# Patient Record
Sex: Male | Born: 1940
Health system: Southern US, Community
[De-identification: ages and names within clinical notes are randomized; demographics above are authoritative.]

## PROBLEM LIST (undated history)

## (undated) DIAGNOSIS — I509 Heart failure, unspecified: Secondary | ICD-10-CM

## (undated) DIAGNOSIS — N183 Chronic kidney disease, stage 3 unspecified: Secondary | ICD-10-CM

## (undated) DIAGNOSIS — Z9889 Other specified postprocedural states: Secondary | ICD-10-CM

## (undated) DIAGNOSIS — I5022 Chronic systolic (congestive) heart failure: Secondary | ICD-10-CM

## (undated) DIAGNOSIS — E039 Hypothyroidism, unspecified: Secondary | ICD-10-CM

## (undated) DIAGNOSIS — R188 Other ascites: Secondary | ICD-10-CM

## (undated) DIAGNOSIS — I2510 Atherosclerotic heart disease of native coronary artery without angina pectoris: Secondary | ICD-10-CM

## (undated) DIAGNOSIS — K429 Umbilical hernia without obstruction or gangrene: Secondary | ICD-10-CM

## (undated) DIAGNOSIS — J189 Pneumonia, unspecified organism: Secondary | ICD-10-CM

## (undated) DIAGNOSIS — I2699 Other pulmonary embolism without acute cor pulmonale: Secondary | ICD-10-CM

## (undated) DIAGNOSIS — J42 Unspecified chronic bronchitis: Secondary | ICD-10-CM

## (undated) DIAGNOSIS — I071 Rheumatic tricuspid insufficiency: Secondary | ICD-10-CM

## (undated) DIAGNOSIS — G14 Postpolio syndrome: Secondary | ICD-10-CM

## (undated) DIAGNOSIS — Z515 Encounter for palliative care: Secondary | ICD-10-CM

## (undated) DIAGNOSIS — E038 Other specified hypothyroidism: Secondary | ICD-10-CM

## (undated) DIAGNOSIS — R011 Cardiac murmur, unspecified: Secondary | ICD-10-CM

## (undated) DIAGNOSIS — E7800 Pure hypercholesterolemia, unspecified: Secondary | ICD-10-CM

## (undated) DIAGNOSIS — I4891 Unspecified atrial fibrillation: Secondary | ICD-10-CM

## (undated) DIAGNOSIS — E113599 Type 2 diabetes mellitus with proliferative diabetic retinopathy without macular edema, unspecified eye: Secondary | ICD-10-CM

## (undated) DIAGNOSIS — I10 Essential (primary) hypertension: Secondary | ICD-10-CM

## (undated) DIAGNOSIS — R7611 Nonspecific reaction to tuberculin skin test without active tuberculosis: Secondary | ICD-10-CM

## (undated) DIAGNOSIS — K7460 Unspecified cirrhosis of liver: Secondary | ICD-10-CM

## (undated) DIAGNOSIS — I340 Nonrheumatic mitral (valve) insufficiency: Secondary | ICD-10-CM

## (undated) DIAGNOSIS — E119 Type 2 diabetes mellitus without complications: Secondary | ICD-10-CM

## (undated) HISTORY — DX: Unspecified atrial fibrillation: I48.91

## (undated) HISTORY — DX: Unspecified cirrhosis of liver: K74.60

## (undated) HISTORY — DX: Postpolio syndrome: G14

## (undated) HISTORY — PX: TRANSTHORACIC ECHOCARDIOGRAM: SHX275

## (undated) HISTORY — PX: CORONARY ARTERY BYPASS GRAFT: SHX141

## (undated) HISTORY — PX: PARACENTESIS: SHX844

## (undated) HISTORY — PX: HYDROCELE EXCISION / REPAIR: SUR1145

## (undated) HISTORY — PX: CARDIAC CATHETERIZATION: SHX172

## (undated) HISTORY — DX: Chronic kidney disease, stage 3 (moderate): N18.3

## (undated) HISTORY — DX: Encounter for palliative care: Z51.5

## (undated) HISTORY — PX: RETINAL LASER PROCEDURE: SHX2339

## (undated) HISTORY — DX: Other specified postprocedural states: Z98.890

## (undated) HISTORY — DX: Other ascites: R18.8

## (undated) HISTORY — DX: Nonrheumatic mitral (valve) insufficiency: I34.0

## (undated) HISTORY — DX: Type 2 diabetes mellitus with proliferative diabetic retinopathy without macular edema, unspecified eye: E11.3599

## (undated) HISTORY — DX: Other specified hypothyroidism: E03.8

## (undated) HISTORY — DX: Chronic systolic (congestive) heart failure: I50.22

## (undated) HISTORY — PX: APPENDECTOMY: SHX54

## (undated) HISTORY — DX: Chronic kidney disease, stage 3 unspecified: N18.30

## (undated) HISTORY — DX: Rheumatic tricuspid insufficiency: I07.1

## (undated) HISTORY — PX: EXPLORATORY LAPAROTOMY: SUR591

## (undated) HISTORY — DX: Umbilical hernia without obstruction or gangrene: K42.9

## (undated) HISTORY — DX: Hypothyroidism, unspecified: E03.9

---
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
## 1982-10-21 HISTORY — PX: CERVICAL DISCECTOMY: SHX98

## 2003-02-18 ENCOUNTER — Encounter: Payer: Self-pay | Admitting: Emergency Medicine

## 2003-02-18 ENCOUNTER — Inpatient Hospital Stay (HOSPITAL_COMMUNITY): Admission: EM | Admit: 2003-02-18 | Discharge: 2003-02-22 | Payer: Self-pay | Admitting: Emergency Medicine

## 2003-02-20 ENCOUNTER — Encounter: Payer: Self-pay | Admitting: Cardiology

## 2003-10-22 HISTORY — PX: CATARACT EXTRACTION W/ INTRAOCULAR LENS  IMPLANT, BILATERAL: SHX1307

## 2004-03-09 ENCOUNTER — Encounter: Admission: RE | Admit: 2004-03-09 | Discharge: 2004-03-09 | Payer: Self-pay | Admitting: Cardiovascular Disease

## 2006-12-02 ENCOUNTER — Inpatient Hospital Stay (HOSPITAL_COMMUNITY): Admission: EM | Admit: 2006-12-02 | Discharge: 2006-12-05 | Payer: Self-pay | Admitting: Emergency Medicine

## 2007-06-05 ENCOUNTER — Encounter (HOSPITAL_COMMUNITY): Admission: RE | Admit: 2007-06-05 | Discharge: 2007-06-08 | Payer: Self-pay | Admitting: Cardiovascular Disease

## 2007-10-29 ENCOUNTER — Inpatient Hospital Stay (HOSPITAL_COMMUNITY): Admission: EM | Admit: 2007-10-29 | Discharge: 2007-10-31 | Payer: Self-pay | Admitting: Emergency Medicine

## 2010-03-01 ENCOUNTER — Encounter: Admission: RE | Admit: 2010-03-01 | Discharge: 2010-03-01 | Payer: Self-pay | Admitting: Cardiovascular Disease

## 2011-02-14 ENCOUNTER — Inpatient Hospital Stay (HOSPITAL_COMMUNITY)
Admission: AD | Admit: 2011-02-14 | Discharge: 2011-02-19 | DRG: 287 | Disposition: A | Discharge status: Home / Self Care | Payer: Medicare Other | Source: Ambulatory Visit | Attending: Cardiovascular Disease | Admitting: Cardiovascular Disease

## 2011-02-14 ENCOUNTER — Inpatient Hospital Stay (HOSPITAL_COMMUNITY): Payer: Medicare Other

## 2011-02-14 DIAGNOSIS — I428 Other cardiomyopathies: Secondary | ICD-10-CM | POA: Diagnosis present

## 2011-02-14 DIAGNOSIS — I509 Heart failure, unspecified: Secondary | ICD-10-CM | POA: Diagnosis present

## 2011-02-14 DIAGNOSIS — I2789 Other specified pulmonary heart diseases: Secondary | ICD-10-CM | POA: Diagnosis present

## 2011-02-14 DIAGNOSIS — I5023 Acute on chronic systolic (congestive) heart failure: Principal | ICD-10-CM | POA: Diagnosis present

## 2011-02-14 DIAGNOSIS — Z951 Presence of aortocoronary bypass graft: Secondary | ICD-10-CM

## 2011-02-14 DIAGNOSIS — E119 Type 2 diabetes mellitus without complications: Secondary | ICD-10-CM | POA: Diagnosis present

## 2011-02-14 DIAGNOSIS — I2510 Atherosclerotic heart disease of native coronary artery without angina pectoris: Secondary | ICD-10-CM | POA: Diagnosis present

## 2011-02-14 LAB — BRAIN NATRIURETIC PEPTIDE: Pro B Natriuretic peptide (BNP): 101 pg/mL — ABNORMAL HIGH (ref 0.0–100.0)

## 2011-02-14 LAB — DIFFERENTIAL
Basophils Relative: 1 % (ref 0–1)
Eosinophils Absolute: 0.2 10*3/uL (ref 0.0–0.7)
Eosinophils Relative: 4 % (ref 0–5)
Lymphs Abs: 0.8 10*3/uL (ref 0.7–4.0)
Monocytes Relative: 14 % — ABNORMAL HIGH (ref 3–12)
Neutrophils Relative %: 65 % (ref 43–77)

## 2011-02-14 LAB — COMPREHENSIVE METABOLIC PANEL
AST: 21 U/L (ref 0–37)
Albumin: 3.6 g/dL (ref 3.5–5.2)
BUN: 18 mg/dL (ref 6–23)
Calcium: 8.6 mg/dL (ref 8.4–10.5)
Chloride: 102 mEq/L (ref 96–112)
Creatinine, Ser: 1.2 mg/dL (ref 0.4–1.5)
Total Protein: 7.2 g/dL (ref 6.0–8.3)

## 2011-02-14 LAB — CARDIAC PANEL(CRET KIN+CKTOT+MB+TROPI)
CK, MB: 1.9 ng/mL (ref 0.3–4.0)
Total CK: 69 U/L (ref 7–232)

## 2011-02-14 LAB — CBC
MCH: 30.3 pg (ref 26.0–34.0)
MCV: 89.2 fL (ref 78.0–100.0)
Platelets: 115 10*3/uL — ABNORMAL LOW (ref 150–400)
RBC: 4.52 MIL/uL (ref 4.22–5.81)
RDW: 15.3 % (ref 11.5–15.5)

## 2011-02-14 LAB — MAGNESIUM: Magnesium: 2.1 mg/dL (ref 1.5–2.5)

## 2011-02-14 LAB — GLUCOSE, CAPILLARY: Glucose-Capillary: 141 mg/dL — ABNORMAL HIGH (ref 70–99)

## 2011-02-15 LAB — PROTIME-INR
INR: 1.14 (ref 0.00–1.49)
INR: 1.23 (ref 0.00–1.49)
Prothrombin Time: 14.8 seconds (ref 11.6–15.2)
Prothrombin Time: 15.7 seconds — ABNORMAL HIGH (ref 11.6–15.2)

## 2011-02-15 LAB — CBC
HCT: 38.2 % — ABNORMAL LOW (ref 39.0–52.0)
Hemoglobin: 13.2 g/dL (ref 13.0–17.0)
Hemoglobin: 13.3 g/dL (ref 13.0–17.0)
MCH: 29.8 pg (ref 26.0–34.0)
MCV: 87 fL (ref 78.0–100.0)
Platelets: 123 10*3/uL — ABNORMAL LOW (ref 150–400)
RBC: 4.43 MIL/uL (ref 4.22–5.81)
WBC: 5.3 10*3/uL (ref 4.0–10.5)
WBC: 6.6 10*3/uL (ref 4.0–10.5)

## 2011-02-15 LAB — CARDIAC PANEL(CRET KIN+CKTOT+MB+TROPI): Total CK: 51 U/L (ref 7–232)

## 2011-02-15 LAB — GLUCOSE, CAPILLARY
Glucose-Capillary: 119 mg/dL — ABNORMAL HIGH (ref 70–99)
Glucose-Capillary: 113 mg/dL — ABNORMAL HIGH (ref 70–99)
Glucose-Capillary: 98 mg/dL (ref 70–99)

## 2011-02-15 LAB — BASIC METABOLIC PANEL
BUN: 16 mg/dL (ref 6–23)
CO2: 24 mEq/L (ref 19–32)
Calcium: 8.4 mg/dL (ref 8.4–10.5)
Creatinine, Ser: 1.12 mg/dL (ref 0.4–1.5)
Creatinine, Ser: 1.07 mg/dL (ref 0.4–1.5)
GFR calc non Af Amer: >60 mL/min (ref >60)
GFR calc non Af Amer: >60 mL/min (ref >60)
Sodium: 132 mEq/L — ABNORMAL LOW (ref 135–145)

## 2011-02-15 LAB — POCT I-STAT 3, ART BLOOD GAS (G3+)
O2 Saturation: 98 %
pO2, Arterial: 99 mmHg (ref 80.0–100.0)

## 2011-02-15 LAB — POCT I-STAT 3, VENOUS BLOOD GAS (G3P V)
Acid-base deficit: 4 mmol/L — ABNORMAL HIGH (ref 0.0–2.0)
pCO2, Ven: 39.3 mmHg — ABNORMAL LOW (ref 45.0–50.0)
pO2, Ven: 32 mmHg (ref 30.0–45.0)

## 2011-02-16 LAB — GLUCOSE, CAPILLARY
Glucose-Capillary: 172 mg/dL — ABNORMAL HIGH (ref 70–99)
Glucose-Capillary: 199 mg/dL — ABNORMAL HIGH (ref 70–99)
Glucose-Capillary: 205 mg/dL — ABNORMAL HIGH (ref 70–99)

## 2011-02-16 LAB — CBC
Hemoglobin: 14.3 g/dL (ref 13.0–17.0)
MCHC: 34.5 g/dL (ref 30.0–36.0)
Platelets: 125 10*3/uL — ABNORMAL LOW (ref 150–400)
RDW: 15.1 % (ref 11.5–15.5)

## 2011-02-16 LAB — MAGNESIUM: Magnesium: 2.2 mg/dL (ref 1.5–2.5)

## 2011-02-16 LAB — BASIC METABOLIC PANEL
BUN: 20 mg/dL (ref 6–23)
Calcium: 8.7 mg/dL (ref 8.4–10.5)
Creatinine, Ser: 1.49 mg/dL (ref 0.4–1.5)
GFR calc non Af Amer: 47 mL/min — ABNORMAL LOW (ref >60)
Potassium: 4.8 mEq/L (ref 3.5–5.1)

## 2011-02-17 LAB — GLUCOSE, CAPILLARY: Glucose-Capillary: 166 mg/dL — ABNORMAL HIGH (ref 70–99)

## 2011-02-17 LAB — BASIC METABOLIC PANEL
BUN: 25 mg/dL — ABNORMAL HIGH (ref 6–23)
CO2: 26 mEq/L (ref 19–32)
Chloride: 101 mEq/L (ref 96–112)
Creatinine, Ser: 1.22 mg/dL (ref 0.4–1.5)
GFR calc Af Amer: >60 mL/min (ref >60)
Glucose, Bld: 158 mg/dL — ABNORMAL HIGH (ref 70–99)

## 2011-02-17 LAB — CBC
HCT: 40 % (ref 39.0–52.0)
Hemoglobin: 13.6 g/dL (ref 13.0–17.0)
MCH: 29.8 pg (ref 26.0–34.0)
MCHC: 34 g/dL (ref 30.0–36.0)
Platelets: 119 10*3/uL — ABNORMAL LOW (ref 150–400)
RBC: 4.57 MIL/uL (ref 4.22–5.81)
RDW: 15 % (ref 11.5–15.5)
WBC: 4.5 10*3/uL (ref 4.0–10.5)

## 2011-02-18 LAB — BASIC METABOLIC PANEL
Calcium: 8.5 mg/dL (ref 8.4–10.5)
Chloride: 97 mEq/L (ref 96–112)
Creatinine, Ser: 1.1 mg/dL (ref 0.4–1.5)
GFR calc Af Amer: >60 mL/min (ref >60)

## 2011-02-18 LAB — GLUCOSE, CAPILLARY
Glucose-Capillary: 141 mg/dL — ABNORMAL HIGH (ref 70–99)
Glucose-Capillary: 132 mg/dL — ABNORMAL HIGH (ref 70–99)
Glucose-Capillary: 146 mg/dL — ABNORMAL HIGH (ref 70–99)

## 2011-02-18 LAB — CBC
MCH: 29.6 pg (ref 26.0–34.0)
Platelets: 130 10*3/uL — ABNORMAL LOW (ref 150–400)
RBC: 4.25 MIL/uL (ref 4.22–5.81)

## 2011-02-19 LAB — BASIC METABOLIC PANEL
CO2: 25 mEq/L (ref 19–32)
Calcium: 8.7 mg/dL (ref 8.4–10.5)
GFR calc Af Amer: >60 mL/min (ref >60)
GFR calc non Af Amer: >60 mL/min (ref >60)
Potassium: 4.3 mEq/L (ref 3.5–5.1)
Sodium: 132 mEq/L — ABNORMAL LOW (ref 135–145)

## 2011-02-19 LAB — GLUCOSE, CAPILLARY: Glucose-Capillary: 127 mg/dL — ABNORMAL HIGH (ref 70–99)

## 2011-02-19 LAB — HEMOGLOBIN A1C: Mean Plasma Glucose: 197 mg/dL — ABNORMAL HIGH (ref <117)

## 2011-02-19 LAB — CBC
Hemoglobin: 13.3 g/dL (ref 13.0–17.0)
RBC: 4.43 MIL/uL (ref 4.22–5.81)

## 2011-02-20 NOTE — Discharge Summary (Signed)
NAMEMATHIAS, [name]             ACCOUNT NO.:  [id number]  MEDICAL RECORD NO.:  [id number]           PATIENT TYPE:  I  LOCATION:  4735                         FACILITY:  [facility]  PHYSICIAN:  [name], M.D.  DATE OF BIRTH:  08/17/1941  DATE OF ADMISSION:  02/14/2011 DATE OF DISCHARGE:  02/19/2011                              DISCHARGE SUMMARY   FINAL DIAGNOSES: 1. Acute on chronic biventricular systolic heart failure. 2. Dilated cardiomyopathy 3. Diabetes mellitus type 2. 4. Coronary artery disease. 5. Status post coronary artery bypass graft surgery.  DISCHARGE MEDICATIONS: 1. Carvedilol 3.125 mg 1 daily. 2. Lisinopril 2.5 mg 1 daily. 3. Metformin 5 mg 1/2 tablet twice daily. 4. Nystatin 100,000 units/grams powder apply topically as needed. 5. Glipizide XL 5 mg 1 daily. 6. Lasix 40 mg 1 twice daily. 7. KCl 10 mEq 1 daily.  DISCHARGE DIET:  Low-sodium heart-healthy diet and carbohydrate-modified diet, medium calorie.  DISCHARGE ACTIVITY:  The patient is to increase activity as tolerated.  CONDITION ON DISCHARGE:  Improved.  FOLLOWUP:  By Dr. Orpah Cobb in 2 weeks.  WOUND CARE INSTRUCTIONS:  The patient to notify right groin pain swelling or discharge.  HISTORY:  This is a 70 year old white male who presented with a 1-week history of gradual swelling of his lower extremity with shortness of breath.  The patient has a known history of dilated cardiomyopathy and had abnormal stress test along with the risk factors of diabetes and hypertension.  PHYSICAL EXAMINATION:  GENERAL:  The patient is averagely built, well- nourished white male in no acute distress. VITAL SIGNS:  Pulse 87, respirations 20, blood pressure 118/67, oxygen saturation 97% on room air, and temperature 97.5 degrees Fahrenheit. HEENT:  The patient is normocephalic, atraumatic with brown eyes. Sinuses nontender.  Wears reading glasses.  Wears upper and lower dentures. NECK:  Full JVD.  Mild  right-sided bruit.  Full range of motion of the neck LUNGS:  Clear bilaterally with basilar crackles posteriorly. HEART:  Normal S1-S2 with grade 3/6 systolic murmur. ABDOMEN:  Soft, nontender, left-sided hydrocele. EXTREMITIES:  No edema, cyanosis, or clubbing. CNS:  Cranial nerves grossly intact.  Bilateral equal grips, the patient is right-handed. SKIN:  Warm and dry.  LABORATORY DATA:  Normal hemoglobin/hematocrit, WBC count, and platelet count was slightly low at 115,000.  Sodium low at 132 and potassium 3.6. BUN and creatinine normal.  Sugar elevated at 138.  Magnesium normal.  B- natriuretic peptide borderline at 101.  CK-MB and troponin I negative x3.    EKG:  Sinus rhythm with possible anteroseptal wall MI and inferolateral  ischemia.    Cardiac catheterization showed severe three-vessel coronary artery disease,  severe LV systolic dysfunction, and occluded preradial bypass graft to  diagonal artery, patent free RIMA to intermediate artery, and native LIMA  to left anterior descending coronary artery, and elevated right-sided heart  pressures and also left-sided diastolic pressure.  HOSPITAL COURSE:  The patient was admitted to the Telemetry Unit. Clinically, he had congestive heart failure.  His cardiac enzymes were negative for myocardial infarction.  He underwent right and left heart catheterization that showed severe LV  systolic dysfunction along with moderate pulmonary hypertension and three-vessel coronary artery disease with a patent LIMA to LAD and free RIMA to intermediate branch.  The patient was given option to have his native three-vessel disease corrected by putting stent in the left main coronary artery and obtuse marginal branch artery and then have the LV function rechecked and consider ICD placement.  The patient declined both the procedures.  He wants to try alternative medicine for 1 month and he will have a followup by me in 2 weeks and in 1 month.   His carvedilol dose was decreased by 50% to accommodate occasional brady arrhythmias.  He was on a very low dose of lisinopril due to low blood pressure and metformin was added to improve his diabetic control.     [name], M.D.     ASK/MEDQ  D:  02/19/2011  T:  02/19/2011  Job:  [id number]  Electronically Signed by Orpah Cobb M.D. on 02/20/2011 08:29:38 AM

## 2011-02-20 NOTE — H&P (Signed)
NAME:  [name], [name] NO.:  [id number]  MEDICAL RECORD NO.:  [id number]           PATIENT TYPE:  I  LOCATION:  4735                         FACILITY:  [facility]  PHYSICIAN:  [name], M.D.  DATE OF BIRTH:  [birth year]  DATE OF ADMISSION:  02/14/2011 DATE OF DISCHARGE:                             HISTORY & PHYSICAL   CHIEF COMPLAINT:  Exertional dyspnea and leg swelling.  HISTORY OF PRESENT ILLNESS:  This 70 year old white male presented with a 1-week history of gradual swelling of his lower extremities with shortness of breath.  The patient had denied chest pain.  He has a known history of dilated cardiomyopathy and had abnormal stress test along with risk factors of diabetes and hypertension.  PAST MEDICAL HISTORY:  Positive for: 1. Diabetes mellitus treated with insulin and Glucophage. 2. Hypertension. 3. Coronary artery disease. 4. Arthritis.  PAST SURGICAL HISTORY:  Coronary artery bypass graft surgery in [place] in 1989, cardiac catheterization in 2000 followed by bypass surgery by Dr. Leo Rod in 2000 using a free LIMA to intermediate and left radial to posterior descending coronary artery and LIMA to LAD was patent from previous surgery.  MEDICATIONS: 1. Humulin 70/30 15-25 units subcutaneous daily. 2. Lasix 40 mg twice daily. 3. K-Dur 10 mEq daily. 4. Metoprolol 25 mg daily. 5. Lisinopril 2.5 mg daily. 6. Glucophage 500 mg one twice daily. 7. Glipizide 5 mg daily.  ALLERGIES:  SULFA.  FAMILY HISTORY:  Mom and dad died of accident.  One brother 80 year old has diabetes and hypertension.  One sister died at age of 54 of breast cancer.  PERSONAL HISTORY:  The patient is married x14 years.  Wife, [name] is 19 years old.  The patient has 3 sons and 1 daughter, youngest son died in auto accident in 24 at age of 44.  REVIEW OF SYSTEMS:  The patient denies recent weight gain, hearing loss, or vision changes.  He wears reading glasses  and partial upper and lower dentures.  The patient denies any history of chest pain, hemoptysis, GI bleed, kidney stones, strokes, seizures, or psychiatric admissions, also denies joint pains.  PHYSICAL EXAMINATION:  GENERAL:  The patient is averagely built, well- nourished white male in no acute distress. VITAL SIGNS:  His pulse is 87, respirations 20, blood pressure 118/67, oxygen saturation 97% on room air, and temperature 97.5 degrees Fahrenheit. HEENT:  The patient is normocephalic, atraumatic with brown eyes, sinuses nontender.  Wears reading glasses, partial upper and lower dentures.  Has 1 inch soft mobile swelling near hairline of left side of the forehead. NECK:  Full JVD.  Mild right-sided bruit.  Full range of motion of the neck. LUNGS:  Clear bilaterally with basal crackles posteriorly. HEART:  Normal S1 and S2, grade 3/6 systolic murmur. ABDOMEN:  Soft, nontender, left-sided hydrocele. EXTREMITIES:  No edema, cyanosis, or clubbing. CNS:  Cranial nerves grossly intact.  Bilateral equal grips.  The patient is right handed. SKIN:  Warm and dry.  LABORATORY DATA:  Normal hemoglobin, hematocrit, WBC count, and platelet count slightly low at 115,000.  Sodium low at 132, potassium 3.6, BUN 18, creatinine  1.2, sugar elevated at 138.  Magnesium 2.1, B-natriuretic peptide slightly high at 101.  CK-MB and troponin-I negative x1.  EKG; sinus rhythm with possible anteroseptal wall MI and inferolateral ischemia.  IMPRESSION: 1. Chest pain. 2. Coronary artery disease. 3. Diabetes mellitus type 2. 4. Hypertension. 5. Dilated cardiomyopathy. 6. Status post coronary artery bypass graft surgery.  PLAN:  Plan is to admit the patient to 4700 units.  Give IV Lasix. Consider right and left heart catheterization when the patient is more stable.     [name], M.D.     ASK/MEDQ  D:  02/14/2011  T:  02/15/2011  Job:  [id number]  Electronically Signed by Orpah Cobb M.D.  on 02/20/2011 08:25:58 AM

## 2011-03-05 NOTE — Discharge Summary (Signed)
NAMEMERTON, WADLOW NO.:  [id number]   MEDICAL RECORD NO.:  [id number]          PATIENT TYPE:  INP   LOCATION:  4735                         FACILITY:  [facility]   PHYSICIAN:  [name], M.D.  DATE OF BIRTH:  10/18/1941   DATE OF ADMISSION:  10/29/2007  DATE OF DISCHARGE:  10/31/2007                               DISCHARGE SUMMARY   ADMITTING PHYSICIAN:  [name], M.D.   FINAL DIAGNOSES:  1. Acute left heart failure.  2. Coronary artery disease.  3. Status post coronary artery bypass graft surgery x2.  4. Hypertension.  5. Diabetes mellitus type 2.   DISCHARGE MEDICATIONS:  1. Humulin 70/30 20 units in the morning subcutaneously.  2. Glucophage 500 mg one twice daily.  3. Lasix 40 mg one twice daily.  4. K-Dur 10 mEq one twice daily.  5. Metoprolol XL 25 mg one daily.  6. Lisinopril 2.5 mg one daily.   DISCHARGE ACTIVITY:  The patient to resume activities slowly.   DISCHARGE DIET:  Low sodium heart healthy diet with a 1500 mL fluid  restriction and moderate calorie carbohydrate modified diet.   FOLLOWUP:  By Dr. [name] in 1 week.  The patient is to call  [phone] for appointment.   CONDITION ON DISCHARGE:  Improved.   HISTORY:  This 70 year old white male with a known dilated  cardiomyopathy presented with shortness of breath.  The patient admitted  to dietary noncompliance over the holidays.  The patient has a past  history of diabetes mellitus, hypertension, coronary artery disease and  arthritis.   PHYSICAL EXAMINATION:  VITAL SIGNS:  Pulse 84, respirations 20, blood  pressure 153/71, temperature 98.1.  The patient is 5 feet 5 inches tall  and weighs approximately 170 pounds.  Oxygen saturation was 97% on 2  liters of oxygen.  GENERAL:  He is average build and nourished, in mild respiratory  distress.  HEENT:  The patient is normocephalic, atraumatic with brown eyes and has  bilateral lens implants.  NECK:  Positive JVD  and mild right-sided bruit.  Good range of motion of  the neck.  LUNGS:  Bilateral basal crackles.  HEART:  Normal S1, S2 with grade 3/6 systolic murmur at the left sternal  border.  ABDOMEN:  Soft, distended but nontender.  Has a persistent left-sided  hydrocele.  EXTREMITIES:  No edema, clubbing, cyanosis.  CNS:  Cranial nerves grossly intact.  The patient has bilaterally equal  grips.   LABORATORY DATA:  Normal WBC count, hemoglobin, hematocrit and platelet  counts.  Normal myoglobin, CK-MB and troponin I.  B natriuretic peptide  elevated at 423.  Subsequent B natriuretic peptide was down to 200  range.   Chest x-ray showed pulmonary edema with cardiac enlargement.   Echocardiogram showed mild to moderate left ventricular systolic  dysfunction with moderate to severe mitral regurgitation and mild  tricuspid regurgitation, dilated left atrium with calcification of the  mitral valve chordae.   HOSPITAL COURSE:  The patient was admitted to telemetry unit, myocardial  infarction was ruled out.  His B natriuretic peptide decreased by  50%  with 48 hours of diuresis.  The patient has personal work to catch up;  hence he was discharged home in satisfactory condition with a followup  by me in 1 week.      [name], M.D.  Electronically Signed     ASK/MEDQ  D:  10/31/2007  T:  10/31/2007  Job:  [id number]

## 2011-03-05 NOTE — H&P (Signed)
NAME:  [name], [name] NO.:  [id number]   MEDICAL RECORD NO.:  [id number]          PATIENT TYPE:  INP   LOCATION:  4735                         FACILITY:  [facility]   PHYSICIAN:  [name], M.D.  DATE OF BIRTH:  January 18, 1941   DATE OF ADMISSION:  10/29/2007  DATE OF DISCHARGE:                              HISTORY & PHYSICAL   HOSPITAL LOCATION:  4735, bed one.   DATE OF BIRTH:  07/03/1941.   CHIEF COMPLAINT:  Shortness of breath.   HISTORY OF PRESENT ILLNESS:  This 70 year old white male presented with  a one-day history of sudden onset of shortness of breath without any  cough, fever or chills.  The patient has known history of dilated  cardiomyopathy.   PAST MEDICAL HISTORY:  1. Diabetes mellitus, treated with insulin and Glucophage.  2. Hypertension.  3. Coronary artery disease.  4. Arthritis.   PAST SURGICAL HISTORY:  1. Coronary artery bypass graft surgery at [facility] in 1989.  2. Cardiac catheterization in 2000, followed by a bypass surgery by      Dr. Ranee Gosselin in 2000, using free LIMA to intermediate and left      radial to posterior descending coronary artery, and LIMA to LAD was      patent from previous surgery.   MEDICATIONS:  1. Humulin 70/30, 15 units daily.  2. Glucophage 500 mg twice daily.  3. Lasix 40 mg, one daily.  4. K-Dur 10 mEq, one daily.   ALLERGIES:  SULFA.   FAMILY HISTORY:  Noncontributory.   PERSONAL HISTORY:  The patient does not smoke or drink.  Lives alone.   PHYSICAL EXAMINATION:  VITAL SIGNS:  Pulse 84, respirations 20, blood  pressure 153/71, temperature 98.1.  The patient is 5 feet 5 inches tall  and weighs approximately 170 pounds.  Oxygen saturation was 97% on 2  liters of oxygen.  The patient is an averagely built and nourished white male in mild  respiratory distress.  HEENT: The patient is normocephalic, atraumatic.  Has brown eyes and has  bilateral lens implants.  NECK:  Positive JVD with  mild right-sided bruit.  Has good range of  motion of the neck.  LUNGS:  Revealed bilateral basilar crackles.  HEART:  Normal S1-S2 with grade 3/6 systolic murmur.  ABDOMEN:  Soft, distended but nontender.  Had a persistent left-sided  hydrocele.  EXTREMITIES:  No edema, cyanosis, or clubbing.  CNS: Cranial nerves grossly intact.  The patient has bilateral equal  grips.   LABORATORY DATA:  Normal WBC count, hemoglobin/hematocrit and platelet  count.  Normal myoglobin, CK-MB and troponin-I.  B-natruretic peptide  elevated at 423.  Chest x-ray showed pulmonary edema with cardiac  enlargement.   IMPRESSION:  1. Congestive heart failure/acute/left ventricular.  2. Coronary artery disease.  3. Coronary artery bypass graft surgery x2.  4. Hypertension.  5. Diabetes mellitus type 2.   PLAN:  Admit the patient to telemetry unit.  Start IV Lasix and  supplemental oxygen and home medications.  [name], M.D.  Electronically Signed  ASK/MEDQ  D:  10/29/2007  T:  10/30/2007  Job:  [id number]

## 2011-03-08 NOTE — Discharge Summary (Signed)
NAME:  [name], [name]                       ACCOUNT NO.:  [id number]   MEDICAL RECORD NO.:  [id number]                   PATIENT TYPE:  INP   LOCATION:  0343                                 FACILITY:  [facility]   PHYSICIAN:  [name], M.D.               DATE OF BIRTH:  Nov 27, 1940   DATE OF ADMISSION:  02/18/2003  DATE OF DISCHARGE:  02/22/2003                                 DISCHARGE SUMMARY   PRINCIPAL DIAGNOSES:  1. Congestive heart failure.  2. Pneumonia.  3. Diabetes mellitus type 2.  4. Coronary atherosclerosis of native vessel.  5. Hypertension.  6. Hyperlipidemia.  7. Bronchitis.  8. Old myocardial infarction.  9. Aortocoronary bypass surgery.   DISCHARGE MEDICATIONS:  1. Altace 2.5 mg 1 twice daily.  2. Toprol XL 25 mg 1 daily.  3. K-Dur 20 mEq daily.  4. Benicar 20 mg daily.  5. Lanoxin 0.25 mg 1 daily.  6. Aspirin 81 mg 1 daily.  7. Combivent 2 puffs four times daily.  8. Crestor 10 mg 1 daily.  9. Tequin 400 mg daily for 10 days.  10.      Lasix 40 mg 1 daily.  11.      Humulin 70/30 20 units subcutaneously in the morning.   DIET:  Low-fat, low-salt diet.   ACTIVITY:  The patient will record daily weight. Stop any activity that  causes chest pain, shortness of breath, dizziness, or sweating. The patient  will continue to follow the ___________ book and call the physician if he  has 3-4 pound weight gain in 1-2 days or 2 pound weight gain overnight,  shortness of breath, swelling, or increased orthopnea. The patient was also  advised to check blood sugars twice daily and bring the record.   FOLLOW UP:  By me in one week. Patient to call [phone] for appointment.   HISTORY OF PRESENT ILLNESS:  This 70 year old white male presented with  dyspnea and fatigue for 2-3 days with a past history of congestive heart  failure, diabetes, hypertension, hyperlipidemia and coronary artery bypass  grafting x2 in 1989 and 2000.   PHYSICAL EXAMINATION:  HEENT:  Grossly unremarkable.  NECK: Positive JVD.  LUNGS: Bilateral basilar crackles.  HEART: Normal S1 and S2 with a 2/6 systolic murmur.  ABDOMEN: Soft.  EXTREMITIES: 1-2+ edema.  CNS: Grossly intact.   LABORATORY DATA:  Hemoglobin 10.4, hematocrit 31.9.  WBC count of 8.  Platelet count of 135,000. Electrolytes normal. Sugar slightly elevated at  157 on Feb 19, 2003. Liver enzymes normal. Hemoglobin A1C 7.8. Normal less  than 7. CK MB, troponin I borderline on Carafate. Otherwise, unremarkable.  Subsequent CK MB and troponin I showing downward trend. Lipid profile  unremarkable with total cholesterol of 150, triglyceride of 102, HDL  cholesterol of 60 and LDL cholesterol of 70. Urinalysis unremarkable except  for moderate hemoglobin.   EKG shows sinus rhythm with septal infarct  and prolonged QT. Chest x-ray  showed cardiomegaly with vascular congestion and small effusions.   HOSPITAL COURSE:  The patient was admitted to telemetry unit. He was treated  with IV diuretic, antibiotic for two weeks. He responded well. He also  received Lovenox 80 mg subcutaneous twice daily. His blood sugars were on  the lower side and his Avandia was held. He showed minimal abnormal cardiac  enzymes. On Feb 21, 2003, his Lasix dose was converted to oral. He was given  option for further cardiac evaluation after his pneumonia was well  controlled. The patient insisted on discharge home on Feb 22, 2003. Hence,  his additional workup will be done on an outpatient basis. He was sent home  on oral antibiotics, ACE inhibitors, beta blocker, diuretic, and he was  started on pressor 10 mg 1 daily along with his insulin and Combivent.   DISPOSITION:  He was discharged in stable condition and will be followed by  me in one week.                                               [name], M.D.    ASK/MEDQ  D:  04/18/2003  T:  04/18/2003  Job:  [id number]

## 2011-03-08 NOTE — Discharge Summary (Signed)
NAMEBENYAMIN, [name]             ACCOUNT NO.:  [id number]   MEDICAL RECORD NO.:  [id number]          PATIENT TYPE:  INP   LOCATION:  3714                         FACILITY:  [facility]   PHYSICIAN:  [name], M.D.  DATE OF BIRTH:  September 16, 1941   DATE OF ADMISSION:  12/01/2006  DATE OF DISCHARGE:  12/05/2006                               DISCHARGE SUMMARY   PRINCIPAL DIAGNOSES:  1. Bilateral pneumonia.  2. Coronary artery disease.  3. Coronary bypass graft surgery x2.  4. Hypertension.  5. Diabetes mellitus.   DISCHARGE MEDICATIONS:  1. Aspirin 81 mg one daily.  2. Lasix 40 mg one daily.  3. Potassium 10 mEq one daily.  4. Glucophage 500 mg one twice daily.  5. Glucotrol XL 5 mg daily.  6. Humulin 70/30 15-25 units subcutaneously daily as directed.  7. Cipro 500 mg one twice daily for 1 week.  8. Albuterol metered-dose inhaler 2 puffs four times daily as needed.  9. Tylenol 325 mg four times daily as needed.  10.Robitussin-DM two teaspoons 4 times daily times x1 week, then as      needed.  11.Atacand 4 mg one daily.  12.Toprol XL 12.5 mg and to break it half of 25 mg daily.  13.Discharge activity as tolerated.   DISCHARGE DIET:  Low-fat, low-salt, heart-healthy diet.   SPECIAL INSTRUCTIONS:  The patient to bring blood sugar level checks to  the office.   FOLLOW-UP:  By Dr. Orpah Cobb in 2 weeks.  The patient to call [phone] for appointment.   CONDITION ON DISCHARGE:  Improved.   HISTORY:  This 70 year old white male presented with a 2-D history of  cough and shortness of breath without fever or chills.   PHYSICAL EXAMINATION:  VITAL SIGNS:  Pulse 98, respirations 16, blood  732 x 77, temperature 97.6.  The patient is 5 feet 5 inches tall.  Weight approximately 170 pounds, oxygen saturation 95% room air.  HEENT:  The patient is normocephalic, atraumatic with brown eyes,  bilateral lens implants.  NECK:  No JVD, mild right-sided bruit, good range of motion of  neck.  LUNGS:  Bilateral rhonchi.  HEART:  Normal S1-S2 with grade 3/6 systolic murmur.  ABDOMEN:  Soft, distended but nontender.  EXTREMITIES:  No edema, cyanosis, clubbing.  CNS:  Cranial nerves grossly intact and bilaterally full grips.   LABORATORY DATA:  Normal electrolytes, BUN 25, glucose elevated at 184,  creatinine 1.1, WBC count slightly high at 10,100, hemoglobin 12.7,  hematocrit 37, platelet count normal.  B natriuretic peptide slightly  high at 365.   Chest x-ray showed a bilateral airspace disease.   EKG sinus rhythm with nonspecific ST-T changes and intraventricular  conduction delay.   HOSPITAL COURSE:  The patient was admitted to telemetry unit.  He was  given IV antibiotic Rocephin and p.o. antibiotic Zithromax, along with  breathing treatment and home medications.  His condition improved in 72  hours of hospitalization, after which he was able to ambulate without  shortness of breath.  His home medications were adjusted, and his blood  sugars were controlled with  addition of Glucotrol and insulin, and he  was discharged home in satisfactory condition with follow-up by me in 2  weeks.      [name], M.D.  Electronically Signed     ASK/MEDQ  D:  12/05/2006  T:  12/05/2006  Job:  [id number]

## 2011-03-08 NOTE — H&P (Signed)
NAMEJOFFRE, LUCKS NO.:  [id number]   MEDICAL RECORD NO.:  [id number]          PATIENT TYPE:  INP   LOCATION:  3714                         FACILITY:  [facility]   PHYSICIAN:  [name], M.D.  DATE OF BIRTH:  1941-04-13   DATE OF ADMISSION:  12/01/2006  DATE OF DISCHARGE:                              HISTORY & PHYSICAL   CHIEF COMPLAINT:  Shortness of breath with activity.   HISTORY OF PRESENT ILLNESS:  This 70 year old white male presented with  a 2-day history of cough and shortness of breath without fever or  chills.   PAST MEDICAL HISTORY:  1. Type 2 diabetes, treated with insulin.  2. High blood pressure.  3. Coronary artery disease.  4. Arthritis.   PAST SURGICAL HISTORY:  1. Coronary artery bypass graft surgery at [facility] in 1989.  2. After repeat cardiac catheterization in 2000, he had another bypass      surgery done in August 2000 by Dr. Ranee Gosselin, using free LIMA to      intermediate and left radial to posterior descending coronary      arteries, and LIMA to LAD was patent from previous study.   CURRENT MEDICATIONS:  1. Humulin 70/30, 15-25 units daily.  2. Glucophage 500 mg twice daily.  3. Lasix 40 mg 1 daily.  4. K-Dur 10 mEq 1 daily.  5. Toprol-XL 12.5 mg daily.  6. Atacand 4 mg daily.   ALLERGIES:  SULFA.   FAMILY HISTORY:  Noncontributory.   PERSONAL HISTORY:  The patient does not smoke or drink.   PHYSICAL EXAMINATION:  VITAL SIGNS:  Pulse 98, respirations 16, blood  pressure 132/77, temperature 97.6, the patient is 5 feet 5 inches tall,  weighs approximately 170 pounds, oxygen saturation 95% on room air.  HEENT: The patient is normocephalic, atraumatic.  He has brown eyes, and  has bilateral lens implants.  NECK:  No JVD.  Mild right-sided bruit.  Good range of motion.  LUNGS:  Bilateral rhonchi.  HEART:  Normal S1, S2, with grade 3/6 systolic murmur.  ABDOMEN:  Soft, distended, but nontender.  Persistent  left-sided  hydrocele.  EXTREMITIES:  No edema, cyanosis, or clubbing.  CNS:  Cranial nerves grossly intact, with bilaterally equal grips.   LABORATORY DATA:  Normal electrolytes.  BUN 25, glucose elevated at 184,  creatinine 1.1.  WBC count borderline at 10,100, hemoglobin 12.7,  hematocrit 37, platelet count 167,000.  B natruretic peptide at 365.  Chest x-ray:  Bilateral air space disease.  EKG:  Sinus rhythm, with  nonspecific ST-T changes, and interventricular conduction delay.   IMPRESSION:  1. Bilateral pneumonia.  2. Coronary artery disease.  3. Coronary artery bypass graft surgery x2.  4. Hypertension.  5. Diabetes mellitus.   PLAN:  To admit the patient to telemetry unit.  Start IV Rocephin and  p.o. Zithromax. Give supplemental oxygen and breathing treatments.      [name], M.D.  Electronically Signed     ASK/MEDQ  D:  12/02/2006  T:  12/03/2006  Job:  [id number]

## 2011-07-11 LAB — BASIC METABOLIC PANEL
CO2: 27
CO2: 32
Calcium: 9.2
GFR calc Af Amer: 39 — ABNORMAL LOW
GFR calc Af Amer: >60
GFR calc non Af Amer: 33 — ABNORMAL LOW
Glucose, Bld: 120 — ABNORMAL HIGH
Glucose, Bld: 168 — ABNORMAL HIGH
Potassium: 3.5
Potassium: 3.5
Sodium: 135
Sodium: 142

## 2011-07-11 LAB — I-STAT 8, (EC8 V) (CONVERTED LAB)
Bicarbonate: 24.9 — ABNORMAL HIGH
Glucose, Bld: 279 — ABNORMAL HIGH
Hemoglobin: 15
Operator id: 294501
Sodium: 136
TCO2: 26

## 2011-07-11 LAB — DIFFERENTIAL
Basophils Absolute: 0.1
Eosinophils Absolute: 0.2
Eosinophils Relative: 2
Lymphocytes Relative: 13
Lymphs Abs: 1.3
Monocytes Absolute: 0.8
Monocytes Relative: 8
Neutro Abs: 6.9
Neutrophils Relative %: 79 — ABNORMAL HIGH

## 2011-07-11 LAB — CBC
MCHC: 34
Platelets: 187
Platelets: 203
RBC: 4.62
RDW: 13.9

## 2011-07-11 LAB — CARDIAC PANEL(CRET KIN+CKTOT+MB+TROPI)
CK, MB: 3.2
CK, MB: 4.4 — ABNORMAL HIGH
Relative Index: 2
Relative Index: 2.3
Total CK: 137
Total CK: 154

## 2011-07-11 LAB — POCT CARDIAC MARKERS: Troponin i, poc: <0.05

## 2011-07-11 LAB — COMPREHENSIVE METABOLIC PANEL
ALT: 21
AST: 26
Albumin: 4.6
CO2: 29
Calcium: 9.8
GFR calc Af Amer: >60
Sodium: 142
Total Protein: 8.6 — ABNORMAL HIGH

## 2011-07-11 LAB — B-NATRIURETIC PEPTIDE (CONVERTED LAB)
Pro B Natriuretic peptide (BNP): 423 — ABNORMAL HIGH
Pro B Natriuretic peptide (BNP): 477 — ABNORMAL HIGH

## 2011-07-11 LAB — HEMOGLOBIN A1C: Mean Plasma Glucose: 215

## 2011-07-11 LAB — POCT I-STAT CREATININE: Operator id: 294501

## 2011-10-22 DIAGNOSIS — I071 Rheumatic tricuspid insufficiency: Secondary | ICD-10-CM

## 2011-10-22 DIAGNOSIS — I340 Nonrheumatic mitral (valve) insufficiency: Secondary | ICD-10-CM

## 2011-10-22 HISTORY — DX: Nonrheumatic mitral (valve) insufficiency: I34.0

## 2011-10-22 HISTORY — DX: Rheumatic tricuspid insufficiency: I07.1

## 2011-10-31 ENCOUNTER — Inpatient Hospital Stay (HOSPITAL_COMMUNITY)
Admission: AD | Admit: 2011-10-31 | Discharge: 2011-11-05 | DRG: 292 | Disposition: A | Discharge status: Home / Self Care | Payer: Medicare Other | Source: Ambulatory Visit | Attending: Cardiovascular Disease | Admitting: Cardiovascular Disease

## 2011-10-31 ENCOUNTER — Encounter (HOSPITAL_COMMUNITY): Payer: Self-pay | Admitting: Cardiovascular Disease

## 2011-10-31 DIAGNOSIS — I10 Essential (primary) hypertension: Secondary | ICD-10-CM | POA: Diagnosis present

## 2011-10-31 DIAGNOSIS — I420 Dilated cardiomyopathy: Secondary | ICD-10-CM | POA: Diagnosis present

## 2011-10-31 DIAGNOSIS — I5023 Acute on chronic systolic (congestive) heart failure: Principal | ICD-10-CM | POA: Diagnosis present

## 2011-10-31 DIAGNOSIS — I509 Heart failure, unspecified: Secondary | ICD-10-CM | POA: Diagnosis present

## 2011-10-31 DIAGNOSIS — I428 Other cardiomyopathies: Secondary | ICD-10-CM | POA: Diagnosis present

## 2011-10-31 DIAGNOSIS — I2510 Atherosclerotic heart disease of native coronary artery without angina pectoris: Secondary | ICD-10-CM | POA: Diagnosis present

## 2011-10-31 DIAGNOSIS — R188 Other ascites: Secondary | ICD-10-CM | POA: Diagnosis present

## 2011-10-31 DIAGNOSIS — J449 Chronic obstructive pulmonary disease, unspecified: Secondary | ICD-10-CM | POA: Diagnosis present

## 2011-10-31 DIAGNOSIS — E119 Type 2 diabetes mellitus without complications: Secondary | ICD-10-CM | POA: Diagnosis present

## 2011-10-31 DIAGNOSIS — Z951 Presence of aortocoronary bypass graft: Secondary | ICD-10-CM

## 2011-10-31 DIAGNOSIS — J4489 Other specified chronic obstructive pulmonary disease: Secondary | ICD-10-CM | POA: Diagnosis present

## 2011-10-31 HISTORY — DX: Pneumonia, unspecified organism: J18.9

## 2011-10-31 HISTORY — DX: Other pulmonary embolism without acute cor pulmonale: I26.99

## 2011-10-31 HISTORY — DX: Heart failure, unspecified: I50.9

## 2011-10-31 HISTORY — DX: Nonspecific reaction to tuberculin skin test without active tuberculosis: R76.11

## 2011-10-31 HISTORY — DX: Atherosclerotic heart disease of native coronary artery without angina pectoris: I25.10

## 2011-10-31 HISTORY — DX: Pure hypercholesterolemia, unspecified: E78.00

## 2011-10-31 HISTORY — DX: Essential (primary) hypertension: I10

## 2011-10-31 LAB — DIFFERENTIAL
Basophils Relative: 1 % (ref 0–1)
Eosinophils Absolute: 0.3 10*3/uL (ref 0.0–0.7)
Eosinophils Relative: 4 % (ref 0–5)
Lymphs Abs: 1 10*3/uL (ref 0.7–4.0)
Monocytes Absolute: 0.8 10*3/uL (ref 0.1–1.0)
Monocytes Relative: 12 % (ref 3–12)

## 2011-10-31 LAB — CBC
Hemoglobin: 15.2 g/dL (ref 13.0–17.0)
MCH: 30.5 pg (ref 26.0–34.0)
MCHC: 34.2 g/dL (ref 30.0–36.0)
MCV: 89.2 fL (ref 78.0–100.0)
RBC: 4.98 MIL/uL (ref 4.22–5.81)

## 2011-10-31 LAB — HEMOGLOBIN A1C
Hgb A1c MFr Bld: 7.7 % — ABNORMAL HIGH (ref <5.7)
Mean Plasma Glucose: 174 mg/dL — ABNORMAL HIGH (ref <117)

## 2011-10-31 LAB — COMPREHENSIVE METABOLIC PANEL
ALT: 10 U/L (ref 0–53)
Albumin: 3.3 g/dL — ABNORMAL LOW (ref 3.5–5.2)
Alkaline Phosphatase: 86 U/L (ref 39–117)
BUN: 28 mg/dL — ABNORMAL HIGH (ref 6–23)
Chloride: 101 mEq/L (ref 96–112)
Glucose, Bld: 120 mg/dL — ABNORMAL HIGH (ref 70–99)
Potassium: 3.6 mEq/L (ref 3.5–5.1)
Sodium: 137 mEq/L (ref 135–145)
Total Bilirubin: 1.6 mg/dL — ABNORMAL HIGH (ref 0.3–1.2)
Total Protein: 7 g/dL (ref 6.0–8.3)

## 2011-10-31 LAB — PROTIME-INR: Prothrombin Time: 17.3 seconds — ABNORMAL HIGH (ref 11.6–15.2)

## 2011-10-31 LAB — GLUCOSE, CAPILLARY: Glucose-Capillary: 151 mg/dL — ABNORMAL HIGH (ref 70–99)

## 2011-10-31 MED ORDER — SODIUM CHLORIDE 0.9 % IJ SOLN
3.0000 mL | Freq: Two times a day (BID) | INTRAMUSCULAR | Status: DC
  Administered 2011-10-31 – 2011-11-04 (×9): 3 mL via INTRAVENOUS

## 2011-10-31 MED ORDER — METOPROLOL SUCCINATE ER 25 MG PO TB24
25.0000 mg | ORAL_TABLET | Freq: Every day | ORAL | Status: DC
  Administered 2011-11-01 – 2011-11-04 (×4): 25 mg via ORAL
  Filled 2011-10-31 (×8): qty 1

## 2011-10-31 MED ORDER — GLIPIZIDE ER 5 MG PO TB24
5.0000 mg | ORAL_TABLET | Freq: Every day | ORAL | Status: DC
  Administered 2011-11-02 – 2011-11-05 (×4): 5 mg via ORAL
  Filled 2011-10-31 (×6): qty 1

## 2011-10-31 MED ORDER — SPIRONOLACTONE 25 MG PO TABS
25.0000 mg | ORAL_TABLET | Freq: Every day | ORAL | Status: DC
  Administered 2011-11-01 – 2011-11-03 (×3): 25 mg via ORAL
  Filled 2011-10-31 (×5): qty 1

## 2011-10-31 MED ORDER — ONDANSETRON HCL 4 MG/2ML IJ SOLN
4.0000 mg | Freq: Four times a day (QID) | INTRAMUSCULAR | Status: DC | PRN
  Administered 2011-11-02 – 2011-11-04 (×3): 4 mg via INTRAVENOUS
  Filled 2011-10-31 (×3): qty 2

## 2011-10-31 MED ORDER — ENOXAPARIN SODIUM 40 MG/0.4ML ~~LOC~~ SOLN
40.0000 mg | SUBCUTANEOUS | Status: DC
  Administered 2011-10-31 – 2011-11-04 (×5): 40 mg via SUBCUTANEOUS
  Filled 2011-10-31 (×6): qty 0.4

## 2011-10-31 MED ORDER — INSULIN ASPART PROT & ASPART (70-30 MIX) 100 UNIT/ML ~~LOC~~ SUSP
10.0000 [IU] | Freq: Two times a day (BID) | SUBCUTANEOUS | Status: DC
  Administered 2011-10-31: 10 [IU] via SUBCUTANEOUS
  Filled 2011-10-31: qty 3

## 2011-10-31 MED ORDER — POTASSIUM CHLORIDE CRYS ER 10 MEQ PO TBCR
10.0000 meq | EXTENDED_RELEASE_TABLET | Freq: Two times a day (BID) | ORAL | Status: DC
  Administered 2011-10-31 – 2011-11-03 (×7): 10 meq via ORAL
  Filled 2011-10-31 (×9): qty 1

## 2011-10-31 MED ORDER — LISINOPRIL 2.5 MG PO TABS
2.5000 mg | ORAL_TABLET | Freq: Every day | ORAL | Status: DC
  Administered 2011-11-01 – 2011-11-02 (×2): 2.5 mg via ORAL
  Filled 2011-10-31 (×4): qty 1

## 2011-10-31 MED ORDER — FUROSEMIDE 10 MG/ML IJ SOLN
40.0000 mg | Freq: Once | INTRAMUSCULAR | Status: AC
  Administered 2011-10-31: 40 mg via INTRAVENOUS
  Filled 2011-10-31: qty 4

## 2011-10-31 MED ORDER — ACETAMINOPHEN 325 MG PO TABS
650.0000 mg | ORAL_TABLET | ORAL | Status: DC | PRN
  Filled 2011-10-31: qty 2

## 2011-10-31 MED ORDER — SODIUM CHLORIDE 0.9 % IJ SOLN: 3.0000 mL | INTRAMUSCULAR | Status: DC | PRN

## 2011-10-31 MED ORDER — METFORMIN HCL 500 MG PO TABS
500.0000 mg | ORAL_TABLET | Freq: Two times a day (BID) | ORAL | Status: DC
  Administered 2011-10-31 – 2011-11-03 (×6): 500 mg via ORAL
  Filled 2011-10-31 (×10): qty 1

## 2011-10-31 MED ORDER — SODIUM CHLORIDE 0.9 % IV SOLN: 250.0000 mL | INTRAVENOUS | Status: DC | PRN

## 2011-10-31 MED ORDER — INSULIN ASPART 100 UNIT/ML ~~LOC~~ SOLN
0.0000 [IU] | Freq: Three times a day (TID) | SUBCUTANEOUS | Status: DC
  Administered 2011-11-01: 5 [IU] via SUBCUTANEOUS
  Administered 2011-11-02: 2 [IU] via SUBCUTANEOUS
  Administered 2011-11-03: 5 [IU] via SUBCUTANEOUS
  Administered 2011-11-03: 2 [IU] via SUBCUTANEOUS
  Filled 2011-10-31 (×2): qty 3

## 2011-10-31 NOTE — H&P (Signed)
[name] is an 71 y.o. male.   Chief Complaint: Shortness of breath and abdominal swelling HPI: 71 years old white male with 1 week history of progressive abdominal swelling and shortness of breath and weight gain. He has chronic history of dilated cardiomyopathy, DM, II and COPD  Past Medical History  Diagnosis Date  . Shortness of breath   . Coronary artery disease   . Hypertension   . CHF (congestive heart failure)   . COPD (chronic obstructive pulmonary disease)   . Diabetes mellitus     Past Surgical History  Procedure Date  . Coronary artery bypass graft   . Cardiac catheterization   . Cataract extraction, bilateral 2005  . Retinal laser procedure     History reviewed. No pertinent family history. Social History:  reports that he quit smoking about 38 years ago. His smoking use included Cigarettes. He has a 15 pack-year smoking history. He does not have any smokeless tobacco history on file. He reports that he does not drink alcohol or use illicit drugs.  Allergies:  Allergies  Allergen Reactions  . Sulfa Antibiotics     Medications Prior to Admission  Medication Dose Route Frequency Provider Last Rate Last Dose  . 0.9 %  sodium chloride infusion  250 mL Intravenous PRN [name], MD      . acetaminophen (TYLENOL) tablet 650 mg  650 mg Oral Q4H PRN [name], MD      . enoxaparin (LOVENOX) injection 40 mg  40 mg Subcutaneous Q24H [name], MD      . furosemide (LASIX) injection 40 mg  40 mg Intravenous Once [name], MD      . glipiZIDE (GLUCOTROL XL) 24 hr tablet 5 mg  5 mg Oral Q breakfast [name], MD      . insulin aspart (novoLOG) injection 0-15 Units  0-15 Units Subcutaneous TID WC [name], MD      . insulin aspart protamine-insulin aspart (NOVOLOG 70/30) injection 10 Units  10 Units Subcutaneous BID WC [name], MD      . lisinopril (PRINIVIL,ZESTRIL) tablet 2.5 mg  2.5 mg Oral Daily [name], MD      .  metFORMIN (GLUCOPHAGE) tablet 500 mg  500 mg Oral BID [name], MD      . metoprolol succinate (TOPROL-XL) 24 hr tablet 25 mg  25 mg Oral Daily [name], MD      . ondansetron (ZOFRAN) injection 4 mg  4 mg Intravenous Q6H PRN [name], MD      . potassium chloride (K-DUR,KLOR-CON) CR tablet 10 mEq  10 mEq Oral BID [name], MD      . sodium chloride 0.9 % injection 3 mL  3 mL Intravenous Q12H [name], MD      . sodium chloride 0.9 % injection 3 mL  3 mL Intravenous PRN [name], MD      . spironolactone (ALDACTONE) tablet 25 mg  25 mg Oral Daily [name], MD       No current outpatient prescriptions on file as of 10/31/2011.    Results for orders placed during the hospital encounter of 10/31/11 (from the past 48 hour(Mueller))  GLUCOSE, CAPILLARY     Status: Abnormal   Collection Time   10/31/11  4:08 PM      Component Value Range Comment   Glucose-Capillary 151 (*) 70 - 99 (mg/dL)  Comment 1 Notify RN     CBC     Status: Normal   Collection Time   10/31/11  5:38 PM      Component Value Range Comment   WBC 6.7  4.0 - 10.5 (K/uL)    RBC 4.98  4.22 - 5.81 (MIL/uL)    Hemoglobin 15.2  13.0 - 17.0 (g/dL)    HCT 46.9  62.9 - 52.8 (%)    MCV 89.2  78.0 - 100.0 (fL)    MCH 30.5  26.0 - 34.0 (pg)    MCHC 34.2  30.0 - 36.0 (g/dL)    RDW 41.3  24.4 - 01.0 (%)    Platelets 182  150 - 400 (K/uL)   DIFFERENTIAL     Status: Normal   Collection Time   10/31/11  5:38 PM      Component Value Range Comment   Neutrophils Relative 69  43 - 77 (%)    Neutro Abs 4.6  1.7 - 7.7 (K/uL)    Lymphocytes Relative 14  12 - 46 (%)    Lymphs Abs 1.0  0.7 - 4.0 (K/uL)    Monocytes Relative 12  3 - 12 (%)    Monocytes Absolute 0.8  0.1 - 1.0 (K/uL)    Eosinophils Relative 4  0 - 5 (%)    Eosinophils Absolute 0.3  0.0 - 0.7 (K/uL)    Basophils Relative 1  0 - 1 (%)    Basophils Absolute 0.1  0.0 - 0.1 (K/uL)    No results found.  Review of Systems  Constitutional:  Positive for malaise/fatigue. Negative for fever, chills, weight loss and diaphoresis.  HENT: Negative for hearing loss, nosebleeds, sore throat and ear discharge.   Eyes: Negative for blurred vision, pain and discharge.  Respiratory: Positive for shortness of breath. Negative for cough and sputum production.   Cardiovascular: Positive for orthopnea. Negative for chest pain and leg swelling.  Gastrointestinal: Positive for abdominal pain. Negative for heartburn, nausea and constipation.  Genitourinary: Negative for dysuria and urgency.  Musculoskeletal: Positive for joint pain. Negative for myalgias and back pain.  Skin: Negative for rash.  Neurological: Positive for weakness. Negative for dizziness, tremors, focal weakness, seizures and headaches.  Psychiatric/Behavioral: Positive for depression. Negative for suicidal ideas, memory loss and substance abuse. The patient is not nervous/anxious and does not have insomnia.     Blood pressure 103/75, pulse 81, temperature 97.3 F (36.3 C), temperature source Oral, resp. rate 20, height 5\' 6"  (1.676 m), weight 74.4 kg (164 lb 0.4 oz), SpO2 100.00%. Physical Exam  Vitals reviewed. Constitutional: He is oriented to person, place, and time. He appears well-developed and well-nourished.  HENT:  Head: Normocephalic and atraumatic.  Mouth/Throat: No oropharyngeal exudate.  Eyes: Conjunctivae are normal. Pupils are equal, round, and reactive to light. Right eye exhibits no discharge. No scleral icterus.  Neck: Normal range of motion. JVD present. No tracheal deviation present. No thyromegaly present.  Cardiovascular: Normal rate and regular rhythm.   Murmur heard. Respiratory: He is in respiratory distress. He has no rales.  GI: Soft. He exhibits distension. There is no tenderness. There is no rebound.  Musculoskeletal: He exhibits no edema and no tenderness.  Lymphadenopathy:    He has no cervical adenopathy.  Neurological: He is alert and  oriented to person, place, and time. He displays normal reflexes. No cranial nerve deficit.  Skin: Skin is warm and dry.  Psychiatric: He has a normal mood and affect.  Assessment/Plan Acute on chronic left heart systolic failure Ascites DM, II Dil. Cardiomyopathy  Plan: IV Lasix, spironolactone, Home medications, Radiology consult for US guided paracentesis  [name] 10/31/2011, 6:00 PM

## 2011-11-01 ENCOUNTER — Inpatient Hospital Stay (HOSPITAL_COMMUNITY): Payer: Medicare Other

## 2011-11-01 LAB — LACTATE DEHYDROGENASE, PLEURAL OR PERITONEAL FLUID: LD, Fluid: 108 U/L — ABNORMAL HIGH (ref 3–23)

## 2011-11-01 LAB — CARDIAC PANEL(CRET KIN+CKTOT+MB+TROPI)
CK, MB: 2.6 ng/mL (ref 0.3–4.0)
CK, MB: 3 ng/mL (ref 0.3–4.0)
Relative Index: INVALID (ref 0.0–2.5)
Relative Index: INVALID (ref 0.0–2.5)
Relative Index: INVALID (ref 0.0–2.5)
Total CK: 59 U/L (ref 7–232)
Troponin I: <0.3 ng/mL (ref <0.30)

## 2011-11-01 LAB — BASIC METABOLIC PANEL
CO2: 27 mEq/L (ref 19–32)
Calcium: 9.2 mg/dL (ref 8.4–10.5)
GFR calc non Af Amer: 54 mL/min — ABNORMAL LOW (ref >90)
Glucose, Bld: 74 mg/dL (ref 70–99)
Potassium: 3.9 mEq/L (ref 3.5–5.1)
Sodium: 139 mEq/L (ref 135–145)

## 2011-11-01 LAB — GLUCOSE, CAPILLARY
Glucose-Capillary: 77 mg/dL (ref 70–99)
Glucose-Capillary: 97 mg/dL (ref 70–99)

## 2011-11-01 LAB — BODY FLUID CELL COUNT WITH DIFFERENTIAL
Lymphs, Fluid: 53 %
Monocyte-Macrophage-Serous Fluid: 24 % — ABNORMAL LOW (ref 50–90)
Neutrophil Count, Fluid: 23 % (ref 0–25)
Total Nucleated Cell Count, Fluid: 555 cu mm (ref 0–1000)

## 2011-11-01 LAB — PROTEIN, BODY FLUID: Total protein, fluid: 3.9 g/dL

## 2011-11-01 LAB — GLUCOSE, SEROUS FLUID: Glucose, Fluid: 81 mg/dL

## 2011-11-01 MED ORDER — LOPERAMIDE HCL 2 MG PO CAPS
2.0000 mg | ORAL_CAPSULE | ORAL | Status: DC | PRN
  Administered 2011-11-01: 2 mg via ORAL
  Filled 2011-11-01: qty 1

## 2011-11-01 NOTE — Progress Notes (Signed)
Echocardiogram 2D Echocardiogram has been performed.  [name] 11/01/2011, 10:58 AM

## 2011-11-01 NOTE — Progress Notes (Signed)
Nutrition Brief Note:  RD asked to assess patients need for CHF education. RD spoke with patient and wife regarding current knowledge of low sodium diet. Patient follows a low sodium diet at home. Patient was able to verbalize recommendations reports he follows the recommendations at home very well. Wife confirms. RD provided patient with additional low sodium diet handout. No questions at this time. RD feels patient does not need additional diet education at this time. Chart reviewed, no additional nutrition interventions needed.   [name]

## 2011-11-01 NOTE — Progress Notes (Signed)
UR Completed.  [name] 11/01/2011 [phone]  

## 2011-11-01 NOTE — Progress Notes (Signed)
Clinical Social Worker met with patient and spouse at bedside, regarding advance directive request. CSW provided patient with advance directive packet and patient and family were appreciative. CSW informed patient and spouse that the hospital can notarize form during hospital admission. CSW also informed them of places outside the hospital who can notarize if they choose to do so once discharged from hospital. CSW answered any additional questions of patient and spouse.   Rozetta Nunnery MSW, [employer] [phone]

## 2011-11-01 NOTE — Progress Notes (Signed)
Subjective:  No new complaints. Little diuresis with lasix use. Had 4 Litre of blood tinged ascitic fluid removed. No significant drop in blood pressure post procedure.  Objective:  Vital Signs in the last 24 hours: Temp:  [97.5 F (36.4 C)-97.9 F (36.6 C)] 97.5 F (36.4 C) (01/11 1400) Pulse Rate:  [67-80] 79  (01/11 1748) Cardiac Rhythm:  [-] Atrial fibrillation (01/11 0930) Resp:  [16-20] 20  (01/11 1400) BP: (94-130)/(64-80) 118/77 mmHg (01/11 1748) SpO2:  [96 %-99 %] 98 % (01/11 1400) Weight:  [74.753 kg (164 lb 12.8 oz)] 74.753 kg (164 lb 12.8 oz) (01/11 0606)  Physical Exam: BP Readings from Last 1 Encounters:  11/01/11 118/77    Wt Readings from Last 1 Encounters:  11/01/11 74.753 kg (164 lb 12.8 oz)    Weight change:   HEENT: [place]/AT, Eyes-Blue, PERL, EOMI, Conjunctiva-Pink, Sclera-Non-icteric Neck: No JVD, No bruit, Trachea midline. Lungs:  Clear, Bilateral. Cardiac:  Regular rhythm, normal S1 and S2, no S3.  Abdomen:  Soft, non-tender, decreased distension. Extremities:  No edema present. No cyanosis. No clubbing. CNS: AxOx3, Cranial nerves grossly intact, moves all 4 extremities. Right handed. Skin: Warm and dry.   Intake/Output from previous day: 01/10 0701 - 01/11 0700 In: 360 [P.O.:360] Out: 500 [Urine:500]    Lab Results: BMET    Component Value Date/Time   NA 139 11/01/2011 0811   K 3.9 11/01/2011 0811   CL 102 11/01/2011 0811   CO2 27 11/01/2011 0811   GLUCOSE 74 11/01/2011 0811   BUN 31* 11/01/2011 0811   CREATININE 1.30 11/01/2011 0811   CALCIUM 9.2 11/01/2011 0811   GFRNONAA 54* 11/01/2011 0811   GFRAA 62* 11/01/2011 0811   CBC    Component Value Date/Time   WBC 6.7 10/31/2011 1738   RBC 4.98 10/31/2011 1738   HGB 15.2 10/31/2011 1738   HCT 44.4 10/31/2011 1738   PLT 182 10/31/2011 1738   MCV 89.2 10/31/2011 1738   MCH 30.5 10/31/2011 1738   MCHC 34.2 10/31/2011 1738   RDW 15.5 10/31/2011 1738   LYMPHSABS 1.0 10/31/2011 1738   MONOABS 0.8 10/31/2011  1738   EOSABS 0.3 10/31/2011 1738   BASOSABS 0.1 10/31/2011 1738   CARDIAC ENZYMES Lab Results  Component Value Date   CKTOTAL 72 11/01/2011   CKMB 3.0 11/01/2011   TROPONINI <0.30 11/01/2011   Echocardiogram: Left ventricle: The cavity size was moderately dilated. Systolic function was moderately to severely reduced. The estimated ejection fraction was in the range of 30% to 35%. Diffuse hypokinesis. Doppler parameters are consistent with abnormal left ventricular relaxation (grade 1 diastolic dysfunction). - Mitral valve: Calcified annulus. Severe regurgitation. - Left atrium: The atrium was severely dilated. - Right ventricle: Systolic function was mildly reduced. - Right atrium: The atrium was moderately dilated. - Tricuspid valve: Severe regurgitation. - Pulmonary arteries: Systolic pressure was moderately increased. - Pericardium, extracardiac: Moderate ascites was noted.    Assessment/Plan:  Acute on chronic left heart systolic failure  Ascites  DM, II  Dil. Cardiomyopathy   Plan: IV Lasix, spironolactone, Home medications and fluid restriction.    LOS: 1 day    Orpah Cobb  MD  11/01/2011, 7:49 PM

## 2011-11-01 NOTE — Procedures (Signed)
Procedure : paracentesis - therapeutic and diagnostic Specimen : 4    L blood tinged serous fluid Complications : none immediate  Fluid sent to lab as requested.

## 2011-11-02 ENCOUNTER — Encounter (HOSPITAL_COMMUNITY): Payer: Self-pay | Admitting: *Deleted

## 2011-11-02 LAB — CBC
Hemoglobin: 14.6 g/dL (ref 13.0–17.0)
MCH: 30.5 pg (ref 26.0–34.0)
MCV: 89.1 fL (ref 78.0–100.0)
RBC: 4.79 MIL/uL (ref 4.22–5.81)
WBC: 8.5 10*3/uL (ref 4.0–10.5)

## 2011-11-02 LAB — GLUCOSE, CAPILLARY
Glucose-Capillary: 91 mg/dL (ref 70–99)
Glucose-Capillary: 126 mg/dL — ABNORMAL HIGH (ref 70–99)

## 2011-11-02 LAB — BASIC METABOLIC PANEL
CO2: 23 mEq/L (ref 19–32)
Glucose, Bld: 90 mg/dL (ref 70–99)
Potassium: 4.6 mEq/L (ref 3.5–5.1)
Sodium: 136 mEq/L (ref 135–145)

## 2011-11-02 NOTE — Progress Notes (Signed)
Subjective:  Feels better. Blood pressure lower as expected.  Objective:  Vital Signs in the last 24 hours: Temp:  [97.5 F (36.4 C)-98.9 F (37.2 C)] 98.7 F (37.1 C) (01/12 0525) Pulse Rate:  [72-80] 74  (01/12 0525) Cardiac Rhythm:  [-] Atrial paced (01/12 0846) Resp:  [16-20] 16  (01/12 0525) BP: (93-130)/(54-80) 93/54 mmHg (01/12 0525) SpO2:  [96 %-98 %] 98 % (01/12 0525) Weight:  [70.761 kg (156 lb)] 70.761 kg (156 lb) (01/12 0525)  Physical Exam: BP Readings from Last 1 Encounters:  11/02/11 93/54    Wt Readings from Last 1 Encounters:  11/02/11 70.761 kg (156 lb)    Weight change: -3.639 kg (-8 lb 0.4 oz)  HEENT: [place]/AT, Eyes-Blue, PERL, EOMI, Conjunctiva-Pink, Sclera-Non-icteric Neck: No JVD, No bruit, Trachea midline. Lungs:  Clear, Bilateral. Cardiac:  Regular rhythm, normal S1 and S2, no S3.  Abdomen:  Soft, non-tender, mild distension. Extremities:  No edema present. No cyanosis. No clubbing. CNS: AxOx3, Cranial nerves grossly intact, moves all 4 extremities. Right handed. Skin: Warm and dry.   Intake/Output from previous day: 01/11 0701 - 01/12 0700 In: 493 [P.O.:490; I.V.:3] Out: 1301 [Urine:1300; Stool:1]    Lab Results: BMET    Component Value Date/Time   NA 136 11/02/2011 0630   K 4.6 11/02/2011 0630   CL 101 11/02/2011 0630   CO2 23 11/02/2011 0630   GLUCOSE 90 11/02/2011 0630   BUN 32* 11/02/2011 0630   CREATININE 1.27 11/02/2011 0630   CALCIUM 9.2 11/02/2011 0630   GFRNONAA 55* 11/02/2011 0630   GFRAA 64* 11/02/2011 0630   CBC    Component Value Date/Time   WBC 8.5 11/02/2011 0630   RBC 4.79 11/02/2011 0630   HGB 14.6 11/02/2011 0630   HCT 42.7 11/02/2011 0630   PLT 182 11/02/2011 0630   MCV 89.1 11/02/2011 0630   MCH 30.5 11/02/2011 0630   MCHC 34.2 11/02/2011 0630   RDW 15.8* 11/02/2011 0630   LYMPHSABS 1.0 10/31/2011 1738   MONOABS 0.8 10/31/2011 1738   EOSABS 0.3 10/31/2011 1738   BASOSABS 0.1 10/31/2011 1738   CARDIAC ENZYMES Lab Results    Component Value Date   CKTOTAL 72 11/01/2011   CKMB 3.0 11/01/2011   TROPONINI <0.30 11/01/2011    Assessment/Plan:  Patient Active Hospital Problem List: Acute on chronic left heart systolic failure  Ascites  DM, II  Dil. Cardiomyopathy   Continue medical treatment   LOS: 2 days    Orpah Cobb  MD  11/02/2011, 10:39 AM

## 2011-11-03 LAB — GLUCOSE, CAPILLARY
Glucose-Capillary: 250 mg/dL — ABNORMAL HIGH (ref 70–99)
Glucose-Capillary: 126 mg/dL — ABNORMAL HIGH (ref 70–99)

## 2011-11-03 LAB — BASIC METABOLIC PANEL
CO2: 28 mEq/L (ref 19–32)
Calcium: 9.1 mg/dL (ref 8.4–10.5)
Chloride: 99 mEq/L (ref 96–112)
Glucose, Bld: 116 mg/dL — ABNORMAL HIGH (ref 70–99)
Sodium: 138 mEq/L (ref 135–145)

## 2011-11-03 MED ORDER — SODIUM CHLORIDE 0.45 % IV SOLN
INTRAVENOUS | Status: AC
  Administered 2011-11-03: 17:00:00 via INTRAVENOUS

## 2011-11-03 MED ORDER — FUROSEMIDE 10 MG/ML IJ SOLN
40.0000 mg | Freq: Once | INTRAMUSCULAR | Status: AC
  Administered 2011-11-03: 40 mg via INTRAVENOUS
  Filled 2011-11-03: qty 4

## 2011-11-03 NOTE — Progress Notes (Signed)
Subjective:  Regained some fluids + pre-renal azotemia.  Objective:  Vital Signs in the last 24 hours: Temp:  [97.5 F (36.4 C)-98.4 F (36.9 C)] 97.5 F (36.4 C) (01/13 0516) Pulse Rate:  [67-73] 67  (01/13 0516) Cardiac Rhythm:  [-] Atrial fibrillation (01/13 0909) Resp:  [18-20] 20  (01/13 0516) BP: (94-108)/(61-65) 108/64 mmHg (01/13 0516) SpO2:  [94 %-98 %] 98 % (01/13 0516) Weight:  [72.258 kg (159 lb 4.8 oz)] 72.258 kg (159 lb 4.8 oz) (01/13 0516)  Physical Exam: BP Readings from Last 1 Encounters:  11/03/11 108/64    Wt Readings from Last 1 Encounters:  11/03/11 72.258 kg (159 lb 4.8 oz)    Weight change: 1.497 kg (3 lb 4.8 oz)  HEENT: [place]/AT, Eyes-Blue, PERL, EOMI, Conjunctiva-Pink, Sclera-Non-icteric Neck: No JVD, No bruit, Trachea midline. Lungs:  Clear, Bilateral. Cardiac:  Regular rhythm, normal S1 and S2, no S3.  Abdomen:  Soft, non-tender, distended. Extremities:  No edema present. No cyanosis. No clubbing. CNS: AxOx3, Cranial nerves grossly intact, moves all 4 extremities. Right handed. Skin: Warm and dry.   Intake/Output from previous day: 01/12 0701 - 01/13 0700 In: 744 [P.O.:740; IV Piggyback:4] Out: 400 [Urine:400]    Lab Results: BMET    Component Value Date/Time   NA 138 11/03/2011 0700   K 4.1 11/03/2011 0700   CL 99 11/03/2011 0700   CO2 28 11/03/2011 0700   GLUCOSE 116* 11/03/2011 0700   BUN 50* 11/03/2011 0700   CREATININE 2.13* 11/03/2011 0700   CALCIUM 9.1 11/03/2011 0700   GFRNONAA 30* 11/03/2011 0700   GFRAA 34* 11/03/2011 0700   CBC    Component Value Date/Time   WBC 8.5 11/02/2011 0630   RBC 4.79 11/02/2011 0630   HGB 14.6 11/02/2011 0630   HCT 42.7 11/02/2011 0630   PLT 182 11/02/2011 0630   MCV 89.1 11/02/2011 0630   MCH 30.5 11/02/2011 0630   MCHC 34.2 11/02/2011 0630   RDW 15.8* 11/02/2011 0630   LYMPHSABS 1.0 10/31/2011 1738   MONOABS 0.8 10/31/2011 1738   EOSABS 0.3 10/31/2011 1738   BASOSABS 0.1 10/31/2011 1738   CARDIAC  ENZYMES Lab Results  Component Value Date   CKTOTAL 72 11/01/2011   CKMB 3.0 11/01/2011   TROPONINI <0.30 11/01/2011    Assessment/Plan: Patient Active Hospital Problem List: Acute on chronic left heart systolic failure  Ascites  DM, II  Dil. Cardiomyopathy   Plan: Hold Lisinopril. Resume lasix.   LOS: 3 days    Orpah Cobb  MD  11/03/2011, 9:54 AM

## 2011-11-03 NOTE — Plan of Care (Signed)
Problem: Phase I Progression Outcomes Goal: EF % per last Echo/documented,Core Reminder form on chart Outcome: Completed/Met Date Met:  11/03/11 30-35%

## 2011-11-04 LAB — GLUCOSE, CAPILLARY
Glucose-Capillary: 146 mg/dL — ABNORMAL HIGH (ref 70–99)
Glucose-Capillary: 132 mg/dL — ABNORMAL HIGH (ref 70–99)

## 2011-11-04 LAB — BASIC METABOLIC PANEL
BUN: 48 mg/dL — ABNORMAL HIGH (ref 6–23)
CO2: 21 mEq/L (ref 19–32)
Calcium: 8.9 mg/dL (ref 8.4–10.5)
GFR calc non Af Amer: 38 mL/min — ABNORMAL LOW (ref >90)
Glucose, Bld: 114 mg/dL — ABNORMAL HIGH (ref 70–99)
Sodium: 130 mEq/L — ABNORMAL LOW (ref 135–145)

## 2011-11-04 LAB — PATHOLOGIST SMEAR REVIEW

## 2011-11-04 MED ORDER — FUROSEMIDE 10 MG/ML IJ SOLN
40.0000 mg | Freq: Once | INTRAMUSCULAR | Status: AC
  Administered 2011-11-04: 40 mg via INTRAVENOUS
  Filled 2011-11-04: qty 4

## 2011-11-04 MED ORDER — METFORMIN HCL 500 MG PO TABS
500.0000 mg | ORAL_TABLET | Freq: Once | ORAL | Status: AC
  Administered 2011-11-04: 500 mg via ORAL
  Filled 2011-11-04: qty 1

## 2011-11-04 NOTE — Progress Notes (Signed)
Subjective:  Feeling better but worsening K+ level.  Objective:  Vital Signs in the last 24 hours: Temp:  [97.3 F (36.3 C)-98.2 F (36.8 C)] 97.7 F (36.5 C) (01/14 2137) Pulse Rate:  [65-67] 65  (01/14 2137) Cardiac Rhythm:  [-] Atrial fibrillation (01/14 1040) Resp:  [18] 18  (01/14 2137) BP: (101-109)/(67-70) 101/68 mmHg (01/14 2137) SpO2:  [95 %-100 %] 95 % (01/14 2137) Weight:  [73.029 kg (161 lb)] 73.029 kg (161 lb) (01/14 0424)  Physical Exam: BP Readings from Last 1 Encounters:  11/04/11 101/68    Wt Readings from Last 1 Encounters:  11/04/11 73.029 kg (161 lb)    Weight change: 0.771 kg (1 lb 11.2 oz)  HEENT: [place]/AT, Eyes-Blue, PERL, EOMI, Conjunctiva-Pink, Sclera-Non-icteric Neck: No JVD, No bruit, Trachea midline. Lungs:  Clear, Bilateral. Cardiac:  Regular rhythm, normal S1 and S2, no S3.  Abdomen:  Soft, non-tender, distended. Extremities:  No edema present. No cyanosis. No clubbing. CNS: AxOx3, Cranial nerves grossly intact, moves all 4 extremities. Right handed. Skin: Warm and dry.   Intake/Output from previous day: 01/13 0701 - 01/14 0700 In: 1218.7 [P.O.:650; I.V.:566.7; IV Piggyback:2] Out: 1275 [Urine:1275]    Lab Results: BMET    Component Value Date/Time   NA 130* 11/04/2011 0549   K 5.8* 11/04/2011 0549   CL 98 11/04/2011 0549   CO2 21 11/04/2011 0549   GLUCOSE 114* 11/04/2011 0549   BUN 48* 11/04/2011 0549   CREATININE 1.73* 11/04/2011 0549   CALCIUM 8.9 11/04/2011 0549   GFRNONAA 38* 11/04/2011 0549   GFRAA 44* 11/04/2011 0549   CBC    Component Value Date/Time   WBC 8.5 11/02/2011 0630   RBC 4.79 11/02/2011 0630   HGB 14.6 11/02/2011 0630   HCT 42.7 11/02/2011 0630   PLT 182 11/02/2011 0630   MCV 89.1 11/02/2011 0630   MCH 30.5 11/02/2011 0630   MCHC 34.2 11/02/2011 0630   RDW 15.8* 11/02/2011 0630   LYMPHSABS 1.0 10/31/2011 1738   MONOABS 0.8 10/31/2011 1738   EOSABS 0.3 10/31/2011 1738   BASOSABS 0.1 10/31/2011 1738   CARDIAC ENZYMES Lab  Results  Component Value Date   CKTOTAL 72 11/01/2011   CKMB 3.0 11/01/2011   TROPONINI <0.30 11/01/2011    Assessment/Plan: Patient Active Hospital Problem List: Acute on chronic left heart systolic failure  Ascites  DM, II  Dil. Cardiomyopathy  Plan: Stop Spironolactone and Lisinopril.           Continue lasix.     LOS: 4 days    Orpah Cobb  MD  11/04/2011, 11:23 PM

## 2011-11-05 LAB — GLUCOSE, CAPILLARY
Glucose-Capillary: 90 mg/dL (ref 70–99)
Glucose-Capillary: 146 mg/dL — ABNORMAL HIGH (ref 70–99)

## 2011-11-05 LAB — COMPREHENSIVE METABOLIC PANEL
CO2: 27 mEq/L (ref 19–32)
Calcium: 9.4 mg/dL (ref 8.4–10.5)
Creatinine, Ser: 1.53 mg/dL — ABNORMAL HIGH (ref 0.50–1.35)
GFR calc Af Amer: 51 mL/min — ABNORMAL LOW (ref >90)
GFR calc non Af Amer: 44 mL/min — ABNORMAL LOW (ref >90)
Glucose, Bld: 77 mg/dL (ref 70–99)
Sodium: 135 mEq/L (ref 135–145)
Total Protein: 7.1 g/dL (ref 6.0–8.3)

## 2011-11-05 MED ORDER — FUROSEMIDE 40 MG PO TABS: 40.0000 mg | ORAL_TABLET | Freq: Two times a day (BID) | ORAL | Status: DC

## 2011-11-05 NOTE — Progress Notes (Signed)
   CARE MANAGEMENT NOTE HEART FAILURE  11/05/2011   Patient:  [name], [name]   Account Number:  [id number]    Date Initiated:  11/01/2011  Documentation initiated by:  Shannan Harper  Subjective/Objective Assessment:   Patient admitted with one week episode of worsening SOB and abdominal swelling.  Found to be in an acute CHF failure, also ascites requiring thoracentesis.   Action/Plan:   Anticipate discharge to home with spouse.  Possible HH RN disease management at discharge.   Anticipated DC Date:    Anticipated DC Plan:    DC Planning Services:  CM consult    Choice offered to / List presented to:          Status of service:  Completed, signed off  Medicare Important Message Given:   (If response is "NO", the following Medicare IM given date fields will be blank) Date Medicare IM Given:   Date Additional Medicare IM Given:    Discharge Disposition:  HOME/SELF CARE  Per UR Regulation:  Reviewed for med. necessity/level of care/duration of stay  Comments:   11/05/11 1433--Pt. discharged prior to being seen by HF Case Manager.  No orders for [facility] services noted. Tera Mater, RN, BSN  UR Completed. 11/01/11 1414 Shannan Harper, RN, BSN   Initial CM contact:     By:   Initial CSW contact:     By:      Is this an INP Readmission < 30 days:  N (If "YES" please see readmission information at the bottom of note)  Patient living status prior to this admission:  FAMILY  Patient setting prior to this admission:  HOME  Comorbid conditions being treated that contributed to this admission:  HIGH-CHF, COPD, DM, CAD  CHF Readmission Risk:  high      Was referral made to [facility]:  N  Is the patient's PCP the same as attending:  Y PCP:    Readmission < 30 Days If pt has HH, did they contact the agency before going to the ED:   Name of [facility] agency:    Was the follow-up physician visit scheduled prior to discharge:    Did the patient follow-up with the physician prior  to this readmission:    Was there HF Clinic visits prior to readmission:    Were there ED visits between admissions:    Readmit type:    If unscheduled and related indicate reason for readmit:

## 2011-11-05 NOTE — Discharge Summary (Signed)
Physician Discharge Summary  Patient ID: [name] MRN: [id number] DOB/AGE: 72-Feb-1942 72 y.o.  Admit date: 10/31/2011 Discharge date: 11/05/2011  Admission Diagnoses: Principal Problem:  *CHF (congestive heart failure) Active Problems:  Ascites, other  DM II (diabetes mellitus, type II), controlled  Nonischemic dilated cardiomyopathy  Discharge Diagnoses:  Principal Problem:  *CHF (congestive heart failure) Active Problems:  Ascites, improving  DM II (diabetes mellitus, type II), controlled  Nonischemic dilated cardiomyopathy   Discharged Condition: good  Hospital Course: 71 years old with dilated cardiomyopathy had significant ascites and shortness of breath. No chest pain. He had 4 Litre of ascitic fluid removed on day 2 of hospitalization. His K+ level jumped to 5.8 from 4.1 requiring discontinuation of spironolactone and lisinopril. His abdomen remained stable with additional diuresis using IV lasix. He was sent home in satisfactory condition with 4-5 kg weight loss.  Consults: cardiology and Interventional radiology PA for Paracentesis.  Significant Diagnostic Studies: labs: Na+ 135, K+ 5.0 from 5.8, BUN 44, Cr-1.53 from 1.73  Treatments: cardiac meds: furosemide and procedures: paracentesis  Discharge Exam: Blood pressure 104/61, pulse 80, temperature 97.8 F (36.6 C), temperature source Oral, resp. rate 18, height 5\' 6"  (1.676 m), weight 72.7 kg (160 lb 4.4 oz), SpO2 95.00%. HEENT: [place]/AT, Eyes-Blue, PERL, EOMI, Conjunctiva-Pink, Sclera-Non-icteric  Neck: No JVD, No bruit, Trachea midline.  Lungs: Clear, Bilateral.  Cardiac: Regular rhythm, normal S1 and S2, no S3.  Abdomen: Soft, non-tender, distended.  Extremities: No edema present. No cyanosis. No clubbing.  CNS: AxOx3, Cranial nerves grossly intact, moves all 4 extremities. Right handed.  Skin: Warm and dry.   Disposition: Home or Self Care   Medication List  As of 11/05/2011 10:35 AM   STOP taking  these medications         lisinopril 2.5 MG tablet      metoprolol succinate 25 MG 24 hr tablet      potassium chloride 10 MEQ tablet      spironolactone 25 MG tablet         TAKE these medications         carvedilol 3.125 MG tablet   Commonly known as: COREG   Take 3.125 mg by mouth 2 (two) times daily with a meal.      furosemide 40 MG tablet   Commonly known as: LASIX   Take 1 tablet (40 mg total) by mouth 2 (two) times daily.      glipiZIDE 5 MG 24 hr tablet   Commonly known as: GLUCOTROL XL   Take 5 mg by mouth daily.      metFORMIN 500 MG tablet   Commonly known as: GLUCOPHAGE   Take 500 mg by mouth 2 (two) times daily with a meal.           Follow-up Information    Follow up with [facility], MD. Schedule an appointment as soon as possible for a visit in 2 weeks.   Contact information:   [address] [place] [address] [phone]          Signed: [name] 11/05/2011, 10:35 AM

## 2012-08-10 ENCOUNTER — Inpatient Hospital Stay (HOSPITAL_COMMUNITY)
Admission: AD | Admit: 2012-08-10 | Discharge: 2012-08-13 | DRG: 948 | Disposition: A | Discharge status: Home / Self Care | Payer: Medicare Other | Source: Ambulatory Visit | Attending: Cardiovascular Disease | Admitting: Cardiovascular Disease

## 2012-08-10 ENCOUNTER — Inpatient Hospital Stay (HOSPITAL_COMMUNITY): Payer: Medicare Other

## 2012-08-10 ENCOUNTER — Encounter (HOSPITAL_COMMUNITY): Payer: Self-pay | Admitting: General Practice

## 2012-08-10 DIAGNOSIS — I129 Hypertensive chronic kidney disease with stage 1 through stage 4 chronic kidney disease, or unspecified chronic kidney disease: Secondary | ICD-10-CM | POA: Diagnosis present

## 2012-08-10 DIAGNOSIS — I509 Heart failure, unspecified: Secondary | ICD-10-CM | POA: Diagnosis present

## 2012-08-10 DIAGNOSIS — E7800 Pure hypercholesterolemia, unspecified: Secondary | ICD-10-CM | POA: Diagnosis present

## 2012-08-10 DIAGNOSIS — Z79899 Other long term (current) drug therapy: Secondary | ICD-10-CM

## 2012-08-10 DIAGNOSIS — R188 Other ascites: Principal | ICD-10-CM | POA: Diagnosis present

## 2012-08-10 DIAGNOSIS — J449 Chronic obstructive pulmonary disease, unspecified: Secondary | ICD-10-CM | POA: Diagnosis present

## 2012-08-10 DIAGNOSIS — I428 Other cardiomyopathies: Secondary | ICD-10-CM | POA: Diagnosis present

## 2012-08-10 DIAGNOSIS — N189 Chronic kidney disease, unspecified: Secondary | ICD-10-CM | POA: Diagnosis present

## 2012-08-10 DIAGNOSIS — J4489 Other specified chronic obstructive pulmonary disease: Secondary | ICD-10-CM | POA: Diagnosis present

## 2012-08-10 DIAGNOSIS — Z87891 Personal history of nicotine dependence: Secondary | ICD-10-CM

## 2012-08-10 DIAGNOSIS — N179 Acute kidney failure, unspecified: Secondary | ICD-10-CM | POA: Diagnosis present

## 2012-08-10 DIAGNOSIS — Z86711 Personal history of pulmonary embolism: Secondary | ICD-10-CM

## 2012-08-10 DIAGNOSIS — E119 Type 2 diabetes mellitus without complications: Secondary | ICD-10-CM | POA: Diagnosis present

## 2012-08-10 DIAGNOSIS — I209 Angina pectoris, unspecified: Secondary | ICD-10-CM | POA: Diagnosis present

## 2012-08-10 DIAGNOSIS — I5022 Chronic systolic (congestive) heart failure: Secondary | ICD-10-CM | POA: Diagnosis present

## 2012-08-10 DIAGNOSIS — E876 Hypokalemia: Secondary | ICD-10-CM | POA: Diagnosis present

## 2012-08-10 DIAGNOSIS — M109 Gout, unspecified: Secondary | ICD-10-CM | POA: Diagnosis present

## 2012-08-10 DIAGNOSIS — I2510 Atherosclerotic heart disease of native coronary artery without angina pectoris: Secondary | ICD-10-CM | POA: Diagnosis present

## 2012-08-10 DIAGNOSIS — Z951 Presence of aortocoronary bypass graft: Secondary | ICD-10-CM

## 2012-08-10 LAB — COMPREHENSIVE METABOLIC PANEL
ALT: 13 U/L (ref 0–53)
AST: 23 U/L (ref 0–37)
Albumin: 3.6 g/dL (ref 3.5–5.2)
Alkaline Phosphatase: 105 U/L (ref 39–117)
CO2: 32 mEq/L (ref 19–32)
Chloride: 91 mEq/L — ABNORMAL LOW (ref 96–112)
Creatinine, Ser: 1.15 mg/dL (ref 0.50–1.35)
GFR calc non Af Amer: 62 mL/min — ABNORMAL LOW (ref >90)
Potassium: 2.7 mEq/L — CL (ref 3.5–5.1)
Total Bilirubin: 1.5 mg/dL — ABNORMAL HIGH (ref 0.3–1.2)

## 2012-08-10 LAB — CBC WITH DIFFERENTIAL/PLATELET
Basophils Relative: 1 % (ref 0–1)
Eosinophils Absolute: 0.2 10*3/uL (ref 0.0–0.7)
Eosinophils Relative: 3 % (ref 0–5)
Lymphs Abs: 1 10*3/uL (ref 0.7–4.0)
MCH: 30.7 pg (ref 26.0–34.0)
MCHC: 34.8 g/dL (ref 30.0–36.0)
MCV: 88.4 fL (ref 78.0–100.0)
Neutrophils Relative %: 73 % (ref 43–77)
Platelets: 155 10*3/uL (ref 150–400)
RBC: 4.49 MIL/uL (ref 4.22–5.81)
RDW: 15 % (ref 11.5–15.5)

## 2012-08-10 LAB — GLUCOSE, CAPILLARY

## 2012-08-10 LAB — PROTIME-INR
INR: 1.27 (ref 0.00–1.49)
Prothrombin Time: 15.6 seconds — ABNORMAL HIGH (ref 11.6–15.2)

## 2012-08-10 MED ORDER — POTASSIUM CHLORIDE CRYS ER 20 MEQ PO TBCR: 20.0000 meq | EXTENDED_RELEASE_TABLET | Freq: Every day | ORAL | Status: DC

## 2012-08-10 MED ORDER — POTASSIUM CHLORIDE CRYS ER 20 MEQ PO TBCR
40.0000 meq | EXTENDED_RELEASE_TABLET | Freq: Once | ORAL | Status: AC
  Administered 2012-08-10: 40 meq via ORAL
  Filled 2012-08-10: qty 2

## 2012-08-10 MED ORDER — SODIUM CHLORIDE 0.9 % IV SOLN: 250.0000 mL | INTRAVENOUS | Status: DC | PRN

## 2012-08-10 MED ORDER — GLIPIZIDE ER 5 MG PO TB24
5.0000 mg | ORAL_TABLET | Freq: Every day | ORAL | Status: DC
  Administered 2012-08-11 – 2012-08-13 (×3): 5 mg via ORAL
  Filled 2012-08-10 (×4): qty 1

## 2012-08-10 MED ORDER — CARVEDILOL 3.125 MG PO TABS
3.1250 mg | ORAL_TABLET | Freq: Two times a day (BID) | ORAL | Status: DC
  Administered 2012-08-11 – 2012-08-13 (×5): 3.125 mg via ORAL
  Filled 2012-08-10 (×7): qty 1

## 2012-08-10 MED ORDER — ACETAMINOPHEN 325 MG PO TABS: 650.0000 mg | ORAL_TABLET | Freq: Four times a day (QID) | ORAL | Status: DC | PRN

## 2012-08-10 MED ORDER — ASPIRIN EC 81 MG PO TBEC
81.0000 mg | DELAYED_RELEASE_TABLET | Freq: Every day | ORAL | Status: DC
  Filled 2012-08-10: qty 1

## 2012-08-10 MED ORDER — FUROSEMIDE 40 MG PO TABS
40.0000 mg | ORAL_TABLET | Freq: Two times a day (BID) | ORAL | Status: DC
  Administered 2012-08-10 – 2012-08-13 (×6): 40 mg via ORAL
  Filled 2012-08-10 (×7): qty 1

## 2012-08-10 MED ORDER — SODIUM CHLORIDE 0.9 % IJ SOLN: 3.0000 mL | INTRAMUSCULAR | Status: DC | PRN

## 2012-08-10 MED ORDER — METFORMIN HCL 500 MG PO TABS
500.0000 mg | ORAL_TABLET | Freq: Two times a day (BID) | ORAL | Status: DC
  Administered 2012-08-11 – 2012-08-12 (×3): 500 mg via ORAL
  Filled 2012-08-10 (×5): qty 1

## 2012-08-10 MED ORDER — HEPARIN SODIUM (PORCINE) 5000 UNIT/ML IJ SOLN
5000.0000 [IU] | Freq: Three times a day (TID) | INTRAMUSCULAR | Status: DC
  Administered 2012-08-10 – 2012-08-13 (×9): 5000 [IU] via SUBCUTANEOUS
  Filled 2012-08-10 (×11): qty 1

## 2012-08-10 MED ORDER — ACETAMINOPHEN 650 MG RE SUPP: 650.0000 mg | Freq: Four times a day (QID) | RECTAL | Status: DC | PRN

## 2012-08-10 MED ORDER — SODIUM CHLORIDE 0.9 % IJ SOLN
3.0000 mL | Freq: Two times a day (BID) | INTRAMUSCULAR | Status: DC
  Administered 2012-08-11: 3 mL via INTRAVENOUS

## 2012-08-10 MED ORDER — SODIUM CHLORIDE 0.9 % IJ SOLN
3.0000 mL | Freq: Two times a day (BID) | INTRAMUSCULAR | Status: DC
  Administered 2012-08-10 – 2012-08-13 (×3): 3 mL via INTRAVENOUS

## 2012-08-10 NOTE — H&P (Signed)
[name] is an 71 y.o. male.   Chief Complaint: Abdominal swelling.  HPI: 71 years old male with dilated cardiomyopathy has 2 week history of progressive abdominal swelling without significant shortness of breath or leg swelling. Also has DM, II and COPD.  Past Medical History  Diagnosis Date  . Shortness of breath   . Coronary artery disease   . Hypertension   . CHF (congestive heart failure)   . COPD (chronic obstructive pulmonary disease)   . High cholesterol   . Angina   . Pulmonary embolism 1980's  . Bronchitis   . Pneumonia   . 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 Gout   . Diabetes mellitus     type 2  . Chronic kidney disease     renal insufficiency      Past Surgical History  Procedure Date  . Retinal laser procedure     "both eyes; more times on the left"  . Cataract extraction w/ intraocular lens  implant, bilateral 2005  . Appendectomy ~ 1962  . Cardiac catheterization   . Cervical discectomy 1984    C6-7  . 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 No pertinent family history. Social History:  reports that he quit smoking about 38 years ago. His smoking use included Cigarettes. He has a 5 pack-year smoking history. He has quit using smokeless tobacco. His smokeless tobacco use included Chew. He reports that he drinks about one ounce of alcohol per week. He reports that he does not use illicit drugs.  Allergies:  Allergies  Allergen Reactions  . Sulfa Antibiotics     Medications Prior to Admission  Medication Sig Dispense Refill  . carvedilol (COREG) 3.125 MG tablet Take 3.125 mg by mouth 2 (two) times daily with a meal.      . furosemide (LASIX) 40 MG tablet Take 1 tablet (40 mg total) by mouth 2 (two) times daily.  60 tablet  1  . glipiZIDE (GLUCOTROL XL) 5 MG 24 hr tablet Take 5 mg by mouth daily.      . metFORMIN (GLUCOPHAGE) 500 MG tablet Take 500 mg by mouth 2 (two) times daily with a  meal.        Results for orders placed during the hospital encounter of 08/10/12 (from the past 48 hour(s))  GLUCOSE, CAPILLARY     Status: Abnormal   Collection Time   08/10/12  4:44 PM      Component Value Range Comment   Glucose-Capillary 208 (*) 70 - 99 mg/dL    Comment 1 Notify RN     COMPREHENSIVE METABOLIC PANEL     Status: Abnormal   Collection Time   08/10/12  5:27 PM      Component Value Range Comment   Sodium 135  135 - 145 mEq/L    Potassium 2.7 (*) 3.5 - 5.1 mEq/L    Chloride 91 (*) 96 - 112 mEq/L    CO2 32  19 - 32 mEq/L    Glucose, Bld 209 (*) 70 - 99 mg/dL    BUN 68 (*) 6 - 23 mg/dL    Creatinine, Ser 6.57  0.50 - 1.35 mg/dL    Calcium 84.6  8.4 - 10.5 mg/dL    Total Protein 8.2  6.0 - 8.3 g/dL    Albumin 3.6  3.5 - 5.2 g/dL    AST 23  0 - 37 U/L    ALT 13  0 - 53 U/L    Alkaline Phosphatase 105  39 - 117 U/L    Total Bilirubin 1.5 (*) 0.3 - 1.2 mg/dL    GFR calc non Af Amer 62 (*) >90 mL/min    GFR calc Af Amer 72 (*) >90 mL/min   CBC WITH DIFFERENTIAL     Status: Normal   Collection Time   08/10/12  5:27 PM      Component Value Range Comment   WBC 7.3  4.0 - 10.5 K/uL    RBC 4.49  4.22 - 5.81 MIL/uL    Hemoglobin 13.8  13.0 - 17.0 g/dL    HCT 14.7  82.9 - 56.2 %    MCV 88.4  78.0 - 100.0 fL    MCH 30.7  26.0 - 34.0 pg    MCHC 34.8  30.0 - 36.0 g/dL    RDW 13.0  86.5 - 78.4 %    Platelets 155  150 - 400 K/uL    Neutrophils Relative 73  43 - 77 %    Neutro Abs 5.3  1.7 - 7.7 K/uL    Lymphocytes Relative 14  12 - 46 %    Lymphs Abs 1.0  0.7 - 4.0 K/uL    Monocytes Relative 10  3 - 12 %    Monocytes Absolute 0.7  0.1 - 1.0 K/uL    Eosinophils Relative 3  0 - 5 %    Eosinophils Absolute 0.2  0.0 - 0.7 K/uL    Basophils Relative 1  0 - 1 %    Basophils Absolute 0.0  0.0 - 0.1 K/uL   PROTIME-INR     Status: Abnormal   Collection Time   08/10/12  5:27 PM      Component Value Range Comment   Prothrombin Time 15.6 (*) 11.6 - 15.2 seconds    INR 1.27   0.00 - 1.49   APTT     Status: Abnormal   Collection Time   08/10/12  5:27 PM      Component Value Range Comment   aPTT 40 (*) 24 - 37 seconds    No results found.  @ROS @ Constitutional: Positive for malaise/fatigue. Negative for fever, chills, weight loss and diaphoresis.  HENT: Negative for hearing loss, nosebleeds, sore throat and ear discharge.  Eyes: Negative for blurred vision, pain and discharge.  Respiratory: Positive for shortness of breath. Negative for cough and sputum production.  Cardiovascular: Positive for orthopnea. Negative for chest pain and leg swelling.  Gastrointestinal: Positive for abdominal pain. Negative for heartburn, nausea and constipation. + ascites. Genitourinary: Negative for dysuria and urgency.  Musculoskeletal: Positive for joint pain. Negative for myalgias and back pain.  Skin: Negative for rash.  Neurological: Positive for weakness. Negative for dizziness, tremors, focal weakness, seizures and headaches.  Psychiatric/Behavioral: Positive for depression. Negative for suicidal ideas, memory loss and substance abuse. The patient is not nervous/anxious and does not have insomnia.   Blood pressure 110/70, pulse 107, temperature 97.6 F (36.4 C), temperature source Oral, resp. rate 20, height 5\' 5"  (1.651 m), weight 63.095 kg (139 lb 1.6 oz), SpO2 97.00%. Constitutional: He appears averagely-developed and nourished.  HENT: Head: Normocephalic and atraumatic. Mouth/Throat: No oropharyngeal exudate.  Eyes: Conjunctivae are normal. Pupils are equal, round, and reactive to light. No scleral icterus.  Neck: Normal range of motion. JVD present. No tracheal deviation present. No thyromegaly present.  Cardiovascular: Normal rate and regular rhythm.  III/VI systolic murmur heard.  Respiratory: He is in no respiratory distress. He has no rales.  GI: Soft. He exhibits distension. There is no tenderness. There is no rebound.  Musculoskeletal: He exhibits no edema and  no tenderness.  Lymphadenopathy: He has no cervical adenopathy.  Neurological: He is alert and oriented to person, place, and time. He displays normal reflexes. No cranial nerve deficit.  Skin: Skin is warm and dry.  Psychiatric: He has a normal mood and affect.    Assessment/Plan Ascites Chronic left heart systolic failure. Hypokalemia DM, II Dilated cardiomyopathy.  Plan: Radiology consult for US guided therapeutic paracentesis. Home medications. K+ replacement  Keone Kamer S 08/10/2012, 6:47 PM

## 2012-08-11 LAB — GLUCOSE, CAPILLARY: Glucose-Capillary: 201 mg/dL — ABNORMAL HIGH (ref 70–99)

## 2012-08-11 LAB — BASIC METABOLIC PANEL
BUN: 76 mg/dL — ABNORMAL HIGH (ref 6–23)
CO2: 31 mEq/L (ref 19–32)
CO2: 26 mEq/L (ref 19–32)
Calcium: 9.7 mg/dL (ref 8.4–10.5)
Chloride: 91 mEq/L — ABNORMAL LOW (ref 96–112)
Creatinine, Ser: 1.19 mg/dL (ref 0.50–1.35)
Creatinine, Ser: 1.9 mg/dL — ABNORMAL HIGH (ref 0.50–1.35)
GFR calc Af Amer: 69 mL/min — ABNORMAL LOW (ref >90)
GFR calc non Af Amer: 59 mL/min — ABNORMAL LOW (ref >90)
GFR calc non Af Amer: 34 mL/min — ABNORMAL LOW (ref >90)
Glucose, Bld: 169 mg/dL — ABNORMAL HIGH (ref 70–99)
Glucose, Bld: 210 mg/dL — ABNORMAL HIGH (ref 70–99)

## 2012-08-11 MED ORDER — MAGNESIUM SULFATE 50 % IJ SOLN
1.0000 g | Freq: Once | INTRAVENOUS | Status: DC
  Filled 2012-08-11: qty 2

## 2012-08-11 MED ORDER — SODIUM CHLORIDE 0.9 % IV SOLN: 250.0000 mL | INTRAVENOUS | Status: DC | PRN

## 2012-08-11 MED ORDER — POTASSIUM CHLORIDE CRYS ER 20 MEQ PO TBCR
40.0000 meq | EXTENDED_RELEASE_TABLET | ORAL | Status: AC
  Administered 2012-08-11 (×4): 40 meq via ORAL
  Filled 2012-08-11 (×4): qty 2

## 2012-08-11 MED ORDER — MAGNESIUM SULFATE IN D5W 10-5 MG/ML-% IV SOLN
1.0000 g | Freq: Once | INTRAVENOUS | Status: AC
  Administered 2012-08-11: 1 g via INTRAVENOUS
  Filled 2012-08-11: qty 100

## 2012-08-11 MED ORDER — PANTOPRAZOLE SODIUM 40 MG IV SOLR
40.0000 mg | Freq: Every day | INTRAVENOUS | Status: DC
  Administered 2012-08-11 – 2012-08-12 (×2): 40 mg via INTRAVENOUS
  Filled 2012-08-11 (×3): qty 40

## 2012-08-11 MED ORDER — INSULIN ASPART 100 UNIT/ML ~~LOC~~ SOLN
0.0000 [IU] | Freq: Three times a day (TID) | SUBCUTANEOUS | Status: DC
  Administered 2012-08-11 – 2012-08-12 (×2): 3 [IU] via SUBCUTANEOUS
  Administered 2012-08-13: 5 [IU] via SUBCUTANEOUS

## 2012-08-11 MED ORDER — ONDANSETRON HCL 4 MG/2ML IJ SOLN
4.0000 mg | Freq: Four times a day (QID) | INTRAMUSCULAR | Status: DC | PRN
  Administered 2012-08-11: 4 mg via INTRAVENOUS
  Filled 2012-08-11: qty 2

## 2012-08-11 NOTE — Plan of Care (Signed)
Problem: Phase II Progression Outcomes Goal: Fluid volume status improved Outcome: Progressing Awaiting paracentesis when electrolytes stabalizes

## 2012-08-11 NOTE — Plan of Care (Signed)
Problem: Phase I Progression Outcomes Goal: EF % per last Echo/documented,Core Reminder form on chart Outcome: Completed/Met Date Met:  08/11/12 Per ECHO January 2013: EF 30-35%

## 2012-08-11 NOTE — Progress Notes (Signed)
Subjective:  Feeling better post IV magnesium and Protonix.   Objective:  Vital Signs in the last 24 hours: Temp:  [97.3 F (36.3 C)-98.1 F (36.7 C)] 97.3 F (36.3 C) (10/22 1400) Pulse Rate:  [80-101] 80  (10/22 1400) Cardiac Rhythm:  [-] Atrial fibrillation (10/22 0758) Resp:  [18-20] 20  (10/22 1400) BP: (103-120)/(70-78) 105/71 mmHg (10/22 1400) SpO2:  [96 %-97 %] 97 % (10/22 1400)  Physical Exam: BP Readings from Last 1 Encounters:  08/11/12 105/71    Wt Readings from Last 1 Encounters:  08/10/12 63.095 kg (139 lb 1.6 oz)    Weight change:   HEENT: [place]/AT, Eyes-Blue, PERL, EOMI, Conjunctiva-Pink, Sclera-Non-icteric Neck: 2 + JVD at 0 degree angle, No bruit, Trachea midline. Lungs:  Clear, Bilateral. Cardiac:  Regular rhythm, normal S1 and S2, no S3. III/VI systolic murmur. Abdomen:  Soft, non-tender. Distended and tympanic. Extremities:  No edema present. No cyanosis. No clubbing. CNS: AxOx3, Cranial nerves grossly intact, moves all 4 extremities. Right handed. Skin: Warm and dry.   Intake/Output from previous day: 10/21 0701 - 10/22 0700 In: 480 [P.O.:480] Out: 1500 [Urine:1500]    Lab Results: BMET    Component Value Date/Time   NA 130* 08/11/2012 1650   K 3.7 08/11/2012 1650   CL 90* 08/11/2012 1650   CO2 26 08/11/2012 1650   GLUCOSE 210* 08/11/2012 1650   BUN 76* 08/11/2012 1650   CREATININE 1.90* 08/11/2012 1650   CALCIUM 9.7 08/11/2012 1650   GFRNONAA 34* 08/11/2012 1650   GFRAA 39* 08/11/2012 1650   CBC    Component Value Date/Time   WBC 7.3 08/10/2012 1727   RBC 4.49 08/10/2012 1727   HGB 13.8 08/10/2012 1727   HCT 39.7 08/10/2012 1727   PLT 155 08/10/2012 1727   MCV 88.4 08/10/2012 1727   MCH 30.7 08/10/2012 1727   MCHC 34.8 08/10/2012 1727   RDW 15.0 08/10/2012 1727   LYMPHSABS 1.0 08/10/2012 1727   MONOABS 0.7 08/10/2012 1727   EOSABS 0.2 08/10/2012 1727   BASOSABS 0.0 08/10/2012 1727   CARDIAC ENZYMES Lab Results  Component  Value Date   CKTOTAL 72 11/01/2011   CKMB 3.0 11/01/2011   TROPONINI <0.30 11/01/2011    Assessment/Plan:  Patient Active Hospital Problem List: Ascites  Chronic left heart systolic failure.  Hypokalemia-resolved DM, II  Dilated cardiomyopathy.  Abdominal pain Acute renal failure, probably pre-renal azotemia  Abdominal paracentesis in AM Small dose IV fluid overnight. IV Protonix.    LOS: 1 day    Orpah Cobb  MD  08/11/2012, 6:55 PM

## 2012-08-11 NOTE — Progress Notes (Signed)
Patient only voided 200 cc all day.  Bladder scan done with 368 ml.  Dr. Algie Coffer up to floor to see patient.  Orders received.  Will continue to monitor.  Colman Cater

## 2012-08-12 ENCOUNTER — Inpatient Hospital Stay (HOSPITAL_COMMUNITY): Payer: Medicare Other

## 2012-08-12 LAB — GLUCOSE, CAPILLARY
Glucose-Capillary: 110 mg/dL — ABNORMAL HIGH (ref 70–99)
Glucose-Capillary: 195 mg/dL — ABNORMAL HIGH (ref 70–99)

## 2012-08-12 LAB — BASIC METABOLIC PANEL
BUN: 75 mg/dL — ABNORMAL HIGH (ref 6–23)
Calcium: 9.6 mg/dL (ref 8.4–10.5)
Creatinine, Ser: 1.84 mg/dL — ABNORMAL HIGH (ref 0.50–1.35)
GFR calc Af Amer: 41 mL/min — ABNORMAL LOW (ref >90)
GFR calc non Af Amer: 35 mL/min — ABNORMAL LOW (ref >90)
Glucose, Bld: 105 mg/dL — ABNORMAL HIGH (ref 70–99)
Potassium: 3.8 mEq/L (ref 3.5–5.1)

## 2012-08-12 MED ORDER — POTASSIUM CHLORIDE CRYS ER 20 MEQ PO TBCR
40.0000 meq | EXTENDED_RELEASE_TABLET | Freq: Two times a day (BID) | ORAL | Status: AC
  Administered 2012-08-12 – 2012-08-13 (×2): 40 meq via ORAL
  Filled 2012-08-12 (×2): qty 2

## 2012-08-12 MED ORDER — METOLAZONE 2.5 MG PO TABS
2.5000 mg | ORAL_TABLET | Freq: Once | ORAL | Status: AC
  Administered 2012-08-12: 2.5 mg via ORAL
  Filled 2012-08-12: qty 1

## 2012-08-12 MED ORDER — FUROSEMIDE 10 MG/ML IJ SOLN
40.0000 mg | Freq: Once | INTRAMUSCULAR | Status: AC
  Administered 2012-08-12: 40 mg via INTRAVENOUS
  Filled 2012-08-12: qty 4

## 2012-08-12 NOTE — Progress Notes (Signed)
Clinical Social Worker received referral for "Advance Directives".  CSW reviewed chart and met briefly pt's wife, [name], who stated they already have Advance Directives and are not interested in information.  CSW not to sign on, please re consult if needed.   Angelia Mould, MSW, [place] [phone]

## 2012-08-12 NOTE — Procedures (Signed)
US guided therapeutic paracentesis performed yielding 2 liters (maximum ordered) of slightly turbid, yellow fluid. No immediate complications.

## 2012-08-12 NOTE — Progress Notes (Signed)
Subjective:  Feeling better post 2 Litre paracentesis. Afebrile.  Objective:  Vital Signs in the last 24 hours: Temp:  [97.3 F (36.3 C)-98 F (36.7 C)] 98 F (36.7 C) (10/23 1400) Pulse Rate:  [76-91] 77  (10/23 1400) Cardiac Rhythm:  [-] Atrial fibrillation (10/22 2030) Resp:  [18] 18  (10/23 1400) BP: (96-113)/(61-72) 113/72 mmHg (10/23 1400) SpO2:  [96 %] 96 % (10/23 1400)  Physical Exam: BP Readings from Last 1 Encounters:  08/12/12 113/72    Wt Readings from Last 1 Encounters:  08/10/12 63.095 kg (139 lb 1.6 oz)    Weight change:   HEENT: [place]/AT, Eyes-Blue, PERL, EOMI, Conjunctiva-Pink, Sclera-Non-icteric Neck: 1 + JVD, No bruit, Trachea midline. Lungs:  Clear, Bilateral. Cardiac:  Regular rhythm, normal S1 and S2, no S3.  Abdomen:  Soft, non-tender. Extremities:  No edema present. No cyanosis. No clubbing. CNS: AxOx3, Cranial nerves grossly intact, moves all 4 extremities. Right handed. Skin: Warm and dry.   Intake/Output from previous day: 10/22 0701 - 10/23 0700 In: 705 [P.O.:580; IV Piggyback:100] Out: 750 [Urine:750]    Lab Results: BMET    Component Value Date/Time   NA 134* 08/12/2012 0535   K 3.8 08/12/2012 0535   CL 92* 08/12/2012 0535   CO2 27 08/12/2012 0535   GLUCOSE 105* 08/12/2012 0535   BUN 75* 08/12/2012 0535   CREATININE 1.84* 08/12/2012 0535   CALCIUM 9.6 08/12/2012 0535   GFRNONAA 35* 08/12/2012 0535   GFRAA 41* 08/12/2012 0535   CBC    Component Value Date/Time   WBC 7.3 08/10/2012 1727   RBC 4.49 08/10/2012 1727   HGB 13.8 08/10/2012 1727   HCT 39.7 08/10/2012 1727   PLT 155 08/10/2012 1727   MCV 88.4 08/10/2012 1727   MCH 30.7 08/10/2012 1727   MCHC 34.8 08/10/2012 1727   RDW 15.0 08/10/2012 1727   LYMPHSABS 1.0 08/10/2012 1727   MONOABS 0.7 08/10/2012 1727   EOSABS 0.2 08/10/2012 1727   BASOSABS 0.0 08/10/2012 1727   CARDIAC ENZYMES Lab Results  Component Value Date   CKTOTAL 72 11/01/2011   CKMB 3.0 11/01/2011   TROPONINI <0.30 11/01/2011    Assessment/Plan:  Patient Active Hospital Problem List: Ascites  Chronic left heart systolic failure.  Hypokalemia-resolved  DM, II  Dilated cardiomyopathy.  Abdominal pain  Acute renal failure, probably pre-renal azotemia  Try Zaroxolyn.   LOS: 2 days    Orpah Cobb  MD  08/12/2012, 4:52 PM

## 2012-08-12 NOTE — Progress Notes (Signed)
Utilization Review Completed.  

## 2012-08-13 LAB — BASIC METABOLIC PANEL
BUN: 79 mg/dL — ABNORMAL HIGH (ref 6–23)
Chloride: 92 mEq/L — ABNORMAL LOW (ref 96–112)
Creatinine, Ser: 1.81 mg/dL — ABNORMAL HIGH (ref 0.50–1.35)
GFR calc Af Amer: 41 mL/min — ABNORMAL LOW (ref >90)
GFR calc non Af Amer: 36 mL/min — ABNORMAL LOW (ref >90)
Potassium: 3.3 mEq/L — ABNORMAL LOW (ref 3.5–5.1)

## 2012-08-13 MED ORDER — CARVEDILOL 3.125 MG PO TABS: 3.1250 mg | ORAL_TABLET | Freq: Two times a day (BID) | ORAL | Status: AC

## 2012-08-13 NOTE — Plan of Care (Signed)
Problem: Discharge Progression Outcomes Goal: If EF < 40% ACEI/ARB addressed at discharge Outcome: Not Met (add Reason) Last documented EF 30-35% in 10/2011.  Patient not on ace inhibitor.  Did have increased creatnine this admission.

## 2012-08-13 NOTE — Discharge Summary (Signed)
Physician Discharge Summary  Patient ID: [name] MRN: [id number] DOB/AGE: 72-Jan-1942 72 y.o.  Admit date: 08/10/2012 Discharge date: 08/13/2012  Admission Diagnoses: Ascites  Chronic left heart systolic failure.  Hypokalemia DM, II  Dilated cardiomyopathy.      Discharge Diagnoses:  Active Problems:  * Ascites* Principle diagnosis Chronic left heart systolic failure.  Hypokalemia-resolved  DM, II  Dilated cardiomyopathy.  Abdominal pain  Acute renal failure, probably pre-renal azotemia   Discharged Condition: fair  Hospital Course: 71 years old male with dilated cardiomyopathy has chronic left heart systolic failure. He had increasing ascites without leg edema. He had severe hypokalemia from increasing lasix use at home. He had 2 Litre paracentesis and IV Lasix with good response and was discharged home in stable condition. He also received K= supplementation for hypokalemia. No ace-inhibitor due to renal dysfunction.  Consults: None  Significant Diagnostic Studies: labs: Normal CBC, borderline low Na+ and K+. Creatinine 1.8.   Treatments: cardiac meds: furosemide.  Discharge Exam: Blood pressure 106/72, pulse 79, temperature 97.9 F (36.6 C), temperature source Oral, resp. rate 18, height 5\' 5"  (1.651 m), weight 65.454 kg (144 lb 4.8 oz), SpO2 98.00%.  HEENT: [place]/AT, Eyes-Blue, PERL, EOMI, Conjunctiva-Pink, Sclera-Non-icteric  Neck: 1 + JVD, No bruit, Trachea midline.  Lungs: Clear, Bilateral.  Cardiac: Regular rhythm, normal S1 and S2, no S3.  Abdomen: Soft, non-tender.  Extremities: No edema present. No cyanosis. No clubbing.  CNS: AxOx3, Cranial nerves grossly intact, moves all 4 extremities. Right handed.  Skin: Warm and dry.  Disposition: 01-Home or Self Care     Medication List     As of 08/13/2012  2:17 PM    TAKE these medications         BLUE-EMU MAXIMUM STRENGTH EX   Apply 1 application topically 2 (two) times daily as needed. For  shoulder pain      carvedilol 3.125 MG tablet   Commonly known as: COREG   Take 1 tablet (3.125 mg total) by mouth 2 (two) times daily with a meal.      furosemide 40 MG tablet   Commonly known as: LASIX   Take 40 mg by mouth 3 (three) times daily as needed. For fluid      glipiZIDE 5 MG 24 hr tablet   Commonly known as: GLUCOTROL XL   Take 5 mg by mouth daily.      metFORMIN 500 MG tablet   Commonly known as: GLUCOPHAGE   Take 500 mg by mouth 2 (two) times daily with a meal.      metolazone 2.5 MG tablet   Commonly known as: ZAROXOLYN   Take 2.5 mg by mouth 2 (two) times daily. Take 1 hour before Lasix         Signed: Sada Mazzoni S 08/13/2012, 2:17 PM

## 2012-08-13 NOTE — Progress Notes (Signed)
Reviewed discharge instructions with patient and wife, they stated their understanding.  Patient discharged via wheelchair home with wife.  [name]  

## 2012-11-25 ENCOUNTER — Encounter (HOSPITAL_COMMUNITY): Payer: Self-pay | Admitting: General Practice

## 2012-11-25 ENCOUNTER — Observation Stay (HOSPITAL_COMMUNITY)
Admission: AD | Admit: 2012-11-25 | Discharge: 2012-11-27 | Disposition: A | Discharge status: Home / Self Care | Payer: Medicare HMO | Source: Ambulatory Visit | Attending: Cardiovascular Disease | Admitting: Cardiovascular Disease

## 2012-11-25 DIAGNOSIS — E119 Type 2 diabetes mellitus without complications: Secondary | ICD-10-CM | POA: Insufficient documentation

## 2012-11-25 DIAGNOSIS — E876 Hypokalemia: Secondary | ICD-10-CM | POA: Insufficient documentation

## 2012-11-25 DIAGNOSIS — I509 Heart failure, unspecified: Secondary | ICD-10-CM | POA: Insufficient documentation

## 2012-11-25 DIAGNOSIS — I5022 Chronic systolic (congestive) heart failure: Secondary | ICD-10-CM | POA: Insufficient documentation

## 2012-11-25 DIAGNOSIS — I428 Other cardiomyopathies: Secondary | ICD-10-CM | POA: Insufficient documentation

## 2012-11-25 DIAGNOSIS — R188 Other ascites: Principal | ICD-10-CM | POA: Insufficient documentation

## 2012-11-25 LAB — COMPREHENSIVE METABOLIC PANEL
ALT: 11 U/L (ref 0–53)
Alkaline Phosphatase: 75 U/L (ref 39–117)
CO2: 31 mEq/L (ref 19–32)
Calcium: 9.8 mg/dL (ref 8.4–10.5)
GFR calc Af Amer: 70 mL/min — ABNORMAL LOW (ref >90)
GFR calc non Af Amer: 60 mL/min — ABNORMAL LOW (ref >90)
Glucose, Bld: 161 mg/dL — ABNORMAL HIGH (ref 70–99)
Potassium: 2.1 mEq/L — CL (ref 3.5–5.1)
Sodium: 126 mEq/L — ABNORMAL LOW (ref 135–145)
Total Bilirubin: 2.3 mg/dL — ABNORMAL HIGH (ref 0.3–1.2)

## 2012-11-25 LAB — CBC WITH DIFFERENTIAL/PLATELET
Basophils Absolute: 0 10*3/uL (ref 0.0–0.1)
Basophils Relative: 0 % (ref 0–1)
MCHC: 35.8 g/dL (ref 30.0–36.0)
Neutro Abs: 5.6 10*3/uL (ref 1.7–7.7)
Neutrophils Relative %: 79 % — ABNORMAL HIGH (ref 43–77)
Platelets: 156 10*3/uL (ref 150–400)
RDW: 15.4 % (ref 11.5–15.5)

## 2012-11-25 MED ORDER — GLIPIZIDE ER 5 MG PO TB24
5.0000 mg | ORAL_TABLET | Freq: Every day | ORAL | Status: DC
  Administered 2012-11-27: 5 mg via ORAL
  Filled 2012-11-25 (×3): qty 1

## 2012-11-25 MED ORDER — SODIUM CHLORIDE 0.9 % IV SOLN: 250.0000 mL | INTRAVENOUS | Status: DC | PRN

## 2012-11-25 MED ORDER — FUROSEMIDE 40 MG PO TABS
40.0000 mg | ORAL_TABLET | Freq: Two times a day (BID) | ORAL | Status: DC
  Administered 2012-11-25: 40 mg via ORAL
  Filled 2012-11-25 (×4): qty 1

## 2012-11-25 MED ORDER — SODIUM CHLORIDE 0.9 % IJ SOLN
3.0000 mL | Freq: Two times a day (BID) | INTRAMUSCULAR | Status: DC
  Administered 2012-11-25 – 2012-11-26 (×2): 3 mL via INTRAVENOUS

## 2012-11-25 MED ORDER — POTASSIUM CHLORIDE CRYS ER 20 MEQ PO TBCR: 40.0000 meq | EXTENDED_RELEASE_TABLET | Freq: Once | ORAL | Status: DC

## 2012-11-25 MED ORDER — POTASSIUM CHLORIDE CRYS ER 20 MEQ PO TBCR
40.0000 meq | EXTENDED_RELEASE_TABLET | Freq: Once | ORAL | Status: AC
  Administered 2012-11-25: 40 meq via ORAL
  Filled 2012-11-25: qty 2

## 2012-11-25 MED ORDER — METOLAZONE 2.5 MG PO TABS
2.5000 mg | ORAL_TABLET | Freq: Two times a day (BID) | ORAL | Status: DC
  Administered 2012-11-25: 2.5 mg via ORAL
  Filled 2012-11-25 (×4): qty 1

## 2012-11-25 MED ORDER — SODIUM CHLORIDE 0.9 % IJ SOLN: 3.0000 mL | INTRAMUSCULAR | Status: DC | PRN

## 2012-11-25 MED ORDER — CARVEDILOL 3.125 MG PO TABS
3.1250 mg | ORAL_TABLET | Freq: Two times a day (BID) | ORAL | Status: DC
  Administered 2012-11-25 – 2012-11-27 (×5): 3.125 mg via ORAL
  Filled 2012-11-25 (×6): qty 1

## 2012-11-25 MED ORDER — SODIUM CHLORIDE 0.9 % IJ SOLN
3.0000 mL | Freq: Two times a day (BID) | INTRAMUSCULAR | Status: DC
  Administered 2012-11-26 – 2012-11-27 (×2): 3 mL via INTRAVENOUS

## 2012-11-25 MED ORDER — METFORMIN HCL 500 MG PO TABS
500.0000 mg | ORAL_TABLET | Freq: Two times a day (BID) | ORAL | Status: DC
  Administered 2012-11-25 – 2012-11-27 (×4): 500 mg via ORAL
  Filled 2012-11-25 (×6): qty 1

## 2012-11-25 MED ORDER — INSULIN ASPART 100 UNIT/ML ~~LOC~~ SOLN
0.0000 [IU] | Freq: Three times a day (TID) | SUBCUTANEOUS | Status: DC
Start: 2012-11-25 — End: 2012-11-27
  Administered 2012-11-25: 2 [IU] via SUBCUTANEOUS
  Administered 2012-11-26: 1 [IU] via SUBCUTANEOUS
  Administered 2012-11-26: 2 [IU] via SUBCUTANEOUS
  Administered 2012-11-26: 1 [IU] via SUBCUTANEOUS
  Administered 2012-11-27 (×2): 2 [IU] via SUBCUTANEOUS

## 2012-11-25 MED ORDER — POTASSIUM CHLORIDE CRYS ER 20 MEQ PO TBCR
40.0000 meq | EXTENDED_RELEASE_TABLET | Freq: Three times a day (TID) | ORAL | Status: DC
  Administered 2012-11-25: 40 meq via ORAL
  Filled 2012-11-25 (×2): qty 2

## 2012-11-25 MED ORDER — HEPARIN SODIUM (PORCINE) 5000 UNIT/ML IJ SOLN
5000.0000 [IU] | Freq: Three times a day (TID) | INTRAMUSCULAR | Status: DC
  Administered 2012-11-25 – 2012-11-27 (×5): 5000 [IU] via SUBCUTANEOUS
  Filled 2012-11-25 (×9): qty 1

## 2012-11-25 NOTE — H&P (Signed)
[name] is an 72 y.o. male.   Chief Complaint: Abdominal swelling. HPI: 72 years old male with dilated cardiomyopathy has 1 month history of progressive abdominal swelling with some shortness of breath but no leg swelling. Also has DM, II and COPD. His K+ level was also low.   Past Medical History  Diagnosis Date  . Shortness of breath   . Coronary artery disease   . Hypertension   . CHF (congestive heart failure)   . COPD (chronic obstructive pulmonary disease)   . High cholesterol   . Angina   . Pulmonary embolism 1980's  . Bronchitis   . Pneumonia   . 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 Gout   . Diabetes mellitus     type 2  . Chronic kidney disease     renal insufficiency      Past Surgical History  Procedure Date  . Retinal laser procedure     "both eyes; more times on the left"  . Cataract extraction w/ intraocular lens  implant, bilateral 2005  . Appendectomy ~ 1962  . Cardiac catheterization   . Cervical discectomy 1984    C6-7  . 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 Social History:  reports that he quit smoking about 39 years ago. His smoking use included Cigarettes. He has a 5 pack-year smoking history. He has quit using smokeless tobacco. His smokeless tobacco use included Chew. He reports that he drinks about one ounce of alcohol per week. He reports that he does not use illicit drugs.  Allergies:  Allergies  Allergen Reactions  . Sulfa Antibiotics Hives    Medications Prior to Admission  Medication Sig Dispense Refill  . carvedilol (COREG) 3.125 MG tablet Take 1 tablet (3.125 mg total) by mouth 2 (two) times daily with a meal.      . furosemide (LASIX) 40 MG tablet Take 40 mg by mouth 3 (three) times daily as needed. For fluid      . glipiZIDE (GLUCOTROL XL) 5 MG 24 hr tablet Take 5 mg by mouth daily.      . Menthol, Topical Analgesic, (BLUE-EMU MAXIMUM STRENGTH EX) Apply  1 application topically 2 (two) times daily as needed. For shoulder pain      . metFORMIN (GLUCOPHAGE) 500 MG tablet Take 500 mg by mouth 2 (two) times daily with a meal.      . metolazone (ZAROXOLYN) 2.5 MG tablet Take 2.5 mg by mouth 2 (two) times daily. Take 1 hour before Lasix        No results found for this or any previous visit (from the past 48 hour(s)). No results found.  @ROS @ Constitutional: Positive for malaise/fatigue. Negative for fever, chills, weight loss and diaphoresis.  HENT: Negative for hearing loss, nosebleeds, sore throat and ear discharge.  Eyes: Negative for blurred vision, pain and discharge.  Respiratory: Positive for shortness of breath. Negative for cough and sputum production.  Cardiovascular: Positive for orthopnea. Negative for chest pain and leg swelling.  Gastrointestinal: Positive for abdominal pain. Negative for heartburn, nausea and constipation. + ascites.  Genitourinary: Negative for dysuria and urgency.  Musculoskeletal: Positive for joint pain. Negative for myalgias and back pain.  Skin: Negative for rash.  Neurological: Positive for weakness. Negative for dizziness, tremors, focal weakness, seizures and headaches.  Psychiatric/Behavioral: Positive for depression. Negative for suicidal ideas,  memory loss and substance abuse. The patient is not nervous/anxious and does not have insomnia.   There were no vitals taken for this visit.  Constitutional: He appears averagely-developed and nourished.  HENT: Head: Normocephalic and atraumatic. Mouth/Throat: No oropharyngeal exudate.  Eyes: Conjunctivae are normal. Pupils are equal, round, and reactive to light. No scleral icterus.  Neck: Normal range of motion. JVD present. No tracheal deviation present. No thyromegaly present.  Cardiovascular: Normal rate and regular rhythm. III/VI systolic murmur heard.  Respiratory: He is in no respiratory distress. He has no rales.  GI: Soft. He exhibits distension.  There is no tenderness. There is no rebound.  Musculoskeletal: He exhibits no edema and no tenderness.  Lymphadenopathy: He has no cervical adenopathy.  Neurological: He is alert and oriented to person, place, and time. He displays normal reflexes. No cranial nerve deficit.  Skin: Skin is warm and dry.  Psychiatric: He has a normal mood and affect.   Assessment/Plan Ascites  Chronic left heart systolic failure.  Hypokalemia  DM, II  Dilated cardiomyopathy.  K+ replacement US guided therapeutic paracentesis  Tiney Zipper S 11/25/2012, 2:32 PM

## 2012-11-25 NOTE — Progress Notes (Signed)
CRITICAL VALUE ALERT  Critical value received:  K+ 2.1  Date of notification:  11/25/12  Time of notification:  1646  Critical value read back:yes  Nurse who received alert:  Christena Deem, RN  MD notified (1st page):  Dr. Algie Coffer  Time of first page:  1700  MD notified (2nd page):  Time of second page:  Responding MD: Dr. Algie Coffer  Time MD responded:  1700

## 2012-11-26 ENCOUNTER — Observation Stay (HOSPITAL_COMMUNITY): Payer: Medicare HMO

## 2012-11-26 LAB — GLUCOSE, CAPILLARY
Glucose-Capillary: 135 mg/dL — ABNORMAL HIGH (ref 70–99)
Glucose-Capillary: 164 mg/dL — ABNORMAL HIGH (ref 70–99)
Glucose-Capillary: 195 mg/dL — ABNORMAL HIGH (ref 70–99)

## 2012-11-26 LAB — BASIC METABOLIC PANEL
BUN: 88 mg/dL — ABNORMAL HIGH (ref 6–23)
GFR calc non Af Amer: 49 mL/min — ABNORMAL LOW (ref >90)
Glucose, Bld: 136 mg/dL — ABNORMAL HIGH (ref 70–99)
Potassium: 2.8 mEq/L — ABNORMAL LOW (ref 3.5–5.1)

## 2012-11-26 LAB — CBC
Hemoglobin: 13.5 g/dL (ref 13.0–17.0)
MCH: 30.3 pg (ref 26.0–34.0)
MCHC: 35.2 g/dL (ref 30.0–36.0)
MCV: 86.3 fL (ref 78.0–100.0)

## 2012-11-26 LAB — PROTIME-INR: Prothrombin Time: 14.1 seconds (ref 11.6–15.2)

## 2012-11-26 LAB — HEMOGLOBIN A1C: Mean Plasma Glucose: 174 mg/dL — ABNORMAL HIGH (ref <117)

## 2012-11-26 MED ORDER — POTASSIUM CHLORIDE CRYS ER 20 MEQ PO TBCR
40.0000 meq | EXTENDED_RELEASE_TABLET | Freq: Once | ORAL | Status: AC
  Administered 2012-11-26: 40 meq via ORAL
  Filled 2012-11-26: qty 2

## 2012-11-26 NOTE — Progress Notes (Signed)
Subjective:  Feeling little better. + ascites  Objective:  Vital Signs in the last 24 hours: Temp:  [97.4 F (36.3 C)-97.8 F (36.6 C)] 97.8 F (36.6 C) (02/06 1330) Pulse Rate:  [75-86] 75  (02/06 1330) Cardiac Rhythm:  [-] Normal sinus rhythm (02/06 0907) Resp:  [16-18] 17  (02/06 1330) BP: (95-119)/(59-86) 111/73 mmHg (02/06 1330) SpO2:  [93 %-98 %] 98 % (02/06 1330) Weight:  [65 kg (143 lb 4.8 oz)] 65 kg (143 lb 4.8 oz) (02/05 1430)  Physical Exam: BP Readings from Last 1 Encounters:  11/26/12 111/73    Wt Readings from Last 1 Encounters:  11/25/12 65 kg (143 lb 4.8 oz)    Weight change:   HEENT: [place]/AT, Eyes-Blue, PERL, EOMI, Conjunctiva-Pink, Sclera-Non-icteric Neck: ++ JVD, No bruit, Trachea midline. Lungs:  Clear, Bilateral. Cardiac:  Regular rhythm, normal S1 and S2, no S3.  Abdomen:  Soft, non-tender, distended with lower abdomen dull to percussion. Extremities:  No edema present. No cyanosis. No clubbing. CNS: AxOx3, Cranial nerves grossly intact, moves all 4 extremities.  Skin: Warm and dry.   Intake/Output from previous day: 02/05 0701 - 02/06 0700 In: -  Out: 420 [Urine:420]    Lab Results: BMET    Component Value Date/Time   NA 127* 11/26/2012 0605   K 2.8* 11/26/2012 0605   CL 84* 11/26/2012 0605   CO2 27 11/26/2012 0605   GLUCOSE 136* 11/26/2012 0605   BUN 88* 11/26/2012 0605   CREATININE 1.40* 11/26/2012 0605   CALCIUM 9.3 11/26/2012 0605   GFRNONAA 49* 11/26/2012 0605   GFRAA 56* 11/26/2012 0605   CBC    Component Value Date/Time   WBC 6.0 11/26/2012 0605   RBC 4.45 11/26/2012 0605   HGB 13.5 11/26/2012 0605   HCT 38.4* 11/26/2012 0605   PLT 143* 11/26/2012 0605   MCV 86.3 11/26/2012 0605   MCH 30.3 11/26/2012 0605   MCHC 35.2 11/26/2012 0605   RDW 15.3 11/26/2012 0605   LYMPHSABS 0.6* 11/25/2012 1525   MONOABS 0.7 11/25/2012 1525   EOSABS 0.1 11/25/2012 1525   BASOSABS 0.0 11/25/2012 1525   CARDIAC ENZYMES Lab Results  Component Value Date   CKTOTAL 72 11/01/2011   CKMB  3.0 11/01/2011   TROPONINI <0.30 11/01/2011    Scheduled Meds:   . carvedilol  3.125 mg Oral BID WC  . glipiZIDE  5 mg Oral Q breakfast  . heparin  5,000 Units Subcutaneous Q8H  . insulin aspart  0-9 Units Subcutaneous TID WC  . metFORMIN  500 mg Oral BID WC  . sodium chloride  3 mL Intravenous Q12H  . sodium chloride  3 mL Intravenous Q12H   Continuous Infusions:  PRN Meds:.sodium chloride, sodium chloride  Assessment/Plan:  Patient Active Hospital Problem List:  Ascites  Chronic left heart systolic failure.  Hypokalemia  DM, II  Dilated cardiomyopathy.  Paracentesis today post K+ replacement   LOS: 1 day    Orpah Cobb  MD  11/26/2012, 2:24 PM

## 2012-11-27 LAB — BASIC METABOLIC PANEL
BUN: 71 mg/dL — ABNORMAL HIGH (ref 6–23)
CO2: 30 mEq/L (ref 19–32)
Calcium: 8.9 mg/dL (ref 8.4–10.5)
Chloride: 85 mEq/L — ABNORMAL LOW (ref 96–112)
Creatinine, Ser: 1.1 mg/dL (ref 0.50–1.35)
Creatinine, Ser: 1.09 mg/dL (ref 0.50–1.35)
GFR calc Af Amer: 76 mL/min — ABNORMAL LOW (ref >90)
GFR calc non Af Amer: 66 mL/min — ABNORMAL LOW (ref >90)
Potassium: 2.5 mEq/L — CL (ref 3.5–5.1)
Sodium: 127 mEq/L — ABNORMAL LOW (ref 135–145)

## 2012-11-27 LAB — GLUCOSE, CAPILLARY
Glucose-Capillary: 111 mg/dL — ABNORMAL HIGH (ref 70–99)
Glucose-Capillary: 189 mg/dL — ABNORMAL HIGH (ref 70–99)

## 2012-11-27 MED ORDER — POTASSIUM CHLORIDE CRYS ER 20 MEQ PO TBCR
40.0000 meq | EXTENDED_RELEASE_TABLET | Freq: Once | ORAL | Status: AC
  Administered 2012-11-27: 40 meq via ORAL
  Filled 2012-11-27: qty 2

## 2012-11-27 MED ORDER — MAGNESIUM SULFATE IN D5W 10-5 MG/ML-% IV SOLN
1.0000 g | Freq: Once | INTRAVENOUS | Status: AC
  Administered 2012-11-27: 1 g via INTRAVENOUS
  Filled 2012-11-27: qty 100

## 2012-11-27 NOTE — Discharge Summary (Signed)
Physician Discharge Summary  Patient ID: [name] MRN: [id number] DOB/AGE: November 24, 1940 72 y.o.  Admit date: 11/25/2012 Discharge date: 11/27/2012  Admission Diagnoses: Ascites  Chronic left heart systolic failure.  Hypokalemia  DM, II  Dilated cardiomyopathy  Discharge Diagnoses:  Principal  Problem: * Ascites * Chronic left heart systolic failure.  Hypokalemia  DM, II  Dilated cardiomyopathy Hyponatremia  Discharged Condition: fair  Hospital Course: 72 years old male had 1 month history of worsening ascites with increasing shortness of breath. He had 7 litre of ascites fluid drained. His low potassium improved after and IV magnesium and PO potassium intake. He was able to ambulate better without shortness of breath. He was aware of low sodium and agrees to frequent office/lab work and increase salt intake for few days and hold Lasix for 1-2 days.  Consults: None  Significant Diagnostic Studies: labs: Sodium 127 to 125 meq. and elevated BUN/Cr. Normal CK. And T.I,  Treatments: procedures: paracentesis  Discharge Exam: Blood pressure 111/71, pulse 82, temperature 97.6 F (36.4 C), temperature source Oral, resp. rate 16, height 5\' 5"  (1.651 m), weight 61.236 kg (135 lb), SpO2 100.00%.   HEENT: [place]/AT, Eyes-Blue, PERL, EOMI, Conjunctiva-Pink, Sclera-Non-icteric  Neck: + JVD, No bruit, Trachea midline.  Lungs: Clear, Bilateral.  Cardiac: Regular rhythm, normal S1 and S2, no S3.  Abdomen: Softer, non-tender, distended with decreased lower abdomen dullness to percussion.  Extremities: No edema present. No cyanosis. No clubbing.  CNS: AxOx3, Cranial nerves grossly intact, moves all 4 extremities.  Skin: Warm and dry.  Disposition: 01-Home or Self Care     Medication List     As of 11/27/2012  5:28 PM    STOP taking these medications         metolazone 2.5 MG tablet   Commonly known as: ZAROXOLYN      TAKE these medications         carvedilol 3.125 MG tablet   Commonly known as: COREG   Take 1 tablet (3.125 mg total) by mouth 2 (two) times daily with a meal.      furosemide 80 MG tablet   Commonly known as: LASIX   Take 80 mg by mouth 2 (two) times daily. Can take up to three times daily      glipiZIDE 5 MG 24 hr tablet   Commonly known as: GLUCOTROL XL   Take 5 mg by mouth daily.      HUMULIN 70/30 [place]   Inject 15-20 Units into the skin daily as needed. Per sliding scale.      metFORMIN 500 MG tablet   Commonly known as: GLUCOPHAGE   Take 500 mg by mouth 2 (two) times daily with a meal.      POTASSIUM PO   Take 1 tablet by mouth 2 (two) times daily with a meal.      PRESCRIPTION MEDICATION   Apply 1 application topically 3 (three) times daily as needed. To back of knee    Powder           Follow-up Information    Follow up with [facility] S, MD. Schedule an appointment as soon as possible for a visit in 5 days.   Contact information:   [address] [place] [address] [phone]          Signed: [name] 11/27/2012, 5:28 PM

## 2012-11-27 NOTE — Progress Notes (Signed)
CRITICAL VALUE ALERT  Critical value received: Potassium= 2.5   Date of notification:  11/27/2012  Time of notification:  0809  Critical value read back:  YES  Nurse who received alert:  Eduardo Osier, RN  MD notified (1st page):  DR. [name]  Time of first page:  0809  MD notified (2nd page):  Time of second page:  Responding MD:  DR. [name]  Time MD responded: [phone]

## 2012-12-09 ENCOUNTER — Inpatient Hospital Stay (HOSPITAL_COMMUNITY)
Admission: AD | Admit: 2012-12-09 | Discharge: 2012-12-14 | DRG: 292 | Disposition: A | Discharge status: Home / Self Care | Payer: Medicare HMO | Source: Ambulatory Visit | Attending: Cardiovascular Disease | Admitting: Cardiovascular Disease

## 2012-12-09 DIAGNOSIS — Z79899 Other long term (current) drug therapy: Secondary | ICD-10-CM

## 2012-12-09 DIAGNOSIS — N189 Chronic kidney disease, unspecified: Secondary | ICD-10-CM | POA: Diagnosis present

## 2012-12-09 DIAGNOSIS — M109 Gout, unspecified: Secondary | ICD-10-CM | POA: Diagnosis present

## 2012-12-09 DIAGNOSIS — Z794 Long term (current) use of insulin: Secondary | ICD-10-CM

## 2012-12-09 DIAGNOSIS — Z951 Presence of aortocoronary bypass graft: Secondary | ICD-10-CM

## 2012-12-09 DIAGNOSIS — R188 Other ascites: Secondary | ICD-10-CM | POA: Diagnosis present

## 2012-12-09 DIAGNOSIS — I129 Hypertensive chronic kidney disease with stage 1 through stage 4 chronic kidney disease, or unspecified chronic kidney disease: Secondary | ICD-10-CM | POA: Diagnosis present

## 2012-12-09 DIAGNOSIS — E871 Hypo-osmolality and hyponatremia: Secondary | ICD-10-CM | POA: Diagnosis present

## 2012-12-09 DIAGNOSIS — Z86711 Personal history of pulmonary embolism: Secondary | ICD-10-CM

## 2012-12-09 DIAGNOSIS — E876 Hypokalemia: Secondary | ICD-10-CM | POA: Diagnosis not present

## 2012-12-09 DIAGNOSIS — I509 Heart failure, unspecified: Secondary | ICD-10-CM | POA: Diagnosis present

## 2012-12-09 DIAGNOSIS — I2589 Other forms of chronic ischemic heart disease: Secondary | ICD-10-CM | POA: Diagnosis present

## 2012-12-09 DIAGNOSIS — E119 Type 2 diabetes mellitus without complications: Secondary | ICD-10-CM | POA: Diagnosis present

## 2012-12-09 DIAGNOSIS — I5023 Acute on chronic systolic (congestive) heart failure: Principal | ICD-10-CM | POA: Diagnosis present

## 2012-12-09 DIAGNOSIS — E7800 Pure hypercholesterolemia, unspecified: Secondary | ICD-10-CM | POA: Diagnosis present

## 2012-12-09 DIAGNOSIS — I2510 Atherosclerotic heart disease of native coronary artery without angina pectoris: Secondary | ICD-10-CM | POA: Diagnosis present

## 2012-12-09 DIAGNOSIS — Z87891 Personal history of nicotine dependence: Secondary | ICD-10-CM

## 2012-12-09 DIAGNOSIS — J4489 Other specified chronic obstructive pulmonary disease: Secondary | ICD-10-CM | POA: Diagnosis present

## 2012-12-09 LAB — COMPREHENSIVE METABOLIC PANEL
ALT: 11 U/L (ref 0–53)
Albumin: 2.9 g/dL — ABNORMAL LOW (ref 3.5–5.2)
Alkaline Phosphatase: 76 U/L (ref 39–117)
Chloride: 94 mEq/L — ABNORMAL LOW (ref 96–112)
GFR calc Af Amer: 58 mL/min — ABNORMAL LOW (ref >90)
Glucose, Bld: 155 mg/dL — ABNORMAL HIGH (ref 70–99)
Potassium: 4.7 mEq/L (ref 3.5–5.1)
Sodium: 131 mEq/L — ABNORMAL LOW (ref 135–145)
Total Bilirubin: 0.9 mg/dL (ref 0.3–1.2)
Total Protein: 6.7 g/dL (ref 6.0–8.3)

## 2012-12-09 LAB — CBC WITH DIFFERENTIAL/PLATELET
Eosinophils Absolute: 0.2 10*3/uL (ref 0.0–0.7)
Hemoglobin: 14 g/dL (ref 13.0–17.0)
Lymphocytes Relative: 8 % — ABNORMAL LOW (ref 12–46)
Lymphs Abs: 0.6 10*3/uL — ABNORMAL LOW (ref 0.7–4.0)
MCH: 31.7 pg (ref 26.0–34.0)
Monocytes Relative: 10 % (ref 3–12)
Neutro Abs: 5.8 10*3/uL (ref 1.7–7.7)
Neutrophils Relative %: 79 % — ABNORMAL HIGH (ref 43–77)
Platelets: 176 10*3/uL (ref 150–400)
RBC: 4.42 MIL/uL (ref 4.22–5.81)
WBC: 7.3 10*3/uL (ref 4.0–10.5)

## 2012-12-09 LAB — TROPONIN I: Troponin I: <0.3 ng/mL (ref <0.30)

## 2012-12-09 LAB — PRO B NATRIURETIC PEPTIDE: Pro B Natriuretic peptide (BNP): 9967 pg/mL — ABNORMAL HIGH (ref 0–125)

## 2012-12-09 MED ORDER — FUROSEMIDE 10 MG/ML IJ SOLN
80.0000 mg | Freq: Once | INTRAMUSCULAR | Status: AC
  Administered 2012-12-09: 80 mg via INTRAVENOUS
  Filled 2012-12-09: qty 8

## 2012-12-09 MED ORDER — FUROSEMIDE 80 MG PO TABS
80.0000 mg | ORAL_TABLET | Freq: Two times a day (BID) | ORAL | Status: DC
  Administered 2012-12-10 – 2012-12-11 (×3): 80 mg via ORAL
  Filled 2012-12-09 (×7): qty 1

## 2012-12-09 MED ORDER — SODIUM CHLORIDE 0.9 % IJ SOLN
3.0000 mL | Freq: Two times a day (BID) | INTRAMUSCULAR | Status: DC
  Administered 2012-12-09 – 2012-12-13 (×8): 3 mL via INTRAVENOUS

## 2012-12-09 MED ORDER — CARVEDILOL 3.125 MG PO TABS
3.1250 mg | ORAL_TABLET | Freq: Two times a day (BID) | ORAL | Status: DC
  Administered 2012-12-10 – 2012-12-14 (×7): 3.125 mg via ORAL
  Filled 2012-12-09 (×13): qty 1

## 2012-12-09 MED ORDER — ASPIRIN EC 81 MG PO TBEC
81.0000 mg | DELAYED_RELEASE_TABLET | Freq: Every day | ORAL | Status: DC
  Administered 2012-12-09 – 2012-12-14 (×6): 81 mg via ORAL
  Filled 2012-12-09 (×6): qty 1

## 2012-12-09 MED ORDER — SODIUM CHLORIDE 0.9 % IJ SOLN: 3.0000 mL | INTRAMUSCULAR | Status: DC | PRN

## 2012-12-09 MED ORDER — SODIUM CHLORIDE 0.9 % IV SOLN: 250.0000 mL | INTRAVENOUS | Status: DC | PRN

## 2012-12-09 MED ORDER — HEPARIN SODIUM (PORCINE) 5000 UNIT/ML IJ SOLN
5000.0000 [IU] | Freq: Three times a day (TID) | INTRAMUSCULAR | Status: DC
  Administered 2012-12-09 – 2012-12-14 (×10): 5000 [IU] via SUBCUTANEOUS
  Filled 2012-12-09 (×17): qty 1

## 2012-12-09 MED ORDER — ONDANSETRON HCL 4 MG/2ML IJ SOLN: 4.0000 mg | Freq: Four times a day (QID) | INTRAMUSCULAR | Status: DC | PRN

## 2012-12-09 MED ORDER — ACETAMINOPHEN 325 MG PO TABS: 650.0000 mg | ORAL_TABLET | ORAL | Status: DC | PRN

## 2012-12-09 MED ORDER — METFORMIN HCL 500 MG PO TABS
500.0000 mg | ORAL_TABLET | Freq: Two times a day (BID) | ORAL | Status: DC
  Administered 2012-12-10 – 2012-12-14 (×8): 500 mg via ORAL
  Filled 2012-12-09 (×11): qty 1

## 2012-12-09 NOTE — H&P (Signed)
[name] is an 72 y.o. male.   Chief Complaint: Abdominal swelling HPI: 72 years old male with paracentesis 2 weeks ago has regained lost weight + more in 2 weeks. Some shortness of breath without leg edema.   Past Medical History  Diagnosis Date  . Shortness of breath   . Coronary artery disease   . Hypertension   . CHF (congestive heart failure)   . COPD (chronic obstructive pulmonary disease)   . High cholesterol   . Angina   . Pulmonary embolism 1980's  . Bronchitis   . Pneumonia   . 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 Gout   . Chronic kidney disease     renal insufficiency  . Diabetes mellitus     type 2      Past Surgical History  Procedure Laterality Date  . Retinal laser procedure      "both eyes; more times on the left"  . Cataract extraction w/ intraocular lens  implant, bilateral  2005  . Appendectomy  ~ 1962  . Cardiac catheterization    . Cervical discectomy  1984    C6-7  . 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 Social History:  reports that he quit smoking about 39 years ago. His smoking use included Cigarettes. He has a 5 pack-year smoking history. He has quit using smokeless tobacco. His smokeless tobacco use included Chew. He reports that he drinks about 1.0 ounces of alcohol per week. He reports that he does not use illicit drugs.  Allergies:  Allergies  Allergen Reactions  . Sulfa Antibiotics Hives    Medications Prior to Admission  Medication Sig Dispense Refill  . carvedilol (COREG) 3.125 MG tablet Take 1 tablet (3.125 mg total) by mouth 2 (two) times daily with a meal.      . furosemide (LASIX) 80 MG tablet Take 80 mg by mouth 3 (three) times daily.      [place] Kitchen glipiZIDE (GLUCOTROL XL) 5 MG 24 hr tablet Take 5 mg by mouth daily.      . Insulin Isophane & Regular (HUMULIN 70/30 [place]) Inject 15-20 Units into the skin daily as needed. Per sliding scale.      . Insulin  Isophane & Regular (HUMULIN 70/30 [place]) Inject 10-20 Units into the skin daily as needed (if CBG is greater than 150 take 10 units; if greater than 200 take 20 units).      . metFORMIN (GLUCOPHAGE) 500 MG tablet Take 500 mg by mouth 2 (two) times daily with a meal.      . miconazole (MICOTIN) 2 % powder Apply 1 application topically as needed for itching (to back of knee).      . potassium chloride SA (K-DUR,KLOR-CON) 20 MEQ tablet Take 20 mEq by mouth 2 (two) times daily.        Results for orders placed during the hospital encounter of 12/09/12 (from the past 48 hour(s))  CBC WITH DIFFERENTIAL     Status: Abnormal   Collection Time    12/09/12  5:37 PM      Result Value Range   WBC 7.3  4.0 - 10.5 K/uL   RBC 4.42  4.22 - 5.81 MIL/uL   Hemoglobin 14.0  13.0 - 17.0 g/dL   HCT 16.1  09.6 - 04.5 %   MCV 88.9  78.0 - 100.0 fL  MCH 31.7  26.0 - 34.0 pg   MCHC 35.6  30.0 - 36.0 g/dL   RDW 45.4 (*) 09.8 - 11.9 %   Platelets 176  150 - 400 K/uL   Neutrophils Relative 79 (*) 43 - 77 %   Neutro Abs 5.8  1.7 - 7.7 K/uL   Lymphocytes Relative 8 (*) 12 - 46 %   Lymphs Abs 0.6 (*) 0.7 - 4.0 K/uL   Monocytes Relative 10  3 - 12 %   Monocytes Absolute 0.7  0.1 - 1.0 K/uL   Eosinophils Relative 3  0 - 5 %   Eosinophils Absolute 0.2  0.0 - 0.7 K/uL   Basophils Relative 1  0 - 1 %   Basophils Absolute 0.0  0.0 - 0.1 K/uL  APTT     Status: None   Collection Time    12/09/12  5:37 PM      Result Value Range   aPTT 37  24 - 37 seconds   Comment:            IF BASELINE aPTT IS ELEVATED,     SUGGEST PATIENT RISK ASSESSMENT     BE USED TO DETERMINE APPROPRIATE     ANTICOAGULANT THERAPY.  PROTIME-INR     Status: None   Collection Time    12/09/12  5:37 PM      Result Value Range   Prothrombin Time 13.8  11.6 - 15.2 seconds   INR 1.07  0.00 - 1.49   No results found.  @ROS @ Constitutional: Positive for malaise/fatigue. Negative for fever, chills, weight loss and diaphoresis.  HENT: Negative  for hearing loss, nosebleeds, sore throat and ear discharge.  Eyes: Negative for blurred vision, pain and discharge.  Respiratory: Positive for shortness of breath. Negative for cough and sputum production.  Cardiovascular: Positive for orthopnea. Negative for chest pain and leg swelling.  Gastrointestinal: Positive for abdominal pain. Negative for heartburn, nausea and constipation. + ascites.  Genitourinary: Negative for dysuria and urgency.  Musculoskeletal: Positive for joint pain. Negative for myalgias and back pain.  Skin: Negative for rash.  Neurological: Positive for weakness. Negative for dizziness, tremors, focal weakness, seizures and headaches.  Psychiatric/Behavioral: Positive for depression. Negative for suicidal ideas, memory loss and substance abuse. The patient is not nervous/anxious and does not have insomnia.   Blood pressure 113/68, pulse 93, temperature 98.2 F (36.8 C), temperature source Oral, resp. rate 17, height 5\' 5"  (1.651 m), weight 71.4 kg (157 lb 6.5 oz), SpO2 100.00%.  Constitutional: He appears averagely-developed and nourished.  HENT: Head: Normocephalic and atraumatic. Mouth/Throat: No oropharyngeal exudate.  Eyes: Conjunctivae are normal. Pupils are equal, round, and reactive to light. No scleral icterus.  Neck: Normal range of motion. JVD present. No tracheal deviation present. No thyromegaly present.  Cardiovascular: Normal rate and regular rhythm. III/VI systolic murmur heard.  Respiratory: He is in no respiratory distress. He has no rales.  GI: Soft. He exhibits marked distension. There is no tenderness. There is no rebound.  Musculoskeletal: He exhibits no edema and no tenderness.  Lymphadenopathy: He has no cervical adenopathy.  Neurological: He is alert and oriented to person, place, and time. He displays normal reflexes. No cranial nerve deficit.  Skin: Skin is warm and dry.  Psychiatric: He has a normal mood and affect.    Assessment/Plan Ascites  Chronic left heart systolic failure.  Hypokalemia  DM, II  Dilated cardiomyopathy  Blood work if stable repeat ascitic fluid removal.  Saleah Rishel S  12/09/2012, 6:23 PM

## 2012-12-09 NOTE — Progress Notes (Signed)
Central monitoring paged nurse to inform them that patient experienced 10 beats of V-Tach, followed by a Sinus Rhythm, Followed by 14 additional beats of V-Tach. The nurse checked the patient, and the patient was asymptomatic. The nurse paged Dr. Algie Coffer. Dr. Algie Coffer returned page and ordered a Mag level for the am. The nurse carried out Drs order. [name]

## 2012-12-10 ENCOUNTER — Encounter (HOSPITAL_COMMUNITY): Payer: Self-pay | Admitting: General Practice

## 2012-12-10 ENCOUNTER — Observation Stay (HOSPITAL_COMMUNITY): Payer: Medicare HMO

## 2012-12-10 ENCOUNTER — Inpatient Hospital Stay (HOSPITAL_COMMUNITY): Payer: Medicare HMO

## 2012-12-10 LAB — TROPONIN I: Troponin I: <0.3 ng/mL (ref <0.30)

## 2012-12-10 LAB — GLUCOSE, CAPILLARY: Glucose-Capillary: 152 mg/dL — ABNORMAL HIGH (ref 70–99)

## 2012-12-10 LAB — CBC
Platelets: 180 10*3/uL (ref 150–400)
RBC: 4.23 MIL/uL (ref 4.22–5.81)
RDW: 16.2 % — ABNORMAL HIGH (ref 11.5–15.5)
WBC: 6 10*3/uL (ref 4.0–10.5)

## 2012-12-10 LAB — BASIC METABOLIC PANEL
Calcium: 8.8 mg/dL (ref 8.4–10.5)
GFR calc Af Amer: 68 mL/min — ABNORMAL LOW (ref >90)
GFR calc non Af Amer: 59 mL/min — ABNORMAL LOW (ref >90)
Potassium: 4 mEq/L (ref 3.5–5.1)
Sodium: 131 mEq/L — ABNORMAL LOW (ref 135–145)

## 2012-12-10 NOTE — Progress Notes (Signed)
Pt's BP 86/52. Obtained another BP on pt and was 94/56. Pt denies dizziness or light headedness. Pt's site from paracentesis started leaking and saturated his gown and bed. Placed pressure dressing over site and cleaned up bed/ replaced gown. Called MD and unable to get in touch. Will continue trying and will continue to monitor pt.

## 2012-12-10 NOTE — Progress Notes (Signed)
UR Completed.  Amylynn Fano Jane [phone] 12/10/2012  

## 2012-12-10 NOTE — Progress Notes (Signed)
Spoke with MD about pt's BP and fluid from paracentesis site. MD states to keep pressure dressing on site and continue to monitor. No further orders.

## 2012-12-10 NOTE — Progress Notes (Signed)
Subjective:  Feeling better post paracentesis-6.8 L. Afebrile.  Objective:  Vital Signs in the last 24 hours: Temp:  [97.6 F (36.4 C)-98.3 F (36.8 C)] 97.6 F (36.4 C) (02/20 1424) Pulse Rate:  [79-82] 82 (02/20 1424) Cardiac Rhythm:  [-] Atrial fibrillation (02/20 0848) Resp:  [18] 18 (02/20 1424) BP: (86-111)/(52-81) 94/56 mmHg (02/20 1518) SpO2:  [98 %-100 %] 98 % (02/20 1424) Weight:  [72.621 kg (160 lb 1.6 oz)] 72.621 kg (160 lb 1.6 oz) (02/20 0430)  Physical Exam: BP Readings from Last 1 Encounters:  12/10/12 94/56     Wt Readings from Last 1 Encounters:  12/10/12 72.621 kg (160 lb 1.6 oz)    Weight change:   HEENT: [place]/AT, Eyes-Blue, PERL, EOMI, Conjunctiva-Pink, Sclera-Non-icteric Neck: No JVD, No bruit, Trachea midline. Lungs:  Crackles at bases, Bilateral. Cardiac:  Regular rhythm, normal S1 and S2, no S3. III/VI systolic murmur. Abdomen:  Soft, non-tender, 50 % + reduction in distension. Extremities:  No edema present. No cyanosis. No clubbing. CNS: AxOx3, Cranial nerves grossly intact, moves all 4 extremities. Right handed. Skin: Warm and dry.   Intake/Output from previous day: 02/19 0701 - 02/20 0700 In: 240 [P.O.:240] Out: 275 [Urine:275]    Lab Results: BMET    Component Value Date/Time   NA 131* 12/10/2012 0455   K 4.0 12/10/2012 0455   CL 96 12/10/2012 0455   CO2 24 12/10/2012 0455   GLUCOSE 100* 12/10/2012 0455   BUN 35* 12/10/2012 0455   CREATININE 1.20 12/10/2012 0455   CALCIUM 8.8 12/10/2012 0455   GFRNONAA 59* 12/10/2012 0455   GFRAA 68* 12/10/2012 0455   CBC    Component Value Date/Time   WBC 6.0 12/10/2012 0455   RBC 4.23 12/10/2012 0455   HGB 13.1 12/10/2012 0455   HCT 37.3* 12/10/2012 0455   PLT 180 12/10/2012 0455   MCV 88.2 12/10/2012 0455   MCH 31.0 12/10/2012 0455   MCHC 35.1 12/10/2012 0455   RDW 16.2* 12/10/2012 0455   LYMPHSABS 0.6* 12/09/2012 1737   MONOABS 0.7 12/09/2012 1737   EOSABS 0.2 12/09/2012 1737   BASOSABS 0.0 12/09/2012 1737    CARDIAC ENZYMES Lab Results  Component Value Date   CKTOTAL 72 11/01/2011   CKMB 3.0 11/01/2011   TROPONINI <0.30 12/10/2012    Scheduled Meds: . aspirin EC  81 mg Oral Daily  . carvedilol  3.125 mg Oral BID WC  . furosemide  80 mg Oral BID  . heparin  5,000 Units Subcutaneous Q8H  . metFORMIN  500 mg Oral BID WC  . sodium chloride  3 mL Intravenous Q12H   Continuous Infusions:  PRN Meds:.sodium chloride, acetaminophen, ondansetron (ZOFRAN) IV, sodium chloride  Assessment/Plan:  Ascites  Chronic left heart systolic failure.  Hypokalemia  DM, II  Dilated cardiomyopathy  Increase activity. Home in AM if stable.   LOS: 1 day    Orpah Cobb  MD  12/10/2012, 6:44 PM

## 2012-12-10 NOTE — Procedures (Signed)
US guided therapeutic paracentesis performed yielding 6.8 liters yellow fluid. No immediate complications.

## 2012-12-10 NOTE — Care Management Note (Addendum)
    Page 1 of 2   12/14/2012     3:55:00 PM   CARE MANAGEMENT NOTE 12/14/2012  Patient:  [name], [name]   Account Number:  [id number]  Date Initiated:  12/10/2012  Documentation initiated by:  Alila Sotero  Subjective/Objective Assessment:   PT ADM WITH CHF, ASCITES.  PTA, PT RESIDES AT HOME WITH SPOUSE.     Action/Plan:   WILL FOLLOW FOR HOME NEEDS AS PT PROGRESSES.  MAY BENEFIT FROM HHRN AT DC FOR DISEASE MGMT OF CHF.   Anticipated DC Date:  12/13/2012   Anticipated DC Plan:  HOME W HOME HEALTH SERVICES      DC Planning Services  CM consult      [facility] Choice  HOME HEALTH   Choice offered to / List presented to:  C-1 Patient        HH arranged  HH-1 RN  HH-10 DISEASE MANAGEMENT      HH agency  [facility]   Status of service:  Completed, signed off Medicare Important Message given?   (If response is "NO", the following Medicare IM given date fields will be blank) Date Medicare IM given:   Date Additional Medicare IM given:    Discharge Disposition:  HOME W HOME HEALTH SERVICES  Per UR Regulation:  Reviewed for med. necessity/level of care/duration of stay  If discussed at Long Length of Stay Meetings, dates discussed:    Comments:  12/14/12 Rosalita Chessman [phone] NOTIFIED [facility] OF DC HOME TODAY.  12/11/12 Alin Chavira,RN,BSN [phone] PT FOR LIKELY DC TOMORROW.  REFERRAL TO [facility] FOR HHRN FOR CHF FOLLOW UP, PER PT CHOICE.  START OF CARE 24-48H POST DC DATE.

## 2012-12-11 LAB — GLUCOSE, CAPILLARY
Glucose-Capillary: 92 mg/dL (ref 70–99)
Glucose-Capillary: 140 mg/dL — ABNORMAL HIGH (ref 70–99)
Glucose-Capillary: 171 mg/dL — ABNORMAL HIGH (ref 70–99)
Glucose-Capillary: 143 mg/dL — ABNORMAL HIGH (ref 70–99)

## 2012-12-11 LAB — BASIC METABOLIC PANEL
CO2: 25 mEq/L (ref 19–32)
Chloride: 95 mEq/L — ABNORMAL LOW (ref 96–112)
Sodium: 128 mEq/L — ABNORMAL LOW (ref 135–145)

## 2012-12-11 MED ORDER — FUROSEMIDE 10 MG/ML IJ SOLN
60.0000 mg | Freq: Once | INTRAMUSCULAR | Status: AC
  Administered 2012-12-11: 60 mg via INTRAVENOUS
  Filled 2012-12-11: qty 6

## 2012-12-11 MED ORDER — FUROSEMIDE 80 MG PO TABS
80.0000 mg | ORAL_TABLET | Freq: Two times a day (BID) | ORAL | Status: DC
  Administered 2012-12-11 – 2012-12-13 (×4): 80 mg via ORAL
  Filled 2012-12-11 (×6): qty 1

## 2012-12-11 MED ORDER — POTASSIUM CHLORIDE CRYS ER 20 MEQ PO TBCR
20.0000 meq | EXTENDED_RELEASE_TABLET | Freq: Once | ORAL | Status: AC
  Administered 2012-12-11: 20 meq via ORAL
  Filled 2012-12-11: qty 1

## 2012-12-11 MED ORDER — METOLAZONE 2.5 MG PO TABS
2.5000 mg | ORAL_TABLET | Freq: Every day | ORAL | Status: DC
  Administered 2012-12-11 – 2012-12-12 (×2): 2.5 mg via ORAL
  Filled 2012-12-11 (×3): qty 1

## 2012-12-11 MED ORDER — SPIRONOLACTONE 12.5 MG HALF TABLET
12.5000 mg | ORAL_TABLET | Freq: Every day | ORAL | Status: DC
  Administered 2012-12-11 – 2012-12-14 (×4): 12.5 mg via ORAL
  Filled 2012-12-11 (×4): qty 1

## 2012-12-11 MED ORDER — DIGOXIN 250 MCG PO TABS
0.2500 mg | ORAL_TABLET | Freq: Every day | ORAL | Status: AC
  Administered 2012-12-11 – 2012-12-12 (×2): 0.25 mg via ORAL
  Filled 2012-12-11 (×2): qty 1

## 2012-12-11 NOTE — Clinical Documentation Improvement (Signed)
GENERIC DOCUMENTATION CLARIFICATION QUERY  THIS DOCUMENT IS NOT A PERMANENT PART OF THE MEDICAL RECORD  TO RESPOND TO THE THIS QUERY, FOLLOW THE INSTRUCTIONS BELOW:  1. If needed, update documentation for the patient's encounter via the notes activity.  2. Access this query again and click edit on the In [employer].  3. After updating, or not, click F2 to complete all highlighted (required) fields concerning your review. Select "additional documentation in the medical record" OR "no additional documentation provided".  4. Click Sign note button.  5. The deficiency will fall out of your In Basket *Please let us know if you are not able to complete this workflow by phone or e-mail (listed below).  Please update your documentation within the medical record to reflect your response to this query.                                                                                        12/11/12   Dear Dr. Algie Coffer / Associates,  In a better effort to capture your patient's severity of illness, reflect appropriate length of stay and utilization of resources, a review of the patient medical record has revealed the following indicators.    Based on your clinical judgment, please clarify and document in a progress note and/or discharge summary the clinical condition associated with the following supporting information:  In responding to this query please exercise your independent judgment.  The fact that a query is asked, does not imply that any particular answer is desired or expected.   Possible Clinical Conditions?  ______Hyponatremia   ______Other Condition  ______Cannot Clinically Determine     Diagnostics: 02/21:  Sodium=128 02/20:  Sodium=131   You may use possible, probable, or suspect with inpatient documentation. possible, probable, suspected diagnoses MUST be documented at the time of discharge  Reviewed: additional documentation in the medical record  Thank  You,  Marciano Sequin,  Clinical Documentation Specialist:  Pager: [phone]  Phone: [phone]  Health Information Management [place]

## 2012-12-11 NOTE — Progress Notes (Signed)
Pharmacist Heart Failure Core Measure Documentation  Assessment: [name] has noted h/o systolic dysfunction.  Rationale: Heart failure patients with left ventricular systolic dysfunction (LVSD) and an EF < 40% should be prescribed an angiotensin converting enzyme inhibitor (ACEI) or angiotensin receptor blocker (ARB) at discharge unless a contraindication is documented in the medical record.  This patient is not currently on an ACEI or ARB for HF.  This note is being placed in the record in order to provide documentation that a contraindication to the use of these agents is present for this encounter.  ACE Inhibitor or Angiotensin Receptor Blocker is contraindicated (specify all that apply)  []   ACEI allergy AND ARB allergy []   Angioedema []   Moderate or severe aortic stenosis []   Hyperkalemia [x]   Hypotension []   Renal artery stenosis []   Worsening renal function, preexisting renal disease or dysfunction   [name] 12/11/2012 8:25 AM

## 2012-12-11 NOTE — Progress Notes (Signed)
[facility]  Patient Status: New  [facility] is providing the following services: RN - on the schedule for a nursing visit at home on Monday  If patient discharges after hours, please call [phone].   [name] 12/11/2012, 4:40 PM

## 2012-12-11 NOTE — Progress Notes (Signed)
Subjective:  No new complaints. Not peeing as much.  Objective:  Vital Signs in the last 24 hours: Temp:  [97.2 F (36.2 C)-97.6 F (36.4 C)] 97.6 F (36.4 C) (02/21 1358) Pulse Rate:  [74-93] 80 (02/21 1406) Cardiac Rhythm:  [-] Atrial fibrillation (02/21 0737) Resp:  [18] 18 (02/21 1358) BP: (96-102)/(56-62) 102/56 mmHg (02/21 1406) SpO2:  [95 %-100 %] 97 % (02/21 1358) Weight:  [63.73 kg (140 lb 8 oz)] 63.73 kg (140 lb 8 oz) (02/21 0456)  Physical Exam: BP Readings from Last 1 Encounters:  12/11/12 102/56     Wt Readings from Last 1 Encounters:  12/11/12 63.73 kg (140 lb 8 oz)    Weight change: -7.67 kg (-16 lb 14.5 oz)  HEENT: [place]/AT, Eyes-Brown, PERL, EOMI, Conjunctiva-Pink, Sclera-Non-icteric Neck: + JVD, No bruit, Trachea midline. Lungs:  Crackles at bases, bilaterally. Cardiac:  Regular rhythm, normal S1 and S2, no S3. III/VI systolic murmur. Abdomen:  Soft, distended, non-tender, tympanic anteriorly, dull on sides. Extremities:  No edema present. No cyanosis. No clubbing. CNS: AxOx3, Cranial nerves grossly intact, moves all 4 extremities. Right handed. Skin: Warm and dry.   Intake/Output from previous day: 02/20 0701 - 02/21 0700 In: 120 [P.O.:120] Out: 1350 [Urine:1350]    Lab Results: BMET    Component Value Date/Time   NA 128* 12/11/2012 0435   K 3.8 12/11/2012 0435   CL 95* 12/11/2012 0435   CO2 25 12/11/2012 0435   GLUCOSE 105* 12/11/2012 0435   BUN 31* 12/11/2012 0435   CREATININE 1.03 12/11/2012 0435   CALCIUM 8.1* 12/11/2012 0435   GFRNONAA 71* 12/11/2012 0435   GFRAA 82* 12/11/2012 0435   CBC    Component Value Date/Time   WBC 6.0 12/10/2012 0455   RBC 4.23 12/10/2012 0455   HGB 13.1 12/10/2012 0455   HCT 37.3* 12/10/2012 0455   PLT 180 12/10/2012 0455   MCV 88.2 12/10/2012 0455   MCH 31.0 12/10/2012 0455   MCHC 35.1 12/10/2012 0455   RDW 16.2* 12/10/2012 0455   LYMPHSABS 0.6* 12/09/2012 1737   MONOABS 0.7 12/09/2012 1737   EOSABS 0.2 12/09/2012 1737   BASOSABS 0.0 12/09/2012 1737   CARDIAC ENZYMES Lab Results  Component Value Date   CKTOTAL 72 11/01/2011   CKMB 3.0 11/01/2011   TROPONINI <0.30 12/10/2012    Scheduled Meds: . aspirin EC  81 mg Oral Daily  . carvedilol  3.125 mg Oral BID WC  . furosemide  80 mg Oral BID  . heparin  5,000 Units Subcutaneous Q8H  . metFORMIN  500 mg Oral BID WC  . metolazone  2.5 mg Oral Daily  . sodium chloride  3 mL Intravenous Q12H  . spironolactone  12.5 mg Oral Daily   Continuous Infusions:  PRN Meds:.sodium chloride, acetaminophen, ondansetron (ZOFRAN) IV, sodium chloride  Assessment/Plan:  Ascites - some re-accumulation of fluid Chronic left heart systolic failure.  Hypokalemia  DM, II  Dilated cardiomyopathy Hyponatremia  Add Zaroxolyn and spironolactone. Paracentesis Monday if not diuresing. Add Lanoxin as tolerated   LOS: 2 days    Orpah Cobb  MD  12/11/2012, 5:45 PM

## 2012-12-12 LAB — BASIC METABOLIC PANEL
Calcium: 8.5 mg/dL (ref 8.4–10.5)
GFR calc non Af Amer: 81 mL/min — ABNORMAL LOW (ref >90)
Sodium: 130 mEq/L — ABNORMAL LOW (ref 135–145)

## 2012-12-12 LAB — GLUCOSE, CAPILLARY
Glucose-Capillary: 98 mg/dL (ref 70–99)
Glucose-Capillary: 145 mg/dL — ABNORMAL HIGH (ref 70–99)
Glucose-Capillary: 173 mg/dL — ABNORMAL HIGH (ref 70–99)

## 2012-12-12 MED ORDER — POTASSIUM CHLORIDE CRYS ER 20 MEQ PO TBCR
40.0000 meq | EXTENDED_RELEASE_TABLET | Freq: Two times a day (BID) | ORAL | Status: DC
  Administered 2012-12-12 – 2012-12-14 (×5): 40 meq via ORAL
  Filled 2012-12-12 (×7): qty 2

## 2012-12-12 NOTE — Progress Notes (Signed)
Pt called RN due to pt having a nosebleed.  When questioned about nosebleed pt states he has been having them but not this severe.  Pt has blood draining down throat and pt is spitting it out.  Ice pack given to pt to hold on bridge of nose, HOB elevated and pt instructed to lean head forward.  Bleed is slowing will continue to monitor. Barnett Hatter P

## 2012-12-12 NOTE — Progress Notes (Signed)
Subjective:  Patient denies any chest pain states abdominal swelling and leg swelling improved. Patient does kill history of exertional dyspnea feeling tired fatigued weak. States had stress test few years ago.  Objective:  Vital Signs in the last 24 hours: Temp:  [97.2 F (36.2 C)-97.7 F (36.5 C)] 97.7 F (36.5 C) (02/22 0409) Pulse Rate:  [78-90] 79 (02/22 1038) Resp:  [16-20] 16 (02/22 0409) BP: (86-112)/(41-64) 105/57 mmHg (02/22 1038) SpO2:  [96 %-100 %] 100 % (02/22 1038) Weight:  [64.7 kg (142 lb 10.2 oz)] 64.7 kg (142 lb 10.2 oz) (02/22 0409)  Intake/Output from previous day: 02/21 0701 - 02/22 0700 In: 963 [P.O.:960; I.V.:3] Out: 3526 [Urine:3525; Stool:1] Intake/Output from this shift: Total I/O In: 120 [P.O.:120] Out: -   Physical Exam: Neck: JVD - 8 cm above sternal notch, no adenopathy, no carotid bruit and supple, symmetrical, trachea midline Lungs: Decreased breath sound at right base Heart: regular rate and rhythm, S1, S2 normal and 3/6 systolic murmur noted at lower left sternal border and at apex Radiating to axilla Abdomen: Soft distended nontender Extremities: No clubbing cyanosis 1+ edema noted  Lab Results:  Recent Labs  12/09/12 1737 12/10/12 0455  WBC 7.3 6.0  HGB 14.0 13.1  PLT 176 180    Recent Labs  12/11/12 0435 12/12/12 0613  NA 128* 130*  K 3.8 2.8*  CL 95* 93*  CO2 25 28  GLUCOSE 105* 99  BUN 31* 31*  CREATININE 1.03 0.96    Recent Labs  12/09/12 2306 12/10/12 0455  TROPONINI <0.30 <0.30   Hepatic Function Panel  Recent Labs  12/09/12 1737  PROT 6.7  ALBUMIN 2.9*  AST 23  ALT 11  ALKPHOS 76  BILITOT 0.9   No results found for this basename: CHOL,  in the last 72 hours No results found for this basename: PROTIME,  in the last 72 hours  Imaging: Imaging results have been reviewed and No results found.  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 Check labs in a.m.  LOS: 3 days    [name] N 12/12/2012, 11:51 AM

## 2012-12-13 LAB — BASIC METABOLIC PANEL
Calcium: 8.5 mg/dL (ref 8.4–10.5)
GFR calc Af Amer: 84 mL/min — ABNORMAL LOW (ref >90)
GFR calc non Af Amer: 72 mL/min — ABNORMAL LOW (ref >90)
Potassium: 3.1 mEq/L — ABNORMAL LOW (ref 3.5–5.1)
Sodium: 126 mEq/L — ABNORMAL LOW (ref 135–145)

## 2012-12-13 LAB — GLUCOSE, CAPILLARY: Glucose-Capillary: 204 mg/dL — ABNORMAL HIGH (ref 70–99)

## 2012-12-13 LAB — POTASSIUM: Potassium: 3.6 mEq/L (ref 3.5–5.1)

## 2012-12-13 LAB — MAGNESIUM: Magnesium: 2 mg/dL (ref 1.5–2.5)

## 2012-12-13 LAB — CBC
MCH: 31.4 pg (ref 26.0–34.0)
MCHC: 36.1 g/dL — ABNORMAL HIGH (ref 30.0–36.0)
Platelets: 161 10*3/uL (ref 150–400)

## 2012-12-13 MED ORDER — MAGNESIUM SULFATE 40 MG/ML IJ SOLN
2.0000 g | Freq: Once | INTRAMUSCULAR | Status: AC
  Administered 2012-12-13: 2 g via INTRAVENOUS
  Filled 2012-12-13: qty 50

## 2012-12-13 MED ORDER — FUROSEMIDE 40 MG PO TABS
40.0000 mg | ORAL_TABLET | Freq: Two times a day (BID) | ORAL | Status: DC
  Administered 2012-12-13 – 2012-12-14 (×2): 40 mg via ORAL
  Filled 2012-12-13 (×4): qty 1

## 2012-12-13 MED ORDER — POTASSIUM CHLORIDE CRYS ER 20 MEQ PO TBCR
20.0000 meq | EXTENDED_RELEASE_TABLET | Freq: Once | ORAL | Status: AC
  Administered 2012-12-13: 20 meq via ORAL
  Filled 2012-12-13: qty 1

## 2012-12-13 NOTE — Progress Notes (Signed)
Subjective:  Patient denies any chest pain or shortness of breath. Overall feels better Denies any palpitations. Frequent PVCs and couplets noted  Objective:  Vital Signs in the last 24 hours: Temp:  [97.5 F (36.4 C)-97.9 F (36.6 C)] 97.5 F (36.4 C) (02/23 0407) Pulse Rate:  [72-78] 72 (02/23 0407) Resp:  [16-17] 17 (02/23 0407) BP: (98-116)/(45-80) 116/58 mmHg (02/23 0758) SpO2:  [97 %-98 %] 98 % (02/23 0407) Weight:  [64.003 kg (141 lb 1.6 oz)] 64.003 kg (141 lb 1.6 oz) (02/23 0407)  Intake/Output from previous day: 02/22 0701 - 02/23 0700 In: 600 [P.O.:600] Out: 2800 [Urine:2800] Intake/Output from this shift: Total I/O In: -  Out: 400 [Urine:400]  Physical Exam: Neck: no adenopathy, no carotid bruit and supple, symmetrical, trachea midline Lungs: Decreased breath sounds at right base  Heart: regular rate and rhythm, S1, S2 normal and 3/6 systolic murmur noted Abdomen: Soft distended nontender Extremities: No clubbing cyanosis ankle edema noted  Lab Results:  Recent Labs  12/13/12 0520  WBC 5.8  HGB 12.9*  PLT 161    Recent Labs  12/12/12 0613 12/12/12 2334 12/13/12 0520  NA 130*  --  126*  K 2.8* 3.6 3.1*  CL 93*  --  89*  CO2 28  --  29  GLUCOSE 99  --  118*  BUN 31*  --  33*  CREATININE 0.96  --  1.01   No results found for this basename: TROPONINI, CK, MB,  in the last 72 hours Hepatic Function Panel No results found for this basename: PROT, ALBUMIN, AST, ALT, ALKPHOS, BILITOT, BILIDIR, IBILI,  in the last 72 hours No results found for this basename: CHOL,  in the last 72 hours No results found for this basename: PROTIME,  in the last 72 hours  Imaging: Imaging results have been reviewed and No results found.  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   LOS: 4 days    Calen Geister N 12/13/2012, 11:30 AM

## 2012-12-13 NOTE — Progress Notes (Signed)
MD returned call concerning pt's heart rhythm and low potassium level. Orders given and enacted. No orders given for follow up labs.  Will continue to monitor.

## 2012-12-13 NOTE — Progress Notes (Signed)
MD paged concerning Pt's potassium level and heart rhythm. Awaiting call back. Will continue to monitor.

## 2012-12-13 NOTE — Progress Notes (Signed)
No return call. MD paged again. Awaiting call back. Will continue to monitor.

## 2012-12-13 NOTE — Progress Notes (Signed)
MD returned call concerning labs and heart rhythm issues. New orders given and enacted. No further labs ordered for today. Will continue to monitor.

## 2012-12-13 NOTE — Progress Notes (Signed)
RN called to pt bedside, Pt's nose was bleeding. HOB elevated, pt given ice pack to hold on bridge of nose. Bleeding stopped. Will continue to monitor.

## 2012-12-13 NOTE — Progress Notes (Signed)
MD paged concerning Pt's lab results. Awaiting call back.

## 2012-12-14 LAB — BASIC METABOLIC PANEL
Chloride: 92 mEq/L — ABNORMAL LOW (ref 96–112)
Creatinine, Ser: 0.89 mg/dL (ref 0.50–1.35)
GFR calc Af Amer: >90 mL/min (ref >90)
Potassium: 3.3 mEq/L — ABNORMAL LOW (ref 3.5–5.1)
Sodium: 130 mEq/L — ABNORMAL LOW (ref 135–145)

## 2012-12-14 LAB — MAGNESIUM: Magnesium: 1.7 mg/dL (ref 1.5–2.5)

## 2012-12-14 MED ORDER — FUROSEMIDE 40 MG PO TABS: 40.0000 mg | ORAL_TABLET | Freq: Two times a day (BID) | ORAL | Status: DC

## 2012-12-14 MED ORDER — METOLAZONE 2.5 MG PO TABS: 2.5000 mg | ORAL_TABLET | ORAL | Status: DC

## 2012-12-14 MED ORDER — SPIRONOLACTONE 12.5 MG HALF TABLET: 12.5000 mg | ORAL_TABLET | Freq: Every day | ORAL | Status: DC

## 2012-12-14 NOTE — Discharge Summary (Signed)
Physician Discharge Summary  Patient ID: RIDGE LAFOND MRN: [id number] DOB/AGE: January 08, 1941 72 y.o.  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 Some shortness of breath without leg edema. He diuresed well with lasix, spironolactone and zaroxolyn after 6.8 L therapeutic paracentesis.   Consults: Interventional radiology  Significant Diagnostic Studies: labs: Normal CBC, Chronic hyponatremia. BUN 31, Cr-1.03, glucose 105  Treatments: cardiac meds:  carvedilol, furosemide and Spironolactone.  Discharge Exam: Blood pressure 103/63, pulse 86, temperature 98.1 F (36.7 C), temperature source Oral, resp. rate 18, height 5\' 5"  (1.651 m), weight 63.141 kg (139 lb 3.2 oz), SpO2 99.00%. HEENT: [place]/AT, Eyes-Brown, PERL, EOMI, Conjunctiva-Pink, Sclera-Non-icteric  Neck: + JVD, No bruit, Trachea midline.  Lungs: Crackles at bases, bilaterally.  Cardiac: Regular rhythm, normal S1 and S2, no S3. III/VI systolic murmur.  Abdomen: Soft, distended, non-tender, tympanic anteriorly, dull on sides.  Extremities: No edema present. No cyanosis. No clubbing.  CNS: AxOx3, Cranial nerves grossly intact, moves all 4 extremities. Right handed.  Skin: Warm and dry.   Disposition: 01-Home or Self Care     Medication List    TAKE these medications       carvedilol 3.125 MG  tablet  Commonly known as:  COREG  Take 1 tablet (3.125 mg total) by mouth 2 (two) times daily with a meal.     furosemide 40 MG tablet  Commonly known as:  LASIX  Take 1 tablet (40 mg total) by mouth 2 (two) times daily.     glipiZIDE 5 MG 24 hr tablet  Commonly known as:  GLUCOTROL XL  Take 5 mg by mouth daily.     HUMULIN 70/30 [place]  Inject 15-20 Units into the skin daily as needed. Per sliding scale.     HUMULIN 70/30 [place]  Inject 10-20 Units into the skin daily as needed (if CBG is greater than 150 take 10 units; if greater than 200 take 20 units).     metFORMIN 500 MG tablet  Commonly known as:  GLUCOPHAGE  Take 500 mg by mouth 2 (two) times daily with a meal.     metolazone 2.5 MG tablet  Commonly known as:  ZAROXOLYN  Take 1 tablet (2.5 mg total) by mouth every other day.     miconazole 2 % powder  Commonly known as:  MICOTIN  Apply 1 application topically as needed for itching (to back of knee).     potassium chloride SA 20 MEQ tablet  Commonly known as:  K-DUR,KLOR-CON  Take 20 mEq by mouth 2 (two) times daily.     spironolactone 12.5 mg Tabs  Commonly known as:  ALDACTONE  Take 0.5 tablets (12.5 mg total) by mouth daily.           Follow-up Information   Follow up with [facility], MD. Schedule an appointment as soon as possible for a visit in 1 week.   Contact information:  [address] [place] [address] [phone]       Signed: [name] 12/14/2012, 8:47 AM

## 2012-12-19 HISTORY — PX: CARDIAC CATHETERIZATION: SHX172

## 2013-01-12 ENCOUNTER — Inpatient Hospital Stay (HOSPITAL_COMMUNITY): Payer: Medicare HMO

## 2013-01-12 ENCOUNTER — Inpatient Hospital Stay (HOSPITAL_COMMUNITY)
Admission: AD | Admit: 2013-01-12 | Discharge: 2013-01-15 | DRG: 287 | Disposition: A | Discharge status: Home / Self Care | Payer: Medicare HMO | Source: Ambulatory Visit | Attending: Cardiovascular Disease | Admitting: Cardiovascular Disease

## 2013-01-12 DIAGNOSIS — I5022 Chronic systolic (congestive) heart failure: Secondary | ICD-10-CM | POA: Diagnosis present

## 2013-01-12 DIAGNOSIS — Z87891 Personal history of nicotine dependence: Secondary | ICD-10-CM

## 2013-01-12 DIAGNOSIS — J449 Chronic obstructive pulmonary disease, unspecified: Secondary | ICD-10-CM | POA: Diagnosis present

## 2013-01-12 DIAGNOSIS — I428 Other cardiomyopathies: Secondary | ICD-10-CM | POA: Diagnosis present

## 2013-01-12 DIAGNOSIS — Z794 Long term (current) use of insulin: Secondary | ICD-10-CM

## 2013-01-12 DIAGNOSIS — I129 Hypertensive chronic kidney disease with stage 1 through stage 4 chronic kidney disease, or unspecified chronic kidney disease: Secondary | ICD-10-CM | POA: Diagnosis present

## 2013-01-12 DIAGNOSIS — F329 Major depressive disorder, single episode, unspecified: Secondary | ICD-10-CM | POA: Diagnosis present

## 2013-01-12 DIAGNOSIS — I4891 Unspecified atrial fibrillation: Secondary | ICD-10-CM | POA: Diagnosis present

## 2013-01-12 DIAGNOSIS — I2510 Atherosclerotic heart disease of native coronary artery without angina pectoris: Principal | ICD-10-CM | POA: Diagnosis present

## 2013-01-12 DIAGNOSIS — F3289 Other specified depressive episodes: Secondary | ICD-10-CM | POA: Diagnosis present

## 2013-01-12 DIAGNOSIS — M109 Gout, unspecified: Secondary | ICD-10-CM | POA: Diagnosis present

## 2013-01-12 DIAGNOSIS — Z86711 Personal history of pulmonary embolism: Secondary | ICD-10-CM

## 2013-01-12 DIAGNOSIS — I509 Heart failure, unspecified: Secondary | ICD-10-CM | POA: Diagnosis present

## 2013-01-12 DIAGNOSIS — I2582 Chronic total occlusion of coronary artery: Secondary | ICD-10-CM | POA: Diagnosis present

## 2013-01-12 DIAGNOSIS — Z882 Allergy status to sulfonamides status: Secondary | ICD-10-CM

## 2013-01-12 DIAGNOSIS — E119 Type 2 diabetes mellitus without complications: Secondary | ICD-10-CM | POA: Diagnosis present

## 2013-01-12 DIAGNOSIS — E871 Hypo-osmolality and hyponatremia: Secondary | ICD-10-CM | POA: Diagnosis present

## 2013-01-12 DIAGNOSIS — N189 Chronic kidney disease, unspecified: Secondary | ICD-10-CM | POA: Diagnosis present

## 2013-01-12 DIAGNOSIS — Z79899 Other long term (current) drug therapy: Secondary | ICD-10-CM

## 2013-01-12 DIAGNOSIS — E7800 Pure hypercholesterolemia, unspecified: Secondary | ICD-10-CM | POA: Diagnosis present

## 2013-01-12 DIAGNOSIS — Z951 Presence of aortocoronary bypass graft: Secondary | ICD-10-CM

## 2013-01-12 DIAGNOSIS — R188 Other ascites: Secondary | ICD-10-CM | POA: Diagnosis present

## 2013-01-12 DIAGNOSIS — J4489 Other specified chronic obstructive pulmonary disease: Secondary | ICD-10-CM | POA: Diagnosis present

## 2013-01-12 DIAGNOSIS — E876 Hypokalemia: Secondary | ICD-10-CM | POA: Diagnosis present

## 2013-01-12 LAB — CBC WITH DIFFERENTIAL/PLATELET
Basophils Absolute: 0 10*3/uL (ref 0.0–0.1)
Basophils Relative: 1 % (ref 0–1)
HCT: 41.2 % (ref 39.0–52.0)
Hemoglobin: 14.6 g/dL (ref 13.0–17.0)
Lymphocytes Relative: 12 % (ref 12–46)
MCHC: 35.4 g/dL (ref 30.0–36.0)
Monocytes Absolute: 0.9 10*3/uL (ref 0.1–1.0)
Neutro Abs: 6.2 10*3/uL (ref 1.7–7.7)
Neutrophils Relative %: 75 % (ref 43–77)
RDW: 15.2 % (ref 11.5–15.5)
WBC: 8.3 10*3/uL (ref 4.0–10.5)

## 2013-01-12 LAB — TROPONIN I: Troponin I: <0.3 ng/mL (ref <0.30)

## 2013-01-12 LAB — COMPREHENSIVE METABOLIC PANEL
ALT: 14 U/L (ref 0–53)
AST: 26 U/L (ref 0–37)
Albumin: 3.7 g/dL (ref 3.5–5.2)
Alkaline Phosphatase: 98 U/L (ref 39–117)
CO2: 30 mEq/L (ref 19–32)
Chloride: 92 mEq/L — ABNORMAL LOW (ref 96–112)
GFR calc non Af Amer: 59 mL/min — ABNORMAL LOW (ref >90)
Potassium: 4.1 mEq/L (ref 3.5–5.1)
Total Bilirubin: 1.2 mg/dL (ref 0.3–1.2)

## 2013-01-12 LAB — APTT: aPTT: 40 seconds — ABNORMAL HIGH (ref 24–37)

## 2013-01-12 LAB — PROTIME-INR: INR: 1.13 (ref 0.00–1.49)

## 2013-01-12 LAB — GLUCOSE, CAPILLARY: Glucose-Capillary: 180 mg/dL — ABNORMAL HIGH (ref 70–99)

## 2013-01-12 MED ORDER — ASPIRIN EC 81 MG PO TBEC
81.0000 mg | DELAYED_RELEASE_TABLET | Freq: Every day | ORAL | Status: DC
  Administered 2013-01-14 – 2013-01-15 (×2): 81 mg via ORAL
  Filled 2013-01-12 (×3): qty 1

## 2013-01-12 MED ORDER — METOLAZONE 2.5 MG PO TABS
2.5000 mg | ORAL_TABLET | ORAL | Status: DC
  Filled 2013-01-12: qty 1

## 2013-01-12 MED ORDER — SODIUM CHLORIDE 0.9 % IJ SOLN: 3.0000 mL | INTRAMUSCULAR | Status: DC | PRN

## 2013-01-12 MED ORDER — SODIUM CHLORIDE 0.9 % IJ SOLN
3.0000 mL | Freq: Two times a day (BID) | INTRAMUSCULAR | Status: DC
  Administered 2013-01-14 – 2013-01-15 (×2): 3 mL via INTRAVENOUS

## 2013-01-12 MED ORDER — FUROSEMIDE 40 MG PO TABS
40.0000 mg | ORAL_TABLET | Freq: Two times a day (BID) | ORAL | Status: DC
  Administered 2013-01-12 – 2013-01-15 (×6): 40 mg via ORAL
  Filled 2013-01-12 (×8): qty 1

## 2013-01-12 MED ORDER — HEPARIN BOLUS VIA INFUSION
3000.0000 [IU] | Freq: Once | INTRAVENOUS | Status: AC
  Administered 2013-01-12: 3000 [IU] via INTRAVENOUS
  Filled 2013-01-12: qty 3000

## 2013-01-12 MED ORDER — SODIUM CHLORIDE 0.9 % IJ SOLN
3.0000 mL | Freq: Two times a day (BID) | INTRAMUSCULAR | Status: DC
  Administered 2013-01-12 – 2013-01-13 (×2): 3 mL via INTRAVENOUS

## 2013-01-12 MED ORDER — CARVEDILOL 3.125 MG PO TABS
3.1250 mg | ORAL_TABLET | Freq: Two times a day (BID) | ORAL | Status: DC
  Administered 2013-01-12 – 2013-01-15 (×6): 3.125 mg via ORAL
  Filled 2013-01-12 (×8): qty 1

## 2013-01-12 MED ORDER — ASPIRIN 81 MG PO CHEW
324.0000 mg | CHEWABLE_TABLET | ORAL | Status: AC
  Administered 2013-01-13: 324 mg via ORAL
  Filled 2013-01-12: qty 4

## 2013-01-12 MED ORDER — ALPRAZOLAM 0.25 MG PO TABS: 0.2500 mg | ORAL_TABLET | Freq: Two times a day (BID) | ORAL | Status: DC | PRN

## 2013-01-12 MED ORDER — ASPIRIN 300 MG RE SUPP
300.0000 mg | RECTAL | Status: AC
  Filled 2013-01-12: qty 1

## 2013-01-12 MED ORDER — SODIUM CHLORIDE 0.9 % IV SOLN: 250.0000 mL | INTRAVENOUS | Status: DC | PRN

## 2013-01-12 MED ORDER — DIAZEPAM 5 MG PO TABS
5.0000 mg | ORAL_TABLET | ORAL | Status: AC
  Administered 2013-01-13: 5 mg via ORAL
  Filled 2013-01-12: qty 1

## 2013-01-12 MED ORDER — ACETAMINOPHEN 325 MG PO TABS: 650.0000 mg | ORAL_TABLET | ORAL | Status: DC | PRN

## 2013-01-12 MED ORDER — ONDANSETRON HCL 4 MG/2ML IJ SOLN: 4.0000 mg | Freq: Four times a day (QID) | INTRAMUSCULAR | Status: DC | PRN

## 2013-01-12 MED ORDER — GLIPIZIDE ER 5 MG PO TB24
5.0000 mg | ORAL_TABLET | Freq: Every day | ORAL | Status: DC
  Administered 2013-01-14 – 2013-01-15 (×2): 5 mg via ORAL
  Filled 2013-01-12 (×4): qty 1

## 2013-01-12 MED ORDER — HEPARIN (PORCINE) IN NACL 100-0.45 UNIT/ML-% IJ SOLN
900.0000 [IU]/h | INTRAMUSCULAR | Status: DC
  Administered 2013-01-12: 900 [IU]/h via INTRAVENOUS
  Filled 2013-01-12 (×2): qty 250

## 2013-01-12 MED ORDER — POTASSIUM CHLORIDE CRYS ER 20 MEQ PO TBCR
20.0000 meq | EXTENDED_RELEASE_TABLET | Freq: Two times a day (BID) | ORAL | Status: DC
  Administered 2013-01-12 – 2013-01-15 (×6): 20 meq via ORAL
  Filled 2013-01-12 (×7): qty 1

## 2013-01-12 MED ORDER — INSULIN ASPART 100 UNIT/ML ~~LOC~~ SOLN
0.0000 [IU] | Freq: Three times a day (TID) | SUBCUTANEOUS | Status: DC
  Administered 2013-01-12: 3 [IU] via SUBCUTANEOUS
  Administered 2013-01-13: 1 [IU] via SUBCUTANEOUS
  Administered 2013-01-14: 3 [IU] via SUBCUTANEOUS
  Administered 2013-01-14: 1 [IU] via SUBCUTANEOUS
  Administered 2013-01-15: 2 [IU] via SUBCUTANEOUS
  Administered 2013-01-15: 1 [IU] via SUBCUTANEOUS

## 2013-01-12 MED ORDER — NITROGLYCERIN 0.4 MG SL SUBL: 0.4000 mg | SUBLINGUAL_TABLET | SUBLINGUAL | Status: DC | PRN

## 2013-01-12 MED ORDER — DIAZEPAM 5 MG PO TABS: 5.0000 mg | ORAL_TABLET | ORAL | Status: AC

## 2013-01-12 MED ORDER — SPIRONOLACTONE 12.5 MG HALF TABLET
12.5000 mg | ORAL_TABLET | Freq: Every day | ORAL | Status: DC
  Administered 2013-01-13 – 2013-01-15 (×3): 12.5 mg via ORAL
  Filled 2013-01-12 (×3): qty 1

## 2013-01-12 MED ORDER — METOLAZONE 2.5 MG PO TABS
2.5000 mg | ORAL_TABLET | ORAL | Status: DC
  Administered 2013-01-13: 2.5 mg via ORAL
  Filled 2013-01-12: qty 1

## 2013-01-12 NOTE — Progress Notes (Signed)
ANTICOAGULATION CONSULT NOTE - Initial Consult  Pharmacy Consult for heparin  Indication: chest pain/ACS  Allergies  Allergen Reactions  . Sulfa Antibiotics Hives    Patient Measurements:  Wt today 71.4 kg 2nd ascites, wt 12/14/12 = 63.1 kg Vital Signs: Temp: 97.1 F (36.2 C) (03/25 1437) Temp src: Oral (03/25 1437) BP: 92/66 mmHg (03/25 1437) Pulse Rate: 69 (03/25 1437)  Labs: No results found for this basename: HGB, HCT, PLT, APTT, LABPROT, INR, HEPARINUNFRC, CREATININE, CKTOTAL, CKMB, TROPONINI,  in the last 72 hours  The CrCl is unknown because both a height and weight (above a minimum accepted value) are required for this calculation.   Medical History: Past Medical History  Diagnosis Date  . Shortness of breath   . Coronary artery disease   . Hypertension   . CHF (congestive heart failure)   . COPD (chronic obstructive pulmonary disease)   . High cholesterol   . Angina   . Pulmonary embolism 1980's  . Bronchitis   . Pneumonia   . 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 Gout   . Chronic kidney disease     renal insufficiency  . Diabetes mellitus     type 2    Medications:  Prescriptions prior to admission  Medication Sig Dispense Refill  . carvedilol (COREG) 3.125 MG tablet Take 1 tablet (3.125 mg total) by mouth 2 (two) times daily with a meal.      . furosemide (LASIX) 40 MG tablet Take 1 tablet (40 mg total) by mouth 2 (two) times daily.  30 tablet  1  . glipiZIDE (GLUCOTROL XL) 5 MG 24 hr tablet Take 5 mg by mouth daily.      . Insulin Isophane & Regular (HUMULIN 70/30 [place]) Inject 15-20 Units into the skin daily as needed. Per sliding scale.      . Insulin Isophane & Regular (HUMULIN 70/30 [place]) Inject 10-20 Units into the skin daily as needed (if CBG is greater than 150 take 10 units; if greater than 200 take 20 units).      . metFORMIN (GLUCOPHAGE) 500 MG tablet Take 500 mg by mouth 2 (two) times daily with a meal.      . metolazone (ZAROXOLYN) 2.5  MG tablet Take 1 tablet (2.5 mg total) by mouth every other day.  15 tablet  1  . miconazole (MICOTIN) 2 % powder Apply 1 application topically as needed for itching (to back of knee).      . potassium chloride SA (K-DUR,KLOR-CON) 20 MEQ tablet Take 20 mEq by mouth 2 (two) times daily.      Marland Kitchen spironolactone (ALDACTONE) 12.5 mg TABS Take 0.5 tablets (12.5 mg total) by mouth daily.  15 tablet  1    Assessment: [name] is a 72 yo man admitted with DOE and known CAD to start heparin per pharmacy for ACS/rule out MI.  His wt on 2/24 was 63.1 kg and today it is 71.4 kg 2nd ascites.  A right a nd left heart cath is planned for this admission.    Goal of Therapy:  Heparin level 0.3-0.7 units/ml Monitor platelets by anticoagulation protocol: Yes   Plan: 1. Heparin 3000 unit bolus 2. Heparin drip at 900 units/hr 3. 8 hr heparin level 4. Daily HL and CBC while on heparin Herby Abraham, Pharm.D. [phone] 01/12/2013 2:46 PM

## 2013-01-12 NOTE — H&P (Signed)
[name] is an 72 y.o. male.   Chief Complaint: Exertional dyspnea and known CAD. HPI: 72 years old male with known CAD, CABG, DM, II and CHF with ascites has exertional dyspnea. Minimal right and mild left leg edema. No fever or cough. He is now ready for R + L heart cath.  Past Medical History  Diagnosis Date  . Shortness of breath   . Coronary artery disease   . Hypertension   . CHF (congestive heart failure)   . COPD (chronic obstructive pulmonary disease)   . High cholesterol   . Angina   . Pulmonary embolism 1980's  . Bronchitis   . Pneumonia   . 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 Gout   . Chronic kidney disease     renal insufficiency  . Diabetes mellitus     type 2      Past Surgical History  Procedure Laterality Date  . Retinal laser procedure      "both eyes; more times on the left"  . Cataract extraction w/ intraocular lens  implant, bilateral  2005  . Appendectomy  ~ 1962  . Cardiac catheterization    . Cervical discectomy  1984    C6-7  . 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 Social History:  reports that he quit smoking about 39 years ago. His smoking use included Cigarettes. He has a 5 pack-year smoking history. He has quit using smokeless tobacco. His smokeless tobacco use included Chew. He reports that he drinks about 1.0 ounces of alcohol per week. He reports that he does not use illicit drugs.  Allergies:  Allergies  Allergen Reactions  . Sulfa Antibiotics Hives    Medications Prior to Admission  Medication Sig Dispense Refill  . carvedilol (COREG) 3.125 MG tablet Take 1 tablet (3.125 mg total) by mouth 2 (two) times daily with a meal.      . furosemide (LASIX) 40 MG tablet Take 1 tablet (40 mg total) by mouth 2 (two) times daily.  30 tablet  1  . glipiZIDE (GLUCOTROL XL) 5 MG 24 hr tablet Take 5 mg by mouth daily.      . Insulin Isophane & Regular (HUMULIN 70/30 [place])  Inject 15-20 Units into the skin daily as needed. Per sliding scale.      . Insulin Isophane & Regular (HUMULIN 70/30 [place]) Inject 10-20 Units into the skin daily as needed (if CBG is greater than 150 take 10 units; if greater than 200 take 20 units).      . metFORMIN (GLUCOPHAGE) 500 MG tablet Take 500 mg by mouth 2 (two) times daily with a meal.      . metolazone (ZAROXOLYN) 2.5 MG tablet Take 1 tablet (2.5 mg total) by mouth every other day.  15 tablet  1  . miconazole (MICOTIN) 2 % powder Apply 1 application topically as needed for itching (to back of knee).      . potassium chloride SA (K-DUR,KLOR-CON) 20 MEQ tablet Take 20 mEq by mouth 2 (two) times daily.      Marland Kitchen spironolactone (ALDACTONE) 12.5 mg TABS Take 0.5 tablets (12.5 mg total) by mouth daily.  15 tablet  1    No results found for this or any previous visit (from the past 48 hour(s)). No results found.  @ROS @ Constitutional: Positive for malaise/fatigue. Negative for fever,  chills, weight loss and diaphoresis.  HENT: Negative for hearing loss, nosebleeds, sore throat and ear discharge.  Eyes: Negative for blurred vision, pain and discharge.  Respiratory: Positive for shortness of breath. Negative for cough and sputum production.  Cardiovascular: Positive for orthopnea. Negative for chest pain and leg swelling.  Gastrointestinal: Positive for abdominal pain. Negative for heartburn, nausea and constipation. + ascites.  Genitourinary: Negative for dysuria and urgency.  Musculoskeletal: Positive for joint pain. Negative for myalgias and back pain.  Skin: Negative for rash.  Neurological: Positive for weakness. Negative for dizziness, tremors, focal weakness, seizures and headaches.  Psychiatric/Behavioral: Positive for depression. Negative for suicidal ideas, memory loss and substance abuse. The patient is not nervous/anxious and does not have insomnia.   Blood pressure 106/70, pulse 86, temperature 97 F (36.1 C), temperature source  Oral, resp. rate 18, SpO2 97.00%.  Constitutional: He appears averagely-developed and nourished.  HENT: Head: Normocephalic and atraumatic. Mouth/Throat: No oropharyngeal exudate.  Eyes: Conjunctivae are normal. Pupils are equal, round, and reactive to light. No scleral icterus.  Neck: Normal range of motion. JVD present. No tracheal deviation present. No thyromegaly present.  Cardiovascular: Normal rate and regular rhythm. III/VI systolic murmur heard.  Respiratory: He is in no respiratory distress. He has no rales.  GI: Soft. He exhibits marked distension. There is no tenderness. There is no rebound.  Musculoskeletal: He exhibits no edema and no tenderness.  Lymphadenopathy: He has no cervical adenopathy.  Neurological: He is alert and oriented to person, place, and time. He displays normal reflexes. No cranial nerve deficit.  Skin: Skin is warm and dry.  Psychiatric: He has a normal mood and affect.   Assessment/Plan CAD CABG Ascites  Chronic left heart systolic failure.  Hypokalemia  DM, II  Dilated cardiomyopathy  R + L heart cath.  Indigo Barbian S 01/12/2013, 2:34 PM

## 2013-01-13 ENCOUNTER — Encounter (HOSPITAL_COMMUNITY)
Admission: AD | Disposition: A | Discharge status: Home / Self Care | Payer: Self-pay | Source: Ambulatory Visit | Attending: Cardiovascular Disease

## 2013-01-13 ENCOUNTER — Encounter (HOSPITAL_COMMUNITY): Payer: Self-pay

## 2013-01-13 HISTORY — PX: LEFT AND RIGHT HEART CATHETERIZATION WITH CORONARY/GRAFT ANGIOGRAM: SHX5448

## 2013-01-13 LAB — CBC
MCH: 31.1 pg (ref 26.0–34.0)
MCH: 30.7 pg (ref 26.0–34.0)
MCHC: 35 g/dL (ref 30.0–36.0)
MCHC: 34.4 g/dL (ref 30.0–36.0)
MCV: 88.7 fL (ref 78.0–100.0)
Platelets: 179 10*3/uL (ref 150–400)
Platelets: 152 10*3/uL (ref 150–400)
RBC: 3.99 MIL/uL — ABNORMAL LOW (ref 4.22–5.81)
RBC: 3.94 MIL/uL — ABNORMAL LOW (ref 4.22–5.81)

## 2013-01-13 LAB — GLUCOSE, CAPILLARY
Glucose-Capillary: 99 mg/dL (ref 70–99)
Glucose-Capillary: 210 mg/dL — ABNORMAL HIGH (ref 70–99)

## 2013-01-13 LAB — POCT I-STAT 3, VENOUS BLOOD GAS (G3P V)
Acid-Base Excess: 4 mmol/L — ABNORMAL HIGH (ref 0.0–2.0)
Bicarbonate: 29.6 mEq/L — ABNORMAL HIGH (ref 20.0–24.0)
O2 Saturation: 65 %
TCO2: 31 mmol/L (ref 0–100)
pCO2, Ven: 46.4 mmHg (ref 45.0–50.0)
pO2, Ven: 34 mmHg (ref 30.0–45.0)

## 2013-01-13 LAB — TROPONIN I: Troponin I: <0.3 ng/mL (ref <0.30)

## 2013-01-13 LAB — POCT I-STAT 3, ART BLOOD GAS (G3+)
Acid-Base Excess: 3 mmol/L — ABNORMAL HIGH (ref 0.0–2.0)
O2 Saturation: 95 %
TCO2: 29 mmol/L (ref 0–100)

## 2013-01-13 LAB — LIPID PANEL
HDL: 33 mg/dL — ABNORMAL LOW (ref >39)
LDL Cholesterol: 115 mg/dL — ABNORMAL HIGH (ref 0–99)
Total CHOL/HDL Ratio: 4.8 RATIO
VLDL: 12 mg/dL (ref 0–40)

## 2013-01-13 LAB — BASIC METABOLIC PANEL
BUN: 67 mg/dL — ABNORMAL HIGH (ref 6–23)
CO2: 28 mEq/L (ref 19–32)
Calcium: 8.8 mg/dL (ref 8.4–10.5)
Creatinine, Ser: 1.51 mg/dL — ABNORMAL HIGH (ref 0.50–1.35)
GFR calc non Af Amer: 44 mL/min — ABNORMAL LOW (ref >90)
Glucose, Bld: 184 mg/dL — ABNORMAL HIGH (ref 70–99)
Sodium: 129 mEq/L — ABNORMAL LOW (ref 135–145)

## 2013-01-13 LAB — CREATININE, SERUM: Creatinine, Ser: 1.25 mg/dL (ref 0.50–1.35)

## 2013-01-13 LAB — HEPARIN LEVEL (UNFRACTIONATED): Heparin Unfractionated: 0.37 IU/mL (ref 0.30–0.70)

## 2013-01-13 SURGERY — LEFT AND RIGHT HEART CATHETERIZATION WITH CORONARY/GRAFT ANGIOGRAM

## 2013-01-13 MED ORDER — FENTANYL CITRATE 0.05 MG/ML IJ SOLN
INTRAMUSCULAR | Status: AC
  Filled 2013-01-13: qty 2

## 2013-01-13 MED ORDER — SODIUM CHLORIDE 0.9 % IV SOLN: INTRAVENOUS | Status: AC

## 2013-01-13 MED ORDER — ASPIRIN 81 MG PO CHEW
324.0000 mg | CHEWABLE_TABLET | Freq: Once | ORAL | Status: AC
  Administered 2013-01-13: 324 mg via ORAL
  Filled 2013-01-13: qty 4

## 2013-01-13 MED ORDER — HEPARIN SODIUM (PORCINE) 5000 UNIT/ML IJ SOLN
5000.0000 [IU] | Freq: Three times a day (TID) | INTRAMUSCULAR | Status: DC
  Administered 2013-01-14 – 2013-01-15 (×5): 5000 [IU] via SUBCUTANEOUS
  Filled 2013-01-13 (×7): qty 1

## 2013-01-13 MED ORDER — HEPARIN (PORCINE) IN NACL 2-0.9 UNIT/ML-% IJ SOLN
INTRAMUSCULAR | Status: AC
  Filled 2013-01-13: qty 1000

## 2013-01-13 MED ORDER — LIDOCAINE HCL (PF) 1 % IJ SOLN
INTRAMUSCULAR | Status: AC
  Filled 2013-01-13: qty 30

## 2013-01-13 NOTE — Progress Notes (Signed)
ANTICOAGULATION CONSULT NOTE - Follow Up  Pharmacy Consult for heparin  Indication: chest pain/ACS  Allergies  Allergen Reactions  . Sulfa Antibiotics Hives    Patient Measurements: Weight: 141 lb 15.6 oz (64.4 kg)Wt today 71.4 kg 2nd ascites, wt 12/14/12 = 63.1 kg Vital Signs: Temp: 97.6 F (36.4 C) (03/25 1919) Temp src: Oral (03/25 1919) BP: 116/69 mmHg (03/25 1919) Pulse Rate: 90 (03/25 1919)  Labs:  Recent Labs  01/12/13 1523 01/12/13 1524 01/12/13 2024 01/13/13 0130  HGB 14.6  --   --  12.4*  HCT 41.2  --   --  35.4*  PLT 199  --   --  179  APTT 40*  --   --   --   LABPROT 14.3  --   --  15.2  INR 1.13  --   --  1.22  HEPARINUNFRC  --   --   --  0.37  CREATININE 1.20  --   --   --   TROPONINI  --  <0.30 <0.30  --     The CrCl is unknown because both a height and weight (above a minimum accepted value) are required for this calculation.   Medical History: Past Medical History  Diagnosis Date  . Shortness of breath   . Coronary artery disease   . Hypertension   . CHF (congestive heart failure)   . COPD (chronic obstructive pulmonary disease)   . High cholesterol   . Angina   . Pulmonary embolism 1980's  . Bronchitis   . Pneumonia   . 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 Gout   . Chronic kidney disease     renal insufficiency  . Diabetes mellitus     type 2    Medications:  Prescriptions prior to admission  Medication Sig Dispense Refill  . carvedilol (COREG) 3.125 MG tablet Take 1 tablet (3.125 mg total) by mouth 2 (two) times daily with a meal.      . furosemide (LASIX) 40 MG tablet Take 1 tablet (40 mg total) by mouth 2 (two) times daily.  30 tablet  1  . glipiZIDE (GLUCOTROL XL) 5 MG 24 hr tablet Take 5 mg by mouth daily.      . insulin NPH-insulin regular (HUMULIN 70/30) (70-30) 100 UNIT/ML injection Inject 5-10 Units into the skin daily as needed (Per Sliding Scale).      . metFORMIN (GLUCOPHAGE) 500 MG tablet Take 500 mg by mouth  2 (two) times daily with a meal.      . metolazone (ZAROXOLYN) 2.5 MG tablet Take 1 tablet (2.5 mg total) by mouth every other day.  15 tablet  1  . miconazole (MICOTIN) 2 % powder Apply 1 application topically as needed for itching (to back of knee).      . potassium chloride SA (K-DUR,KLOR-CON) 20 MEQ tablet Take 40 mEq by mouth 2 (two) times daily.      Marland Kitchen spironolactone (ALDACTONE) 12.5 mg TABS Take 0.5 tablets (12.5 mg total) by mouth daily.  15 tablet  1    Assessment: [name] is a 72 yo man admitted with DOE and known CAD to start heparin per pharmacy for ACS/rule out MI.  Heparin level is in goal range.   Goal of Therapy:  Heparin level 0.3-0.7 units/ml Monitor platelets by anticoagulation protocol: Yes   Plan: 1. Cont heparin drip at 900 units/hr 2. 8 hr heparin level to confirm 3. Daily HL and  CBC while on heparin 4.  F/u plans for cath. Talbert Cage , Pharm.D. [phone] 01/13/2013 3:00 AM

## 2013-01-13 NOTE — Progress Notes (Signed)
Subjective:  No chest pain. Awaiting cardiac cath today. CABG-2008. Cath 2012.  Objective:  Vital Signs in the last 24 hours: Temp:  [97 F (36.1 C)-98.3 F (36.8 C)] 97.4 F (36.3 C) (03/26 1218) Pulse Rate:  [69-90] 78 (03/26 1218) Cardiac Rhythm:  [-] Atrial fibrillation (03/26 0735) Resp:  [18-20] 20 (03/26 0417) BP: (92-116)/(57-80) 96/57 mmHg (03/26 1218) SpO2:  [94 %-100 %] 100 % (03/26 1218) Weight:  [64.4 kg (141 lb 15.6 oz)-64.864 kg (143 lb)] 64.864 kg (143 lb) (03/26 4098)  Physical Exam: BP Readings from Last 1 Encounters:  01/13/13 96/57     Wt Readings from Last 1 Encounters:  01/13/13 64.864 kg (143 lb)    Weight change:   HEENT: [place]/AT, Eyes-Blue, PERL, EOMI, Conjunctiva-Pink, Sclera-Non-icteric Neck: + JVD, No bruit, Trachea midline. Lungs:  Clear, Bilateral. Cardiac:  Regular rhythm, normal S1 and S2, no S3.  Abdomen:  Soft, non-tender but distended.. Extremities:  No edema present. No cyanosis. No clubbing. CNS: AxOx3, Cranial nerves grossly intact, moves all 4 extremities. Right handed. Skin: Warm and dry.   Intake/Output from previous day: 03/25 0701 - 03/26 0700 In: 3 [I.V.:3] Out: 100 [Urine:100]    Lab Results: BMET    Component Value Date/Time   NA 129* 01/13/2013 0130   K 3.5 01/13/2013 0130   CL 89* 01/13/2013 0130   CO2 28 01/13/2013 0130   GLUCOSE 184* 01/13/2013 0130   BUN 67* 01/13/2013 0130   CREATININE 1.51* 01/13/2013 0130   CALCIUM 8.8 01/13/2013 0130   GFRNONAA 44* 01/13/2013 0130   GFRAA 51* 01/13/2013 0130   CBC    Component Value Date/Time   WBC 8.0 01/13/2013 0130   RBC 3.99* 01/13/2013 0130   HGB 12.4* 01/13/2013 0130   HCT 35.4* 01/13/2013 0130   PLT 179 01/13/2013 0130   MCV 88.7 01/13/2013 0130   MCH 31.1 01/13/2013 0130   MCHC 35.0 01/13/2013 0130   RDW 14.9 01/13/2013 0130   LYMPHSABS 1.0 01/12/2013 1523   MONOABS 0.9 01/12/2013 1523   EOSABS 0.2 01/12/2013 1523   BASOSABS 0.0 01/12/2013 1523   CARDIAC ENZYMES Lab Results   Component Value Date   CKTOTAL 72 11/01/2011   CKMB 3.0 11/01/2011   TROPONINI <0.30 01/13/2013    Scheduled Meds: . aspirin EC  81 mg Oral Daily  . carvedilol  3.125 mg Oral BID WC  . furosemide  40 mg Oral BID  . glipiZIDE  5 mg Oral Q breakfast  . insulin aspart  0-9 Units Subcutaneous TID WC  . metolazone  2.5 mg Oral QODAY  . potassium chloride SA  20 mEq Oral BID  . sodium chloride  3 mL Intravenous Q12H  . sodium chloride  3 mL Intravenous Q12H  . spironolactone  12.5 mg Oral Daily   Continuous Infusions: . heparin 900 Units/hr (01/12/13 1655)   PRN Meds:.sodium chloride, sodium chloride, acetaminophen, ALPRAZolam, nitroGLYCERIN, ondansetron (ZOFRAN) IV, sodium chloride, sodium chloride  Assessment/Plan: CAD  CABG  Ascites  Chronic left heart systolic failure.  Hypokalemia  DM, II  Dilated cardiomyopathy  R + L heart cath.   LOS: 1 day    Orpah Cobb  MD  01/13/2013, 1:15 PM

## 2013-01-13 NOTE — CV Procedure (Signed)
PROCEDURE:  Right and left heart catheterization with selective coronary angiography, and bypass graft study and cardiac output study. Marland Kitchen CLINICAL HISTORY:  This is a 72 years old male with CAD, CABG, Congestive heart failure, Ascites and DM, II has exertional dyspnea.  The risks, benefits, and details of the procedure were explained to the patient.  The patient verbalized understanding and wanted to proceed.  Informed written consent was obtained.  PROCEDURE TECHNIQUE:  The patient was approached from the right femoral artery using a 5 French short sheath and right femoral vein using a 7 [place] short sheath.  Left coronary angiography was done using a Judkins L4 guide catheter.  Right coronary angiography was done using a Judkins R4 guide catheter.  Bypass graft study was done using right bypass graft catheter. Left ventriculography was done using a pigtail catheter. Right heart pressures were measured and cardiac output study was done using Theone Murdoch catheter and fluoroscopic guidance.   CONTRAST:  Total of 115 cc.  COMPLICATIONS:  None.  At the end of the procedure a manual device was used for hemostasis.    HEMODYNAMICS:  Aortic pressure was 96/57; LV pressure was 98/14; LVEDP 18.  There was no gradient between the left ventricle and aorta.  PA systolic of 55 mm Hg, PCWP was 21, RA pressure was 15. Cardiac output was 2.4L/min by thermodilution technique.  ANGIOGRAM/CORONARY ARTERIOGRAM:   The left main coronary artery has distal concentric narrowing of 70-80 % followed by total occlusion of LAD.  The left anterior descending artery has mild calcification proxial 1/3 vessel total occlusion and severe narrowing of entire 2/3 vessel post LIMA to LAD insertion. Faint filling of diagonal vessels with competitive flow.  The left circumflex artery has proximal 30 % stenosis. Its OM branches are hardly visible. Narrow collateral vessels to diagonal and posterior descending are seen.  The right  coronary artery is dominant but has severe disease of proximal half of the vessel followed by total occlusion.  SVG to diagonal vessel was patent and without apparent disease but diagonal vessel had severe diffuse narrowing.  SVG to PDA was patent without significant disease and PDA and posterolateral arteries had mild diffuse disease. Collateral vessel to septal perforators and OM branch were seen.  LIMA to LAD was patent with slow flow and severe diffuse narrowing of LAD.  LEFT VENTRICULOGRAM:  Left ventricular angiogram was not done. Diffuse hypokinesia of left ventricle and moderate systolic dysfunction with an estimated ejection fraction of 25-30 % by prior echocardiogram.  LVEDP was 18 mmHg.  IMPRESSION OF HEART CATHETERIZATION:   1. Severe left main coronary artery disease. 2. Diffuse narrowing of left anterior descending artery and its branches. 3. Mild left circumflex artery and severe disease of its branches. 4. Severe right coronary artery disease. 5. Patent SVG to Diagonal, PDA and LIMA to LAD.  6. Moderate left ventricular systolic dysfunction.  LVEDP 18 mmHg.  Ejection fraction 25-30 %.  RECOMMENDATION:   Cath findings were discussed with patient and family. In light of patent all three bypass grafts for 13 years and severe native vessel, multivessel disease patient will be treated medically.

## 2013-01-14 ENCOUNTER — Inpatient Hospital Stay (HOSPITAL_COMMUNITY): Payer: Medicare HMO

## 2013-01-14 LAB — BASIC METABOLIC PANEL
BUN: 63 mg/dL — ABNORMAL HIGH (ref 6–23)
Calcium: 8.8 mg/dL (ref 8.4–10.5)
Creatinine, Ser: 1.36 mg/dL — ABNORMAL HIGH (ref 0.50–1.35)
GFR calc non Af Amer: 50 mL/min — ABNORMAL LOW (ref >90)
Glucose, Bld: 134 mg/dL — ABNORMAL HIGH (ref 70–99)

## 2013-01-14 LAB — GLUCOSE, CAPILLARY
Glucose-Capillary: 131 mg/dL — ABNORMAL HIGH (ref 70–99)
Glucose-Capillary: 192 mg/dL — ABNORMAL HIGH (ref 70–99)

## 2013-01-14 NOTE — Progress Notes (Signed)
Subjective:  Feeling better with 7.3 litre of paracentesis.   Objective:  Vital Signs in the last 24 hours: Temp:  [97.2 F (36.2 C)-97.7 F (36.5 C)] 97.2 F (36.2 C) (03/27 0543) Pulse Rate:  [71-112] 112 (03/27 0543) Cardiac Rhythm:  [-] Atrial fibrillation (03/27 0823) BP: (103-114)/(60-74) 110/60 mmHg (03/27 1607) SpO2:  [97 %-100 %] 97 % (03/27 0543) Weight:  [64.4 kg (141 lb 15.6 oz)] 64.4 kg (141 lb 15.6 oz) (03/27 0543)  Physical Exam: BP Readings from Last 1 Encounters:  01/14/13 110/60     Wt Readings from Last 1 Encounters:  01/14/13 64.4 kg (141 lb 15.6 oz)    Weight change: 0 kg (0 lb)  HEENT: [place]/AT, Eyes-Blue, PERL, EOMI, Conjunctiva-Pink, Sclera-Non-icteric Neck: No JVD, No bruit, Trachea midline. Lungs:  Clear, Bilateral. Cardiac:  Regular rhythm, normal S1 and S2, no S3.  Abdomen:  Soft, non-tender. Extremities:  No edema present. No cyanosis. No clubbing. CNS: AxOx3, Cranial nerves grossly intact, moves all 4 extremities. Right handed. Skin: Warm and dry.   Intake/Output from previous day: 03/26 0701 - 03/27 0700 In: 3 [I.V.:3] Out: 900 [Urine:900]    Lab Results: BMET    Component Value Date/Time   NA 130* 01/14/2013 0600   K 3.5 01/14/2013 0600   CL 91* 01/14/2013 0600   CO2 26 01/14/2013 0600   GLUCOSE 134* 01/14/2013 0600   BUN 63* 01/14/2013 0600   CREATININE 1.36* 01/14/2013 0600   CALCIUM 8.8 01/14/2013 0600   GFRNONAA 50* 01/14/2013 0600   GFRAA 58* 01/14/2013 0600   CBC    Component Value Date/Time   WBC 4.2 01/13/2013 1719   RBC 3.94* 01/13/2013 1719   HGB 12.1* 01/13/2013 1719   HCT 35.2* 01/13/2013 1719   PLT 152 01/13/2013 1719   MCV 89.3 01/13/2013 1719   MCH 30.7 01/13/2013 1719   MCHC 34.4 01/13/2013 1719   RDW 15.2 01/13/2013 1719   LYMPHSABS 1.0 01/12/2013 1523   MONOABS 0.9 01/12/2013 1523   EOSABS 0.2 01/12/2013 1523   BASOSABS 0.0 01/12/2013 1523   CARDIAC ENZYMES Lab Results  Component Value Date   CKTOTAL 72 11/01/2011   CKMB 3.0 11/01/2011   TROPONINI <0.30 01/13/2013    Scheduled Meds: . aspirin EC  81 mg Oral Daily  . carvedilol  3.125 mg Oral BID WC  . furosemide  40 mg Oral BID  . glipiZIDE  5 mg Oral Q breakfast  . heparin  5,000 Units Subcutaneous Q8H  . insulin aspart  0-9 Units Subcutaneous TID WC  . metolazone  2.5 mg Oral QODAY  . potassium chloride SA  20 mEq Oral BID  . sodium chloride  3 mL Intravenous Q12H  . spironolactone  12.5 mg Oral Daily   Continuous Infusions:  PRN Meds:.sodium chloride, acetaminophen, ALPRAZolam, nitroGLYCERIN, ondansetron (ZOFRAN) IV, sodium chloride  Assessment/Plan: CAD, native vessel, multivessel CABG x 2 Ascites  S/P Paracentesis Chronic left heart systolic failure.  Hypokalemia  DM, II  Dilated cardiomyopathy  Continue medications.   LOS: 2 days    Orpah Cobb  MD  01/14/2013, 6:08 PM

## 2013-01-14 NOTE — Progress Notes (Signed)
Pharmacist Heart Failure Core Measure Documentation  Assessment: [name] has an EF documented as 25-30 % on 01/13/2013 by cardiac cath.   Rationale: Heart failure patients with left ventricular systolic dysfunction (LVSD) and an EF < 40% should be prescribed an angiotensin converting enzyme inhibitor (ACEI) or angiotensin receptor blocker (ARB) at discharge unless a contraindication is documented in the medical record.  This patient is not currently on an ACEI or ARB for HF.  This note is being placed in the record in order to provide documentation that a contraindication to the use of these agents is present for this encounter.  ACE Inhibitor or Angiotensin Receptor Blocker is contraindicated (specify all that apply)  []   ACEI allergy AND ARB allergy []   Angioedema []   Moderate or severe aortic stenosis []   Hyperkalemia []   Hypotension []   Renal artery stenosis [x]   Worsening renal function, preexisting renal disease or dysfunction   Len Childs T 01/14/2013 2:59 PM

## 2013-01-14 NOTE — Procedures (Signed)
Successful US guided paracentesis from RLQ.  Yielded 7.3L of dark yellow fluid.  No immediate complications.  Pt tolerated well.   Specimen was not sent for labs.  Brayton El PA-C 01/14/2013 4:15 PM

## 2013-01-15 LAB — GLUCOSE, CAPILLARY
Glucose-Capillary: 183 mg/dL — ABNORMAL HIGH (ref 70–99)
Glucose-Capillary: 166 mg/dL — ABNORMAL HIGH (ref 70–99)

## 2013-01-15 LAB — BASIC METABOLIC PANEL
BUN: 70 mg/dL — ABNORMAL HIGH (ref 6–23)
CO2: 25 mEq/L (ref 19–32)
Calcium: 8.7 mg/dL (ref 8.4–10.5)
Chloride: 92 mEq/L — ABNORMAL LOW (ref 96–112)
Creatinine, Ser: 1.56 mg/dL — ABNORMAL HIGH (ref 0.50–1.35)

## 2013-01-15 LAB — CBC
HCT: 36.4 % — ABNORMAL LOW (ref 39.0–52.0)
MCH: 30.7 pg (ref 26.0–34.0)
MCV: 85.8 fL (ref 78.0–100.0)
RBC: 4.24 MIL/uL (ref 4.22–5.81)
RDW: 15.4 % (ref 11.5–15.5)
WBC: 7.4 10*3/uL (ref 4.0–10.5)

## 2013-01-15 MED ORDER — SODIUM CHLORIDE 0.9 % IV SOLN
INTRAVENOUS | Status: DC
  Administered 2013-01-15: 11:00:00 via INTRAVENOUS

## 2013-01-15 MED ORDER — METOLAZONE 2.5 MG PO TABS
2.5000 mg | ORAL_TABLET | ORAL | Status: DC
  Administered 2013-01-15: 2.5 mg via ORAL
  Filled 2013-01-15: qty 1

## 2013-01-15 MED ORDER — POTASSIUM CHLORIDE CRYS ER 20 MEQ PO TBCR: 20.0000 meq | EXTENDED_RELEASE_TABLET | Freq: Every day | ORAL | Status: DC

## 2013-01-15 NOTE — Care Management Note (Unsigned)
    Page 1 of 1   01/15/2013     3:10:50 PM   CARE MANAGEMENT NOTE 01/15/2013  Patient:  [name], [name]   Account Number:  [id number]  Date Initiated:  01/15/2013  Documentation initiated by:  Chiyeko Ferre  Subjective/Objective Assessment:   PT ADM WITH ASCITES S/P PARACENTESIS 01/14/13.  PTA, PT LIVES AT HOME WITH SPOUSE.     Action/Plan:   PT ACTIVE WITH [facility] FOR HHRN FOR CHF FOLLOW UP. [facility] TO FOLLOW UP AS PRIOR TO ADMISSION.  WILL NEED RESUMPTION ORDERS FOR HH CARE.   Anticipated DC Date:  01/15/2013   Anticipated DC Plan:  HOME W HOME HEALTH SERVICES      DC Planning Services  CM consult      [facility] Choice  HOME HEALTH  Resumption Of Svcs/PTA Provider   Choice offered to / List presented to:  C-1 Patient        HH arranged  HH-1 RN  HH-10 DISEASE MANAGEMENT      HH agency  [facility]   Status of service:  In process, will continue to follow Medicare Important Message given?   (If response is "NO", the following Medicare IM given date fields will be blank) Date Medicare IM given:   Date Additional Medicare IM given:    Discharge Disposition:    Per UR Regulation:  Reviewed for med. necessity/level of care/duration of stay  If discussed at Long Length of Stay Meetings, dates discussed:    Comments:

## 2013-01-15 NOTE — Progress Notes (Signed)
Discharged to home with family office visits in place teaching done  

## 2013-01-15 NOTE — Discharge Summary (Signed)
Physician Discharge Summary  Patient ID: BLUE WINTHER MRN: [id number] DOB/AGE: [birth year] 72 y.o.  Admit date: 01/12/2013 Discharge date: 01/15/2013  Admission Diagnoses: CAD, native vessel, multivessel  CABG x 2  Ascites  S/P Paracentesis  Chronic left heart systolic failure.  Hypokalemia  DM, II  Dilated cardiomyopathy  Discharge Diagnoses:  Principle Problems: * CAD, native vessel, multivessel * CABG x 2  Ascites  S/P Paracentesis  Chronic left heart systolic failure.  Hypokalemia  DM, II  Dilated cardiomyopathy  Discharged Condition: good  Hospital Course: 72 years old male with known CAD, CABG, DM, II and CHF with ascites has exertional dyspnea. He underwent right and left heart catheterization whish showed Patent bypass grafts ( LIMA to LAD, SVG to diagonal and SVG to PDA). Patient also has 7.3 litre ascitic fluid removal. He was discharged home in stable condition.   Consults: None  Significant Diagnostic Studies: labs: Near normal CBC, Mild hyponatremia, elevated sugar, BUN/CR at 70/1.56.  R + L Heart cath showed : 1. Severe left main coronary artery disease. 2. Diffuse narrowing of left anterior descending artery and its branches. 3. Mild left circumflex artery and severe disease of its branches. 4. Severe right coronary artery disease. 5. Patent SVG to Diagonal, PDA and LIMA to LAD.  6. Moderate left ventricular systolic dysfunction. LVEDP 18 mmHg. Ejection fraction 25-30 %.   Treatments: cardiac meds: carvedilol and Spironolactone, insulin: Humalog and procedures: paracentesis and right and left heart catheterization.  Discharge Exam: Blood pressure 97/61, pulse 77, temperature 98.1 F (36.7 C), temperature source Oral, resp. rate 19, height 5\' 5"  (1.651 m), weight 58.9 kg (129 lb 13.6 oz), SpO2 99.00%. HEENT: [place]/AT, Eyes-Blue, PERL, EOMI, Conjunctiva-Pink, Sclera-Non-icteric  Neck: No JVD, No bruit, Trachea midline.  Lungs: Clear, Bilateral.   Cardiac: Regular rhythm, normal S1 and S2, no S3.  Abdomen: Soft, non-tender.  Extremities: No edema present. No cyanosis. No clubbing.  CNS: AxOx3, Cranial nerves grossly intact, moves all 4 extremities. Right handed.  Skin: Warm and dry.   Disposition: 01-Home or Self Care     Medication List    STOP taking these medications       metFORMIN 500 MG tablet  Commonly known as:  GLUCOPHAGE      TAKE these medications       carvedilol 3.125 MG tablet  Commonly known as:  COREG  Take 1 tablet (3.125 mg total) by mouth 2 (two) times daily with a meal.     furosemide 40 MG tablet  Commonly known as:  LASIX  Take 1 tablet (40 mg total) by mouth 2 (two) times daily.     glipiZIDE 5 MG 24 hr tablet  Commonly known as:  GLUCOTROL XL  Take 5 mg by mouth daily.     HUMULIN 70/30 (70-30) 100 UNIT/ML injection  Generic drug:  insulin NPH-insulin regular  Inject 5-10 Units into the skin daily as needed (Per Sliding Scale).     metolazone 2.5 MG tablet  Commonly known as:  ZAROXOLYN  Take 1 tablet (2.5 mg total) by mouth every other day.     miconazole 2 % powder  Commonly known as:  MICOTIN  Apply 1 application topically as needed for itching (to back of knee).     potassium chloride SA 20 MEQ tablet  Commonly known as:  K-DUR,KLOR-CON  Take 1 tablet (20 mEq total) by mouth daily.     spironolactone 12.5 mg Tabs  Commonly known as:  ALDACTONE  Take 0.5  tablets (12.5 mg total) by mouth daily.           Follow-up Information   Follow up with [facility] S, MD. Schedule an appointment as soon as possible for a visit in 1 week.   Contact information:   [address] [place] [address] [phone]       Signed: [name] 01/15/2013, 5:08 PM

## 2013-04-12 ENCOUNTER — Encounter (HOSPITAL_COMMUNITY): Payer: Self-pay | Admitting: General Practice

## 2013-04-12 ENCOUNTER — Observation Stay (HOSPITAL_COMMUNITY)
Admission: AD | Admit: 2013-04-12 | Discharge: 2013-04-14 | Disposition: A | Discharge status: Home / Self Care | Payer: Medicare HMO | Source: Ambulatory Visit | Attending: Cardiovascular Disease | Admitting: Cardiovascular Disease

## 2013-04-12 ENCOUNTER — Other Ambulatory Visit: Payer: Self-pay

## 2013-04-12 DIAGNOSIS — E119 Type 2 diabetes mellitus without complications: Secondary | ICD-10-CM | POA: Insufficient documentation

## 2013-04-12 DIAGNOSIS — I5022 Chronic systolic (congestive) heart failure: Secondary | ICD-10-CM | POA: Insufficient documentation

## 2013-04-12 DIAGNOSIS — I428 Other cardiomyopathies: Secondary | ICD-10-CM | POA: Insufficient documentation

## 2013-04-12 DIAGNOSIS — I2510 Atherosclerotic heart disease of native coronary artery without angina pectoris: Secondary | ICD-10-CM | POA: Insufficient documentation

## 2013-04-12 DIAGNOSIS — E876 Hypokalemia: Secondary | ICD-10-CM | POA: Insufficient documentation

## 2013-04-12 DIAGNOSIS — R9431 Abnormal electrocardiogram [ECG] [EKG]: Secondary | ICD-10-CM | POA: Insufficient documentation

## 2013-04-12 DIAGNOSIS — R188 Other ascites: Principal | ICD-10-CM | POA: Insufficient documentation

## 2013-04-12 HISTORY — DX: Type 2 diabetes mellitus without complications: E11.9

## 2013-04-12 HISTORY — DX: Cardiac murmur, unspecified: R01.1

## 2013-04-12 HISTORY — DX: Unspecified chronic bronchitis: J42

## 2013-04-12 LAB — BASIC METABOLIC PANEL
CO2: 23 mEq/L (ref 19–32)
Chloride: 92 mEq/L — ABNORMAL LOW (ref 96–112)
Glucose, Bld: 148 mg/dL — ABNORMAL HIGH (ref 70–99)
Potassium: 4.6 mEq/L (ref 3.5–5.1)
Sodium: 126 mEq/L — ABNORMAL LOW (ref 135–145)

## 2013-04-12 LAB — MRSA PCR SCREENING: MRSA by PCR: NEGATIVE

## 2013-04-12 LAB — CBC
HCT: 35.9 % — ABNORMAL LOW (ref 39.0–52.0)
MCV: 87.8 fL (ref 78.0–100.0)
Platelets: 225 10*3/uL (ref 150–400)
RBC: 4.09 MIL/uL — ABNORMAL LOW (ref 4.22–5.81)
RDW: 15.1 % (ref 11.5–15.5)
WBC: 7.5 10*3/uL (ref 4.0–10.5)

## 2013-04-12 LAB — GLUCOSE, CAPILLARY: Glucose-Capillary: 131 mg/dL — ABNORMAL HIGH (ref 70–99)

## 2013-04-12 MED ORDER — HEPARIN SODIUM (PORCINE) 5000 UNIT/ML IJ SOLN
5000.0000 [IU] | Freq: Three times a day (TID) | INTRAMUSCULAR | Status: DC
  Administered 2013-04-12 – 2013-04-13 (×3): 5000 [IU] via SUBCUTANEOUS
  Filled 2013-04-12 (×8): qty 1

## 2013-04-12 MED ORDER — SODIUM CHLORIDE 0.9 % IV SOLN: 250.0000 mL | INTRAVENOUS | Status: DC | PRN

## 2013-04-12 MED ORDER — SODIUM CHLORIDE 0.9 % IJ SOLN: 3.0000 mL | Freq: Two times a day (BID) | INTRAMUSCULAR | Status: DC

## 2013-04-12 MED ORDER — CARVEDILOL 3.125 MG PO TABS
3.1250 mg | ORAL_TABLET | Freq: Two times a day (BID) | ORAL | Status: DC
  Administered 2013-04-12 – 2013-04-14 (×2): 3.125 mg via ORAL
  Filled 2013-04-12 (×6): qty 1

## 2013-04-12 MED ORDER — POTASSIUM CHLORIDE CRYS ER 20 MEQ PO TBCR
20.0000 meq | EXTENDED_RELEASE_TABLET | Freq: Two times a day (BID) | ORAL | Status: DC
  Administered 2013-04-12 – 2013-04-14 (×4): 20 meq via ORAL
  Filled 2013-04-12 (×6): qty 1

## 2013-04-12 MED ORDER — SODIUM CHLORIDE 0.9 % IJ SOLN: 3.0000 mL | INTRAMUSCULAR | Status: DC | PRN

## 2013-04-12 MED ORDER — METOLAZONE 2.5 MG PO TABS
2.5000 mg | ORAL_TABLET | ORAL | Status: DC
  Administered 2013-04-12 – 2013-04-14 (×2): 2.5 mg via ORAL
  Filled 2013-04-12 (×2): qty 1

## 2013-04-12 MED ORDER — SODIUM CHLORIDE 0.9 % IJ SOLN
3.0000 mL | Freq: Two times a day (BID) | INTRAMUSCULAR | Status: DC
  Administered 2013-04-12 – 2013-04-14 (×4): 3 mL via INTRAVENOUS

## 2013-04-12 MED ORDER — FUROSEMIDE 40 MG PO TABS
40.0000 mg | ORAL_TABLET | Freq: Two times a day (BID) | ORAL | Status: DC
  Administered 2013-04-12 – 2013-04-14 (×3): 40 mg via ORAL
  Filled 2013-04-12 (×6): qty 1

## 2013-04-12 MED ORDER — GLIPIZIDE ER 5 MG PO TB24
5.0000 mg | ORAL_TABLET | Freq: Every day | ORAL | Status: DC
  Administered 2013-04-13 – 2013-04-14 (×2): 5 mg via ORAL
  Filled 2013-04-12 (×3): qty 1

## 2013-04-12 MED ORDER — SPIRONOLACTONE 12.5 MG HALF TABLET
12.5000 mg | ORAL_TABLET | Freq: Every day | ORAL | Status: DC
  Administered 2013-04-12 – 2013-04-14 (×3): 12.5 mg via ORAL
  Filled 2013-04-12 (×3): qty 1

## 2013-04-12 MED ORDER — INSULIN ASPART 100 UNIT/ML ~~LOC~~ SOLN
0.0000 [IU] | Freq: Three times a day (TID) | SUBCUTANEOUS | Status: DC
  Administered 2013-04-13: 1 [IU] via SUBCUTANEOUS
  Administered 2013-04-13: 2 [IU] via SUBCUTANEOUS
  Administered 2013-04-14: 3 [IU] via SUBCUTANEOUS

## 2013-04-12 NOTE — H&P (Signed)
[name] is an 72 y.o. male.   Chief Complaint: Abdominal distension HPI: 72 years old male with known CAD, CABG, DM, II and CHF has ascites and weight gain without dyspnea. No fever or cough.   Past Medical History  Diagnosis Date  . Shortness of breath   . Coronary artery disease   . Hypertension   . CHF (congestive heart failure)   . COPD (chronic obstructive pulmonary disease)   . High cholesterol   . Angina   . Pulmonary embolism 1980's  . Bronchitis   . Pneumonia   . 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 Gout   . Chronic kidney disease     renal insufficiency  . Diabetes mellitus     type 2      Past Surgical History  Procedure Laterality Date  . Retinal laser procedure      "both eyes; more times on the left"  . Cataract extraction w/ intraocular lens  implant, bilateral  2005  . Appendectomy  ~ 1962  . Cardiac catheterization    . Cervical discectomy  1984    C6-7  . 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 Social History:  reports that he quit smoking about 39 years ago. His smoking use included Cigarettes. He has a 5 pack-year smoking history. He has quit using smokeless tobacco. His smokeless tobacco use included Chew. He reports that he drinks about 1.0 ounces of alcohol per week. He reports that he does not use illicit drugs.  Allergies:  Allergies  Allergen Reactions  . Sulfa Antibiotics Hives    Medications Prior to Admission  Medication Sig Dispense Refill  . carvedilol (COREG) 3.125 MG tablet Take 1 tablet (3.125 mg total) by mouth 2 (two) times daily with a meal.      . furosemide (LASIX) 40 MG tablet Take 1 tablet (40 mg total) by mouth 2 (two) times daily.  30 tablet  1  . glipiZIDE (GLUCOTROL XL) 5 MG 24 hr tablet Take 5 mg by mouth daily.      . insulin NPH-insulin regular (HUMULIN 70/30) (70-30) 100 UNIT/ML injection Inject 5-10 Units into the skin daily as needed (Per  Sliding Scale).      . metolazone (ZAROXOLYN) 2.5 MG tablet Take 1 tablet (2.5 mg total) by mouth every other day.  15 tablet  1  . miconazole (MICOTIN) 2 % powder Apply 1 application topically as needed for itching (to back of knee).      . potassium chloride SA (K-DUR,KLOR-CON) 20 MEQ tablet Take 1 tablet (20 mEq total) by mouth daily.      Marland Kitchen spironolactone (ALDACTONE) 12.5 mg TABS Take 0.5 tablets (12.5 mg total) by mouth daily.  15 tablet  1    No results found for this or any previous visit (from the past 48 hour(s)). No results found.  @ROS @ Constitutional: Positive for malaise/fatigue. Negative for fever, chills, weight loss and diaphoresis.  HENT: Negative for hearing loss, nosebleeds, sore throat and ear discharge.  Eyes: Negative for blurred vision, pain and discharge.  Respiratory: Positive for shortness of breath. Negative for cough and sputum production.  Cardiovascular: Positive for orthopnea. Negative for chest pain and leg swelling.  Gastrointestinal: Positive for abdominal pain. Negative for heartburn, nausea and constipation. + ascites.  Genitourinary: Negative for dysuria and urgency.  Musculoskeletal: Positive for  joint pain. Negative for myalgias and back pain.  Skin: Negative for rash.  Neurological: Positive for weakness. Negative for dizziness, tremors, focal weakness, seizures and headaches.  Psychiatric/Behavioral: Positive for depression. Negative for suicidal ideas, memory loss and substance abuse. The patient is not nervous/anxious and does not have insomnia  There were no vitals taken for this visit.  Constitutional: He appears averagely-developed and poorly nourished.  HENT: Head: Normocephalic and atraumatic. Mouth/Throat: No oropharyngeal exudate.  Eyes: Conjunctivae are normal. Pupils are equal, round, and reactive to light. No scleral icterus.  Neck: Normal range of motion. JVD present. No tracheal deviation present. No thyromegaly present.   Cardiovascular: Normal rate and regular rhythm. III/VI systolic murmur heard.  Respiratory: He is in no respiratory distress. He has no rales.  GI: Soft. He exhibits marked distension. There is no tenderness. There is no rebound.  Musculoskeletal: He exhibits no edema and no tenderness.  Lymphadenopathy: He has no cervical adenopathy.  Neurological: He is alert and oriented to person, place, and time. He displays normal reflexes. No cranial nerve deficit.  Skin: Skin is warm and dry.  Psychiatric: He has a normal mood and affect.  Assessment/Plan Ascites  Chronic left heart systolic failure.  CAD CABG Hypokalemia  DM, II  Dilated cardiomyopathy  Place in observation/Potassium supplementation. Paracentesis in AM.   Kataleena Holsapple S 04/12/2013, 5:41 PM

## 2013-04-12 NOTE — Progress Notes (Signed)
Pt admitted to unit 6700 from home.  Pt is alert and oriented, in no acute distress, wife is at bedside. Patient made familiar with staff, call bell, and room. Bed in lowest position. Call bell within reach. Full assessment to Epic. Will continue to monitor. Fayne Norrie, RN

## 2013-04-13 ENCOUNTER — Observation Stay (HOSPITAL_COMMUNITY): Payer: Medicare HMO

## 2013-04-13 LAB — GLUCOSE, CAPILLARY
Glucose-Capillary: 139 mg/dL — ABNORMAL HIGH (ref 70–99)
Glucose-Capillary: 190 mg/dL — ABNORMAL HIGH (ref 70–99)

## 2013-04-13 LAB — HEMOGLOBIN A1C
Hgb A1c MFr Bld: 7.6 % — ABNORMAL HIGH (ref <5.7)
Mean Plasma Glucose: 171 mg/dL — ABNORMAL HIGH (ref <117)

## 2013-04-13 LAB — BASIC METABOLIC PANEL
CO2: 25 mEq/L (ref 19–32)
Chloride: 94 mEq/L — ABNORMAL LOW (ref 96–112)
GFR calc Af Amer: 64 mL/min — ABNORMAL LOW (ref >90)
Potassium: 3.7 mEq/L (ref 3.5–5.1)

## 2013-04-13 LAB — CBC
HCT: 33.6 % — ABNORMAL LOW (ref 39.0–52.0)
Hemoglobin: 11.5 g/dL — ABNORMAL LOW (ref 13.0–17.0)
MCV: 87.5 fL (ref 78.0–100.0)
WBC: 6.1 10*3/uL (ref 4.0–10.5)

## 2013-04-13 LAB — MAGNESIUM: Magnesium: 2.2 mg/dL (ref 1.5–2.5)

## 2013-04-13 LAB — PROTIME-INR: INR: 1.13 (ref 0.00–1.49)

## 2013-04-13 NOTE — Progress Notes (Signed)
Subjective:  Had 6.2 L fluid removed by paracentesis. No dizziness.  Objective:  Vital Signs in the last 24 hours: Temp:  [97.5 F (36.4 C)-98.3 F (36.8 C)] 97.5 F (36.4 C) (06/24 1539) Pulse Rate:  [67-90] 67 (06/24 1539) Cardiac Rhythm:  [-] Atrial fibrillation (06/24 0750) Resp:  [16-20] 20 (06/24 1539) BP: (88-105)/(48-66) 98/58 mmHg (06/24 1539) SpO2:  [96 %-100 %] 98 % (06/24 1539) Weight:  [64.8 kg (142 lb 13.7 oz)-66.1 kg (145 lb 11.6 oz)] 64.8 kg (142 lb 13.7 oz) (06/24 0730)  Physical Exam: BP Readings from Last 1 Encounters:  04/13/13 98/58     Wt Readings from Last 1 Encounters:  04/13/13 64.8 kg (142 lb 13.7 oz)    Weight change:   HEENT: [place]/AT, Eyes-Blue, PERL, EOMI, Conjunctiva-Pale pink, Sclera-Non-icteric Neck: No JVD, No bruit, Trachea midline. Lungs:  Clear, Bilateral. Cardiac:  Regular rhythm, normal S1 and S2, no S3.  Abdomen:  Soft, non-tender. Extremities:  No edema present. No cyanosis. No clubbing. CNS: AxOx3, Cranial nerves grossly intact, moves all 4 extremities. Right handed. Skin: Warm and dry.   Intake/Output from previous day: 06/23 0701 - 06/24 0700 In: 240 [P.O.:240] Out: 750 [Urine:750]    Lab Results: BMET    Component Value Date/Time   NA 129* 04/13/2013 0546   K 3.7 04/13/2013 0546   CL 94* 04/13/2013 0546   CO2 25 04/13/2013 0546   GLUCOSE 157* 04/13/2013 0546   BUN 50* 04/13/2013 0546   CREATININE 1.25 04/13/2013 0546   CALCIUM 8.2* 04/13/2013 0546   GFRNONAA 55* 04/13/2013 0546   GFRAA 64* 04/13/2013 0546   CBC    Component Value Date/Time   WBC 6.1 04/13/2013 0546   RBC 3.84* 04/13/2013 0546   HGB 11.5* 04/13/2013 0546   HCT 33.6* 04/13/2013 0546   PLT 225 04/13/2013 0546   MCV 87.5 04/13/2013 0546   MCH 29.9 04/13/2013 0546   MCHC 34.2 04/13/2013 0546   RDW 14.9 04/13/2013 0546   LYMPHSABS 1.0 01/12/2013 1523   MONOABS 0.9 01/12/2013 1523   EOSABS 0.2 01/12/2013 1523   BASOSABS 0.0 01/12/2013 1523   CARDIAC ENZYMES Lab  Results  Component Value Date   CKTOTAL 72 11/01/2011   CKMB 3.0 11/01/2011   TROPONINI <0.30 01/13/2013    Scheduled Meds: . carvedilol  3.125 mg Oral BID WC  . furosemide  40 mg Oral BID  . glipiZIDE  5 mg Oral Q breakfast  . heparin  5,000 Units Subcutaneous Q8H  . insulin aspart  0-9 Units Subcutaneous TID WC  . metolazone  2.5 mg Oral QODAY  . potassium chloride SA  20 mEq Oral BID  . sodium chloride  3 mL Intravenous Q12H  . sodium chloride  3 mL Intravenous Q12H  . spironolactone  12.5 mg Oral Daily   Continuous Infusions:  PRN Meds:.sodium chloride, sodium chloride  Assessment/Plan: Ascites  Chronic left heart systolic failure.  CAD  CABG  Hypokalemia  DM, II  Dilated cardiomyopathy  Continue monitoring, if stable home in AM.    LOS: 1 day    Orpah Cobb  MD  04/13/2013, 5:59 PM

## 2013-04-13 NOTE — Procedures (Signed)
US guided para  RLQ 6.2 L dark yellow  Pt tolerated well  BP stable

## 2013-04-13 NOTE — Progress Notes (Signed)
Utilization review completed.  P.J. Buzz Axel,RN,BSN Case Manager [phone]  

## 2013-04-13 NOTE — Progress Notes (Addendum)
Patient had a 4 beat run of VTach. Patient was resting in bed in no acute distress. Patient stated he had 0 pain or any other symptoms. Currently running AFib on the monitor. Dr Algie Coffer will be notified. Patient will continue to be monitored.  Patient had 3 beat vtach and 2 beat vtach at 0913. Patient stated 0 pain or any other symptoms. Currently in Afib with frequent PVCs. Will continue to monitor.

## 2013-04-14 LAB — BASIC METABOLIC PANEL
CO2: 27 mEq/L (ref 19–32)
Glucose, Bld: 148 mg/dL — ABNORMAL HIGH (ref 70–99)
Potassium: 3.2 mEq/L — ABNORMAL LOW (ref 3.5–5.1)
Sodium: 131 mEq/L — ABNORMAL LOW (ref 135–145)

## 2013-04-14 MED ORDER — POTASSIUM CHLORIDE CRYS ER 20 MEQ PO TBCR
20.0000 meq | EXTENDED_RELEASE_TABLET | Freq: Once | ORAL | Status: AC
  Administered 2013-04-14: 20 meq via ORAL

## 2013-04-14 NOTE — Discharge Summary (Signed)
Physician Discharge Summary  Patient ID: [name] MRN: [id number] DOB/AGE: Jul 26, 1941 72 y.o.  Admit date: 04/12/2013 Discharge date: 04/14/2013  Admission Diagnoses: Ascites  Chronic left heart systolic failure.  CAD  CABG  Hypokalemia  DM, II  Dilated cardiomyopathy  Discharge Diagnoses:  Principle Problem: * Ascites * Chronic left heart systolic failure.  CAD  CABG  Hypokalemia  DM, II  Dilated cardiomyopathy Hypokalemia  Discharged Condition: fair  Hospital Course: 72 years old male with known CAD, CABG, DM, II and CHF had ascites and weight gain without dyspnea. He had 6.2 litre fluid removed by paracentesis. He needed potassium supplementation. He was discharged home in fair condition.   Consults: None  Significant Diagnostic Studies: labs: Near normal CBC and electrolytes except low level of potassium post fluid removal.  Treatments: procedures: paracentesis.  Discharge Exam: Blood pressure 92/70, pulse 76, temperature 97.7 F (36.5 C), temperature source Oral, resp. rate 18, height 5\' 5"  (1.651 m), weight 59.8 kg (131 lb 13.4 oz), SpO2 96.00%. HEENT: [place]/AT, Eyes-Blue, PERL, EOMI, Conjunctiva-Pale pink, Sclera-Non-icteric  Neck: No JVD, No bruit, Trachea midline.  Lungs: Clear, Bilateral.  Cardiac: Regular rhythm, normal S1 and S2, no S3.  Abdomen: Soft, non-tender.  Extremities: No edema present. No cyanosis. No clubbing.  CNS: AxOx3, Cranial nerves grossly intact, moves all 4 extremities. Right handed.  Skin: Warm and dry.   Disposition: 01-Home or Self Care     Medication List    TAKE these medications       carvedilol 3.125 MG tablet  Commonly known as:  COREG  Take 1 tablet (3.125 mg total) by mouth 2 (two) times daily with a meal.     fish oil-omega-3 fatty acids 1000 MG capsule  Take 1 g by mouth daily.     furosemide 40 MG tablet  Commonly known as:  LASIX  Take 1 tablet (40 mg total) by mouth 2 (two) times daily.     glipiZIDE 5 MG 24 hr tablet  Commonly known as:  GLUCOTROL XL  Take 5 mg by mouth daily.     HUMULIN 70/30 (70-30) 100 UNIT/ML injection  Generic drug:  insulin NPH-regular  Inject 5-10 Units into the skin daily as needed (Per Sliding Scale).     metolazone 2.5 MG tablet  Commonly known as:  ZAROXOLYN  Take 1 tablet (2.5 mg total) by mouth every other day.     miconazole 2 % powder  Commonly known as:  MICOTIN  Apply 1 application topically as needed for itching (to back of knee).     OVER THE COUNTER MEDICATION  Take 2 tablets by mouth daily. Ultra guard forte.  Multi vitamin in food form     potassium chloride SA 20 MEQ tablet  Commonly known as:  K-DUR,KLOR-CON  Take 1 tablet (20 mEq total) by mouth daily.     spironolactone 12.5 mg Tabs  Commonly known as:  ALDACTONE  Take 0.5 tablets (12.5 mg total) by mouth daily.           Follow-up Information   Follow up with [facility] S, MD. Schedule an appointment as soon as possible for a visit in 1 week.   Contact information:   [address] [place] [address] [phone]       Signed: [name] 04/14/2013, 1:05 PM

## 2013-04-14 NOTE — Progress Notes (Signed)
Discussed discharge instructions and medications with pt. Pt showed no barriers to discharge. IV removed. Tele removed. Pt discharged to home with wife. Assessment unchanged from morning.

## 2013-07-28 ENCOUNTER — Ambulatory Visit (INDEPENDENT_AMBULATORY_CARE_PROVIDER_SITE_OTHER): Payer: Medicare HMO | Admitting: Emergency Medicine

## 2013-07-28 ENCOUNTER — Ambulatory Visit: Payer: Medicare HMO

## 2013-07-28 VITALS — BP 110/62 | HR 79 | Temp 97.9°F | Resp 16 | Wt 134.6 lb

## 2013-07-28 DIAGNOSIS — M25532 Pain in left wrist: Secondary | ICD-10-CM

## 2013-07-28 DIAGNOSIS — M19039 Primary osteoarthritis, unspecified wrist: Secondary | ICD-10-CM

## 2013-07-28 DIAGNOSIS — M19032 Primary osteoarthritis, left wrist: Secondary | ICD-10-CM

## 2013-07-28 MED ORDER — MELOXICAM 15 MG PO TABS: 15.0000 mg | ORAL_TABLET | Freq: Every day | ORAL | Status: DC

## 2013-07-28 NOTE — Progress Notes (Signed)
[facility] [address], [place] [address] [phone]  Date:  07/28/2013   Name:  [name]   DOB:  16-Mar-1941   MRN:  [id number]  PCP:  [name], MD    Chief Complaint: Wrist Pain   History of Present Illness:  [name] is a 72 y.o. very pleasant male patient who presents with the following:  Pain in left wrist for the past month.  Has history of gout, but says this "is not gout".  No history of injury or overuse.  Unable to use left hand assist for standing.  Has swelling and tenderness wrist.  No improvement with over the counter medications or other home remedies. Denies other complaint or health concern today.   Patient Active Problem List   Diagnosis Date Noted  . CHF (congestive heart failure) 10/31/2011    Class: Chronic  . Ascites, other 10/31/2011    Class: Acute  . DM II (diabetes mellitus, type II), controlled 10/31/2011    Class: Chronic  . 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 Shortness of breath   . Coronary artery disease   . Hypertension   . CHF (congestive heart failure)   . COPD (chronic obstructive pulmonary disease)   . High cholesterol   . Pulmonary embolism 1980's or 1990's  . Pneumonia   . 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 Gout   . Chronic kidney disease     renal insufficiency  . Complication of anesthesia 10/16/1995    "went into heart failure during the OR" (04/12/2013)  . Heart murmur   . Chronic bronchitis     "used to get it often; haven't had it for awhile now" (04/12/2013)  . Type II diabetes mellitus   . Polio 1951    "affected the right side of my body; been weak on that side ever since" (04/12/2013)    Past Surgical History  Procedure Laterality Date  . Retinal laser procedure Bilateral     "more than once on the left" (04/12/2013)  . Cataract extraction w/ intraocular lens  implant, bilateral  Bilateral 2005  . Appendectomy  ~ 1962  . Cardiac catheterization      "I've had a few since 1989" (04/12/2013)  . Cervical discectomy  1984    C6-7  . 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 Exploratory laparotomy  ~ 1962    "took out my appendix while there; bowel was inflammed" (04/12/2013)  . Hydrocele excision / repair      "couple times" (04/12/2013)    History  Substance Use Topics  . Smoking status: Former Smoker -- 0.25 packs/day for 20 years    Types: Cigarettes    Quit date: 10/30/1973  . Smokeless tobacco: Former Neurosurgeon    Types: Chew    Quit date: 10/21/1969  . Alcohol Use: 0.0 oz/week     Comment: 04/12/2013 "have ~ 2 drinks/yr; a drink for me is ~ 0.5oz"    History reviewed. No pertinent family history.  Allergies  Allergen Reactions  . Sulfa Antibiotics Hives    Medication list has been reviewed and updated.  Current Outpatient Prescriptions on File Prior to Visit  Medication Sig Dispense Refill  . carvedilol (COREG) 3.125 MG tablet Take 1 tablet (3.125 mg total) by mouth  2 (two) times daily with a meal.      . fish oil-omega-3 fatty acids 1000 MG capsule Take 1 g by mouth daily.      . furosemide (LASIX) 40 MG tablet Take 1 tablet (40 mg total) by mouth 2 (two) times daily.  30 tablet  1  . glipiZIDE (GLUCOTROL XL) 5 MG 24 hr tablet Take 5 mg by mouth daily.      . insulin NPH-insulin regular (HUMULIN 70/30) (70-30) 100 UNIT/ML injection Inject 5-10 Units into the skin daily as needed (Per Sliding Scale).      . metolazone (ZAROXOLYN) 2.5 MG tablet Take 1 tablet (2.5 mg total) by mouth every other day.  15 tablet  1  . miconazole (MICOTIN) 2 % powder Apply 1 application topically as needed for itching (to back of knee).      Marland Kitchen OVER THE COUNTER MEDICATION Take 2 tablets by mouth daily. Ultra guard forte.  Multi vitamin in food form      . potassium chloride SA (K-DUR,KLOR-CON) 20 MEQ tablet Take 1 tablet (20 mEq total) by mouth  daily.      Marland Kitchen spironolactone (ALDACTONE) 12.5 mg TABS Take 0.5 tablets (12.5 mg total) by mouth daily.  15 tablet  1   No current facility-administered medications on file prior to visit.    Review of Systems:  As per HPI, otherwise negative.    Physical Examination: Filed Vitals:   07/28/13 1254  BP: 110/62  Pulse: 79  Temp: 97.9 F (36.6 C)  Resp: 16   Filed Vitals:   07/28/13 1254  Weight: 134 lb 9.6 oz (61.054 kg)   Body mass index is 22.4 kg/(m^2). Ideal Body Weight:     GEN: WDWN, NAD, Non-toxic, Alert & Oriented x 3 HEENT: Atraumatic, Normocephalic.  Ears and Nose: No external deformity. EXTR: No clubbing/cyanosis/edema NEURO: Normal gait.  PSYCH: Normally interactive. Conversant. Not depressed or anxious appearing.  Calm demeanor.  LEFT wrist:  Tender, swollen, ecchymotic.  No deformity.  Guards flexion and extension. Limited AROM  Assessment and Plan: Wrist pain mobic  Signed,  Phillips Odor, MD   [facility] reading (PRIMARY) by  Dr. Dareen Piano.  DJD wrist..

## 2014-04-28 ENCOUNTER — Ambulatory Visit (INDEPENDENT_AMBULATORY_CARE_PROVIDER_SITE_OTHER): Payer: Medicare HMO

## 2014-04-28 ENCOUNTER — Ambulatory Visit (INDEPENDENT_AMBULATORY_CARE_PROVIDER_SITE_OTHER): Payer: Medicare HMO | Admitting: Family Medicine

## 2014-04-28 VITALS — BP 100/52 | HR 84 | Temp 97.7°F | Resp 18

## 2014-04-28 DIAGNOSIS — M79609 Pain in unspecified limb: Secondary | ICD-10-CM

## 2014-04-28 DIAGNOSIS — M25571 Pain in right ankle and joints of right foot: Secondary | ICD-10-CM

## 2014-04-28 DIAGNOSIS — M25561 Pain in right knee: Secondary | ICD-10-CM

## 2014-04-28 DIAGNOSIS — M25569 Pain in unspecified knee: Secondary | ICD-10-CM | POA: Diagnosis not present

## 2014-04-28 DIAGNOSIS — M79671 Pain in right foot: Secondary | ICD-10-CM

## 2014-04-28 DIAGNOSIS — M25579 Pain in unspecified ankle and joints of unspecified foot: Secondary | ICD-10-CM

## 2014-04-28 NOTE — Progress Notes (Signed)
[facility] [address], [place] [id number] [phone]  Date:  04/28/2014   Name:  [name]   DOB:  1940-10-25   MRN:  [id number]  PCP:  Ricki RodriguezKADAKIA,AJAY S, MD    Chief Complaint: Knee Pain and Ankle Pain   History of Present Illness:  [name] is a 73 y.o. very pleasant male patient who presents with the following:  He twisted his right right knee and ankle about 2 weeks ago- he tripped, nearly fell but was able to catch himself  He noted pain right away.  It really has not gotten better since the accident.   He does not normally use a WC but is in a chair here today.  At home he is scooting around in a computer chair.  He has several other health problems, but these seem to be stable at this time.  He feels well except for his leg.   He does not have an active relationship with ortho   Patient Active Problem List   Diagnosis Date Noted  . CHF (congestive heart failure) 10/31/2011    Class: Chronic  . Ascites, other 10/31/2011    Class: Acute  . DM II (diabetes mellitus, type II), controlled 10/31/2011    Class: Chronic  . 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 Shortness of breath   . Coronary artery disease   . Hypertension   . CHF (congestive heart failure)   . COPD (chronic obstructive pulmonary disease)   . High cholesterol   . Pulmonary embolism 1980's or 1990's  . Pneumonia   . 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 Gout   . Chronic kidney disease     renal insufficiency  . Complication of anesthesia 10/16/1995    "went into heart failure during the OR" (04/12/2013)  . Heart murmur   . Chronic bronchitis     "used to get it often; haven't had it for awhile now" (04/12/2013)  . Type II diabetes mellitus   . Polio 1951    "affected the right side of my body; been weak on that side ever since" (04/12/2013)    Past Surgical History  Procedure  Laterality Date  . Retinal laser procedure Bilateral     "more than once on the left" (04/12/2013)  . Cataract extraction w/ intraocular lens  implant, bilateral Bilateral 2005  . Appendectomy  ~ 1962  . Cardiac catheterization      "I've had a few since 1989" (04/12/2013)  . Cervical discectomy  1984    C6-7  . 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 Exploratory laparotomy  ~ 1962    "took out my appendix while there; bowel was inflammed" (04/12/2013)  . Hydrocele excision / repair      "couple times" (04/12/2013)    History  Substance Use Topics  . Smoking status: Former Smoker -- 0.25 packs/day for 20 years    Types: Cigarettes    Quit date: 10/30/1973  . Smokeless tobacco: Former NeurosurgeonUser    Types: Chew    Quit date: 10/21/1969  . Alcohol Use: 0.0 oz/week     Comment: 04/12/2013 "have ~ 2 drinks/yr; a drink for me is ~ 0.5oz"    History reviewed. No pertinent family history.  Allergies  Allergen Reactions  . Sulfa  Antibiotics Hives    Medication list has been reviewed and updated.  Current Outpatient Prescriptions on File Prior to Visit  Medication Sig Dispense Refill  . carvedilol (COREG) 3.125 MG tablet Take 1 tablet (3.125 mg total) by mouth 2 (two) times daily with a meal.      . fish oil-omega-3 fatty acids 1000 MG capsule Take 1 g by mouth daily.      . furosemide (LASIX) 40 MG tablet Take 1 tablet (40 mg total) by mouth 2 (two) times daily.  30 tablet  1  . glipiZIDE (GLUCOTROL XL) 5 MG 24 hr tablet Take 5 mg by mouth daily.      . insulin NPH-insulin regular (HUMULIN 70/30) (70-30) 100 UNIT/ML injection Inject 5-10 Units into the skin daily as needed (Per Sliding Scale).      . meloxicam (MOBIC) 15 MG tablet Take 1 tablet (15 mg total) by mouth daily.  30 tablet  1  . metolazone (ZAROXOLYN) 2.5 MG tablet Take 1 tablet (2.5 mg total) by mouth every other day.  15 tablet  1  . miconazole (MICOTIN) 2 % powder Apply 1 application topically  as needed for itching (to back of knee).      Marland Kitchen OVER THE COUNTER MEDICATION Take 2 tablets by mouth daily. Ultra guard forte.  Multi vitamin in food form      . potassium chloride SA (K-DUR,KLOR-CON) 20 MEQ tablet Take 1 tablet (20 mEq total) by mouth daily.      Marland Kitchen spironolactone (ALDACTONE) 12.5 mg TABS Take 0.5 tablets (12.5 mg total) by mouth daily.  15 tablet  1   No current facility-administered medications on file prior to visit.    Review of Systems:  As per HPI- otherwise negative.   Physical Examination: Filed Vitals:   04/28/14 1325  BP: 100/52  Pulse: 84  Temp: 97.7 F (36.5 C)  Resp: 18   Filed Vitals:   Cannot calculate BMI with a height equal to zero. Ideal Body Weight:    GEN: WDWN, NAD, Non-toxic, A & O x 3, thin build, sitting in WC.  Accompanied by his wife HEENT: Atraumatic, Normocephalic. Neck supple. No masses, No LAD. Ears and Nose: No external deformity. CV: RRR, No M/G/R. No JVD. No thrill. No extra heart sounds. PULM: CTA B, no wheezes, crackles, rhonchi. No retractions. No resp. distress. No accessory muscle use. EXTR: No c/c/e NEURO Normal gait.  PSYCH: Normally interactive. Conversant. Not depressed or anxious appearing.  Calm demeanor.  No swelling of his LE.  He has tenderness with extension of the right knee.  The knee is stable, minimal effusion.   Ankle is non- swollen but is tender to touch.  No heat or redness.  Foot is also negative for heat, swelling or bruise.  Foot is only tender over the 4th toe which is bruised.    [facility] reading (PRIMARY) by  Dr. Patsy Lager. Right knee: degenerative change no fracture Right ankle:degenerative change no fracture Right foot: degenerative change no fracture Vascular calcifications   RIGHT KNEE - COMPLETE 4+ VIEW  COMPARISON: None.  FINDINGS: Degenerative changes noted about the right knee. Chondrocalcinosis present most likely degenerative. No evidence of fracture or dislocation. Small knee joint  effusion. Atherosclerotic vascular calcification is present.  IMPRESSION: 1. Right knee joint effusion. No acute bony abnormality. No fracture or dislocation. 2. Prominent tract degenerative change. 3. Peripheral vascular disease.  RIGHT ANKLE - COMPLETE 3+ VIEW  COMPARISON: None.  FINDINGS: No evidence of acute fracture  or dislocation. Ankle mortise intact with well preserved joint space. Well preserved bone mineral density for age. Enthesopathic spurring involving the insertion of the Achilles tendon on the posterior calcaneus. Atherosclerotic calcifications involving the peroneal artery in the calf.  IMPRESSION: No acute or significant osseous abnormality.  RIGHT FOOT COMPLETE - 3+ VIEW  COMPARISON: None.  FINDINGS: There is no acute bony or joint abnormality. First MTP and midfoot degenerative change noted.  IMPRESSION: No acute abnormality.  Placed in a hinged knee brace.  He has a walker at home to use as needed  Assessment and Plan: Knee pain, right - Plan: DG Knee Complete 4 Views Right  Ankle pain, right - Plan: DG Ankle Complete Right  Foot pain, right - Plan: DG Foot Complete Right knee sprain after a fall.  Offered to set up an ortho appointment asap, perhaps tomorrow. However he would prefer to wait and see how his sx evolve over the next few days which is reasonable. He will give me a call if not better over the next few days.  He has a copy of his x-rays   Signed Abbe Amsterdam, MD

## 2014-04-28 NOTE — Patient Instructions (Signed)
Wear your knee brace as needed, and use your walker to balance when you are up and about.  Please let me know if you are not feeling better in the next couple of days-in that case we may want to have you see an orthopedist

## 2014-05-10 ENCOUNTER — Telehealth: Payer: Self-pay

## 2014-05-10 DIAGNOSIS — M25561 Pain in right knee: Secondary | ICD-10-CM

## 2014-05-10 NOTE — Telephone Encounter (Signed)
Pt is needing to talk with dr copland regarding a referral to an orthopedic doctor   Best number [phone]

## 2014-05-11 NOTE — Telephone Encounter (Signed)
LM for rtn call. Pended ortho referral.

## 2014-05-11 NOTE — Telephone Encounter (Signed)
Spoke to wife- she states pt is not doing any better. Sent in referral. He wants to go see Ortho Specialist

## 2014-05-17 ENCOUNTER — Ambulatory Visit: Payer: Medicare Other | Admitting: Family Medicine

## 2014-05-23 ENCOUNTER — Encounter: Payer: Self-pay | Admitting: Family Medicine

## 2014-05-23 ENCOUNTER — Ambulatory Visit (INDEPENDENT_AMBULATORY_CARE_PROVIDER_SITE_OTHER): Payer: Medicare HMO | Admitting: Family Medicine

## 2014-05-23 VITALS — BP 113/75 | HR 77 | Temp 97.6°F | Resp 16 | Ht 65.0 in | Wt 133.0 lb

## 2014-05-23 DIAGNOSIS — I499 Cardiac arrhythmia, unspecified: Secondary | ICD-10-CM

## 2014-05-23 DIAGNOSIS — M25561 Pain in right knee: Secondary | ICD-10-CM

## 2014-05-23 DIAGNOSIS — I482 Chronic atrial fibrillation, unspecified: Secondary | ICD-10-CM

## 2014-05-23 DIAGNOSIS — E785 Hyperlipidemia, unspecified: Secondary | ICD-10-CM

## 2014-05-23 DIAGNOSIS — S93409A Sprain of unspecified ligament of unspecified ankle, initial encounter: Secondary | ICD-10-CM

## 2014-05-23 DIAGNOSIS — S93401D Sprain of unspecified ligament of right ankle, subsequent encounter: Secondary | ICD-10-CM

## 2014-05-23 DIAGNOSIS — M25569 Pain in unspecified knee: Secondary | ICD-10-CM

## 2014-05-23 DIAGNOSIS — I4891 Unspecified atrial fibrillation: Secondary | ICD-10-CM

## 2014-05-23 DIAGNOSIS — E119 Type 2 diabetes mellitus without complications: Secondary | ICD-10-CM

## 2014-05-23 DIAGNOSIS — Z5189 Encounter for other specified aftercare: Secondary | ICD-10-CM

## 2014-05-23 NOTE — Progress Notes (Signed)
Office Note 05/24/2014  CC:  Chief Complaint  Patient presents with  . Establish Care  . Referral    Orthopedic   HPI:  [name] is a 73 y.o. White male who is here to establish care. Patient's most recent primary MD: none.  Cardiologists: Dr. Algie Coffer.  Says no other specialists. Old records in EPIC/HL EMR were reviewed prior to or during today's visit.  Reviewed med list, questioned him about his insulin use, which is written as "70/30 per sliding scale as needed". He says he has used it this way for the last year or so, has had rare hypoglycemia.  Avg number of days per week to take insulin 70/30 is 3-4, always once on those days. Last eye exam was 10 yrs ago.  Sounds like he has hx of diab retpathy from his recall of some laser eye procedures.  No acute eye complaints.  Right knee: pain onset one mo ago when he lost balance and contorted knee and ankle on right.  Knee and ankle swelled up, ankle still a bit puffy from it.  He went to [facility] on [address] about 10 d later, x-rays of knee and ankle showed no fracture.  Hinged knee brace was dispensed and it helped a bit initially but pain has continued.  He has not worn brace in about a week.  No hx of probs with right knee before. His right ankle has also hurt since then, some initial swelling, hurts with wt bearing but can ambulate on it. He has not been wearing a brace on ankle.  He is currently using crutches. He comes today asking for orthopedic referral.  Past Medical History  Diagnosis Date  . Atrial fibrillation     ?need old records: ? GI bleed in past, plus pt tends to "self adjust/manage" his meds w/out regard for MD's guidance, so he has NOT been on anticoagulant for this.  . Coronary artery disease   . Hypertension   . CHF (congestive heart failure)   . High cholesterol   . Pulmonary embolism 1980's or 1990's  . Pneumonia   . 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 Gout   . Chronic renal insufficiency,  stage II (mild)   . Heart murmur   . Chronic bronchitis     "used to get it often; haven't had it for awhile now" (04/12/2013)  . Type II diabetes mellitus   . Postpolio syndrome 1951    "affected the right side of my body; been weak on that side ever since" (04/12/2013)  . Severe mitral regurgitation 2013  . Severe tricuspid regurgitation 2013    Past Surgical History  Procedure Laterality Date  . Retinal laser procedure Bilateral     "more than once on the left" (04/12/2013)  . Cataract extraction w/ intraocular lens  implant, bilateral Bilateral 2005  . Appendectomy  ~ 1962  . Cardiac catheterization      "I've had a few since 1989" (04/12/2013)  . Cervical discectomy  1984    C6-7  . 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 Exploratory laparotomy  ~ 1962    "took out my appendix while there; bowel was inflammed" (04/12/2013)  . Hydrocele excision / repair      "couple times" (04/12/2013)  . Transthoracic echocardiogram  2013    LV dilation, EF 30-35%, diffuse hypokinesis, grade I diast dysfxn, severe mitral and  tricuspid regurg, LAE and RAE.  Marland Kitchen Paracentesis  2013-2014    Multiple times, usually pulling off 6-7 liters of fluid  . Cardiac catheterization  12/2012    Severe native CAD, patent bypass grafts x 3, EF 25-30%--med mgmt    Family History  Problem Relation Age of Onset  . Cancer Sister   . Parkinson's disease Brother     History   Social History  . Marital Status: Married    Spouse Name: N/A    Number of Children: N/A  . Years of Education: N/A   Occupational History  . Not on file.   Social History Main Topics  . Smoking status: Former Smoker -- 0.25 packs/day for 20 years    Types: Cigarettes    Quit date: 10/30/1973  . Smokeless tobacco: Former Neurosurgeon    Types: Chew    Quit date: 10/21/1969  . Alcohol Use: 0.0 oz/week     Comment: 04/12/2013 "have ~ 2 drinks/yr; a drink for me is ~ 0.5oz"  . Drug Use: No  . Sexual  Activity: Not Currently   Other Topics Concern  . Not on file   Social History Narrative   Married (2nd marriage).  Has 3 living children, one child deceased.   Occupation: former Music therapist.  Disabled due to cardiovascular problems.   10-15 pack-yr hx tobacco, quit approx 1970s.     Some distant hx of alcohol abuse/binging, but no alcohol since 2013.   NO drug use.          Outpatient Encounter Prescriptions as of 05/23/2014  Medication Sig  . carvedilol (COREG) 3.125 MG tablet Take 1 tablet (3.125 mg total) by mouth 2 (two) times daily with a meal.  . furosemide (LASIX) 40 MG tablet Take 1 tablet (40 mg total) by mouth 2 (two) times daily.  Marland Kitchen glipiZIDE (GLUCOTROL XL) 5 MG 24 hr tablet Take 5 mg by mouth daily.  . insulin NPH-insulin regular (HUMULIN 70/30) (70-30) 100 UNIT/ML injection Inject 5-10 Units into the skin daily as needed (Per Sliding Scale).  . meloxicam (MOBIC) 15 MG tablet Take 1 tablet (15 mg total) by mouth daily.  . metolazone (ZAROXOLYN) 2.5 MG tablet Take 1 tablet (2.5 mg total) by mouth every other day.  . miconazole (MICOTIN) 2 % powder Apply 1 application topically as needed for itching (to back of knee).  Marland Kitchen OVER THE COUNTER MEDICATION Take 2 tablets by mouth daily. Ultra guard forte.  Multi vitamin in food form  . potassium chloride SA (K-DUR,KLOR-CON) 20 MEQ tablet Take 1 tablet (20 mEq total) by mouth daily.  Marland Kitchen spironolactone (ALDACTONE) 12.5 mg TABS Take 0.5 tablets (12.5 mg total) by mouth daily.  . fish oil-omega-3 fatty acids 1000 MG capsule Take 1 g by mouth daily.    Allergies  Allergen Reactions  . Sulfa Antibiotics Hives    ROS Review of Systems  Constitutional: Negative for fever, chills, appetite change and fatigue.  HENT: Negative for congestion, ear pain and sore throat.   Eyes: Negative for discharge, redness and visual disturbance.  Respiratory: Negative for cough, chest tightness, shortness of breath and wheezing.   Cardiovascular:  Negative for chest pain, palpitations and leg swelling.  Gastrointestinal: Negative for nausea, vomiting, abdominal pain, diarrhea and blood in stool.  Genitourinary: Negative for dysuria, urgency, frequency, hematuria, flank pain and difficulty urinating.  Musculoskeletal: Positive for arthralgias (right knee and ankle as per HPI). Negative for back pain, joint swelling, myalgias and neck stiffness.  Skin: Negative for  pallor and rash.  Neurological: Negative for dizziness, speech difficulty, weakness and headaches.  Hematological: Negative for adenopathy. Does not bruise/bleed easily.  Psychiatric/Behavioral: Negative for confusion and sleep disturbance. The patient is not nervous/anxious.     PE; Blood pressure 113/75, pulse 77, temperature 97.6 F (36.4 C), temperature source Temporal, resp. rate 16, height 5\' 5"  (1.651 m), weight 133 lb (60.328 kg), SpO2 99.00%. Gen: Alert, well appearing.  Patient is oriented to person, place, time, and situation. BMW:UXLKENT:Eyes: no injection, icteris, swelling, or exudate.  EOMI, PERRLA. Mouth: lips without lesion/swelling.  Oral mucosa pink and moist. Oropharynx without erythema, exudate, or swelling. CV: Irregular rhythm, rate 70s, 3/6 high pitched systolic murmur.  No rub or gallop. Chest is clear, no wheezing or rales. Normal symmetric air entry throughout both lung fields. No chest wall deformities or tenderness. ABD: soft, ND/NT EXT: no clubbing, cyanosis, or edema.  MUSCULO: knees with bony hypertrophy, no erythema or warmth or effusion.  ROM intact and no instability noted. He has significant tenderness with palpation of medial joint line on right knee.  McMurray's elicits severe pain in medial joint line but I cannot palpate any tissue moving or any hear/feel any popping.  Patellar grind neg.   Right ankle mild swelling of ankle mortise, mild TTP diffusely in soft tissues around this region but no bony tenderness.  Mild pain in ankle with calf  squeeze.  Achilles w/out tenderness.    Ankle ROM mildly tender but strength and tendon function intact.  Pertinent labs:  None today 12 lead EKG today: atrial fibrillation, LBBB.  No old EKG for comparison.  ASSESSMENT AND PLAN:   New pt; obtain old records.  Right medial knee pain Agree with ortho referral--may have meniscal injury. Pt requested [facility] [facility] so I ordered this today.  Right ankle sprain Ankle lace-up support fitted and dispensed to pt today and this helped it feel better immediately. [facility]    Atrial fibrillation I called his cardiologist today, Dr. Algie CofferKadakia, and he gave me some back-story regarding patient's high risk of bleeding and that he has been a poor candidate for anticoagulation (he is not even on an aspirin).  He said he would talk to the patient about this specific issue again at next f/u in his office, which is later this week.  DM II (diabetes mellitus, type II), controlled Pt reports getting his labs routinely checked at Dr. Della GooKadakias and Dr. Algie CofferKadakia agreed to continue with this at the next visit.  We may adjust this in the future as pt's needs dictate. For now, continue current 70/30 insulin regimen and glipizide XL 5mg  qd.  Hyperlipidemia He'll be getting labs at cardiologist's later this week. Need old records to see why he is not on a statin.   An After Visit Summary was printed and given to the patient.  Return in about 4 weeks (around 06/20/2014) for f/u DM and atrial fibrillation.

## 2014-05-23 NOTE — Progress Notes (Signed)
Pre visit review using our clinic review tool, if applicable. No additional management support is needed unless otherwise documented below in the visit note. 

## 2014-05-24 ENCOUNTER — Encounter: Payer: Self-pay | Admitting: Family Medicine

## 2014-05-24 DIAGNOSIS — E785 Hyperlipidemia, unspecified: Secondary | ICD-10-CM | POA: Insufficient documentation

## 2014-05-24 DIAGNOSIS — S93401A Sprain of unspecified ligament of right ankle, initial encounter: Secondary | ICD-10-CM | POA: Insufficient documentation

## 2014-05-24 DIAGNOSIS — I4891 Unspecified atrial fibrillation: Secondary | ICD-10-CM | POA: Insufficient documentation

## 2014-05-24 DIAGNOSIS — M25561 Pain in right knee: Secondary | ICD-10-CM | POA: Insufficient documentation

## 2014-05-24 NOTE — Assessment & Plan Note (Signed)
Agree with ortho referral--may have meniscal injury. Pt requested [facility] so I ordered this today.

## 2014-05-24 NOTE — Assessment & Plan Note (Signed)
Ankle lace-up support fitted and dispensed to pt today and this helped it feel better immediately. [facility].

## 2014-05-24 NOTE — Assessment & Plan Note (Signed)
He'll be getting labs at cardiologist's later this week. Need old records to see why he is not on a statin.

## 2014-05-24 NOTE — Assessment & Plan Note (Signed)
Pt reports getting his labs routinely checked at Dr. Della GooKadakias and Dr. Algie CofferKadakia agreed to continue with this at the next visit.  We may adjust this in the future as pt's needs dictate. For now, continue current 70/30 insulin regimen and glipizide XL 5mg  qd.

## 2014-05-24 NOTE — Assessment & Plan Note (Signed)
I called his cardiologist today, Dr. Algie CofferKadakia, and he gave me some back-story regarding patient's high risk of bleeding and that he has been a poor candidate for anticoagulation (he is not even on an aspirin).  He said he would talk to the patient about this specific issue again at next f/u in his office, which is later this week.

## 2014-06-20 ENCOUNTER — Telehealth: Payer: Self-pay | Admitting: Family Medicine

## 2014-06-20 ENCOUNTER — Ambulatory Visit (INDEPENDENT_AMBULATORY_CARE_PROVIDER_SITE_OTHER): Payer: Medicare HMO | Admitting: Family Medicine

## 2014-06-20 ENCOUNTER — Encounter: Payer: Self-pay | Admitting: Family Medicine

## 2014-06-20 VITALS — BP 128/63 | HR 74 | Temp 97.6°F | Resp 16 | Ht 65.0 in | Wt 136.0 lb

## 2014-06-20 DIAGNOSIS — L989 Disorder of the skin and subcutaneous tissue, unspecified: Secondary | ICD-10-CM

## 2014-06-20 DIAGNOSIS — I4891 Unspecified atrial fibrillation: Secondary | ICD-10-CM

## 2014-06-20 DIAGNOSIS — E785 Hyperlipidemia, unspecified: Secondary | ICD-10-CM

## 2014-06-20 DIAGNOSIS — E119 Type 2 diabetes mellitus without complications: Secondary | ICD-10-CM

## 2014-06-20 DIAGNOSIS — M25561 Pain in right knee: Secondary | ICD-10-CM

## 2014-06-20 DIAGNOSIS — Z23 Encounter for immunization: Secondary | ICD-10-CM

## 2014-06-20 DIAGNOSIS — R21 Rash and other nonspecific skin eruption: Secondary | ICD-10-CM

## 2014-06-20 DIAGNOSIS — M25569 Pain in unspecified knee: Secondary | ICD-10-CM

## 2014-06-20 DIAGNOSIS — I482 Chronic atrial fibrillation, unspecified: Secondary | ICD-10-CM

## 2014-06-20 DIAGNOSIS — Z135 Encounter for screening for eye and ear disorders: Secondary | ICD-10-CM

## 2014-06-20 DIAGNOSIS — Z0000 Encounter for general adult medical examination without abnormal findings: Secondary | ICD-10-CM

## 2014-06-20 LAB — MICROALBUMIN / CREATININE URINE RATIO
Creatinine,U: 17.5 mg/dL
MICROALB UR: 1 mg/dL (ref 0.0–1.9)
MICROALB/CREAT RATIO: 5.7 mg/g (ref 0.0–30.0)

## 2014-06-20 MED ORDER — CETIRIZINE HCL 10 MG PO TABS: ORAL_TABLET | ORAL | Status: DC

## 2014-06-20 NOTE — Progress Notes (Signed)
Pre visit review using our clinic review tool, if applicable. No additional management support is needed unless otherwise documented below in the visit note. 

## 2014-06-20 NOTE — Assessment & Plan Note (Signed)
Pt accepted Tdap today. However, he declined flu vaccine b/c "that shot doesn't agree with me". Also, he said he would consider/think about the prevnar vaccine but wants to just maybe discuss it again at next f/u.

## 2014-06-20 NOTE — Assessment & Plan Note (Addendum)
Pt self manages with  glipizide XL tab and 70/30 insulin---hard to tell exactly what he does but he seems to avoid hypoglycemia.   Awaiting labs (A1c) done recently at his cardiologist's office.  Hopefully this has his lipid panel on it as well. Urine microalb/cr today. Will refer to opthalmologist for diab retpthy screening exam--he says he went to [facility] eye "years ago" so we'll refer back to them. Will do detailed diab foot exam at next f/u in 4 mo.

## 2014-06-20 NOTE — Assessment & Plan Note (Signed)
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 Pt recently self d/c'd a trial of ASA  qd due to excessive ecchymoses on arms. He is averse to further trial of this med and certainly is not a candidate for coumadin. Continue with rate control and diuretics.

## 2014-06-20 NOTE — Telephone Encounter (Signed)
Pls call pt: I reviewed labs done 06/13/14 at Dr. Roseanne Kaufman office, and I have a few recommendations.  Potassium was a little low: needs to increase his KDUR 20 mEQ to one tab TWICE PER DAY. Also, A1c was 8.5%, meaning his average sugar over the last 3-4 months has been around 200.  I recommend he stop taking glipizide (glucotrol XL) and I recommend he start taking his insulin REGULARLY, not just "when he needs it" like he has been trying.  I recommend he take his 70/30 insulin 10 Units SQ before breakfast and 5 Units SQ before supper. Check sugar once every morning and again 2 hours after his LUNCH, write these numbers down and bring them in to office for review with me in 2 wks.  We'll recheck his potassium and kidney function in our lab at that time (pls make this a 30 min visit)-thx

## 2014-06-20 NOTE — Assessment & Plan Note (Signed)
Right upper back x 20+ years per pt. Says he had it biopsied at [facility] about 20+ years ago. As of 06/20/14 pt strongly declines any further evaluation of this lesion.

## 2014-06-20 NOTE — Assessment & Plan Note (Signed)
Not on meds, unclear why. Await recent cardiologist labs--hopefully an FLP was done with these. If not, we'll have him return for fasting lab visit to get this at his earliest convenience.

## 2014-06-20 NOTE — Telephone Encounter (Signed)
Patient aware of results and new medication recommendations.  Patient voiced understanding.  Pt scheduled fasting f/u 07/04/14 @ 9:30.  Also, advised patient that we need lipid panel from him at that appointment as well.

## 2014-06-20 NOTE — Assessment & Plan Note (Signed)
Getting eval by ortho, has MRI set + f/u o/v planned with them.

## 2014-06-20 NOTE — Assessment & Plan Note (Signed)
Unclear etiology. Prednisone has caused signif elevation of his sugars in the past per his report. Will avoid this med for now. Start zyrtec  qd and see how this goes.

## 2014-06-20 NOTE — Progress Notes (Signed)
OFFICE NOTE  06/20/2014  CC:  Chief Complaint  Patient presents with  . Follow-up  . Rash   HPI: Patient is a 73 y.o. Caucasian male who is here for 1 mo f/u DM 2, CHF, and atrial fibrillation. He saw Dr. Algie Coffer after seeing me and labs were done (which ones?  Results?--pt doesn't know and I don't have records at this time).  He does say this would be a 75mo interval between A1cs.  He was stared on ASA  qod but pt says arm bruising occurred to much so he stopped it. Glucose was 116 this morning fasting.  Recalls all numbers <150.  Onset of mildly itchy rash on torso 4-5 days ago: diffuse little pinkish bumps all over trunk and some on inside of right upper arm.   He reports having this same rash once a year for the last few years.  He reports that a medicated powder was rx'd for it in the past but nothing by mouth.  No oral lesions, no swelling of lips/tongue/eyes.  No wheezing/SOB. He recalls no new changes in soaps, detergents, cologne, moisturizers, or meds.  No new foods.  He did see ortho after my last visit with him, they ordered an MRI and he'll get this soon and then have a f/u office visit with them to discuss results/plan.   Pertinent PMH:  Past medical, surgical, social, and family history reviewed and no changes are noted since last office visit.  MEDS:  Outpatient Prescriptions Prior to Visit  Medication Sig Dispense Refill  . carvedilol (COREG) 3.125 MG tablet Take 1 tablet (3.125 mg total) by mouth 2 (two) times daily with a meal.      . furosemide (LASIX) 40 MG tablet Take 1 tablet (40 mg total) by mouth 2 (two) times daily.  30 tablet  1  . glipiZIDE (GLUCOTROL XL) 5 MG 24 hr tablet Take 5 mg by mouth daily.      . insulin NPH-insulin regular (HUMULIN 70/30) (70-30) 100 UNIT/ML injection Inject 5-10 Units into the skin daily as needed (Per Sliding Scale).      . meloxicam (MOBIC) 15 MG tablet Take 1 tablet (15 mg total) by mouth daily.  30 tablet  1  .  metolazone (ZAROXOLYN) 2.5 MG tablet Take 1 tablet (2.5 mg total) by mouth every other day.  15 tablet  1  . miconazole (MICOTIN) 2 % powder Apply 1 application topically as needed for itching (to back of knee).      Marland Kitchen OVER THE COUNTER MEDICATION Take 2 tablets by mouth daily. Ultra guard forte.  Multi vitamin in food form      . potassium chloride SA (K-DUR,KLOR-CON) 20 MEQ tablet Take 1 tablet (20 mEq total) by mouth daily.      Marland Kitchen spironolactone (ALDACTONE) 12.5 mg TABS Take 0.5 tablets (12.5 mg total) by mouth daily.  15 tablet  1  . fish oil-omega-3 fatty acids 1000 MG capsule Take 1 g by mouth daily.       No facility-administered medications prior to visit.    PE: Blood pressure 128/63, pulse 74, temperature 97.6 F (36.4 C), temperature source Temporal, resp. rate 16, height  (1.651 m), weight 136 lb (61.689 kg), SpO2 100.00%. Gen: Alert, well appearing.  Patient is oriented to person, place, time, and situation. JWJ:XBJY: no injection, icteris, swelling, or exudate.  EOMI, PERRLA. Mouth: lips without lesion/swelling.  Oral mucosa pink and moist. Oropharynx without erythema, exudate, or swelling.  CV: irreg irreg  rhythm, 3/6 high pitched/harsh systolic murmur.  S1 is not distinct but S2 is distinct.   Chest is clear, no wheezing or rales. Normal symmetric air entry throughout both lung fields. No chest wall deformities or tenderness. EXT: no clubbing, cyanosis, or edema.  SKIN: diffuse fine papular rash covering back and front of torso, minimal involvement of inner/upper right arm. This does blanch.  No pustules, vesicles, or hives. Upper right portion of back with 2 cm raised/nodular lesion with central scab.  IMPRESSION AND PLAN:  DM II (diabetes mellitus, type II), controlled Pt self manages with  glipizide XL tab and 70/30 insulin---hard to tell exactly what he does but he seems to avoid hypoglycemia.   Awaiting labs (A1c) done recently at his cardiologist's office.   Hopefully this has his lipid panel on it as well. Urine microalb/cr today. Will refer to opthalmologist for diab retpthy screening exam--he says he went to [facility] eye "years ago" so we'll refer back to them. Will do detailed diab foot exam at next f/u in 4 mo.  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 Pt recently self d/c'd a trial of ASA  qd due to excessive ecchymoses on arms. He is averse to further trial of this med and certainly is not a candidate for coumadin. Continue with rate control and diuretics.  Hyperlipidemia Not on meds, unclear why. Await recent cardiologist labs--hopefully an FLP was done with these. If not, we'll have him return for fasting lab visit to get this at his earliest convenience.  Rash and nonspecific skin eruption Unclear etiology. Prednisone has caused signif elevation of his sugars in the past per his report. Will avoid this med for now. Start zyrtec  qd and see how this goes.  Right medial knee pain Getting eval by ortho, has MRI set + f/u o/v planned with them.  Preventative health care Pt accepted Tdap today. However, he declined flu vaccine b/c "that shot doesn't agree with me". Also, he said he would consider/think about the prevnar vaccine but wants to just maybe discuss it again at next f/u.  DM II (diabetes mellitus, type II), controlled Pt self manages with  glipizide XL tab and 70/30 insulin---hard to tell exactly what he does but he seems to avoid hypoglycemia.   Awaiting labs (A1c) done recently at his cardiologist's office.  Hopefully this has his lipid panel on it as well. Urine microalb/cr today. Will refer to opthalmologist for diab retpthy screening exam--he says he went to [facility] eye "years ago" so we'll refer back to them. Will do detailed diab foot exam at next f/u in 4 mo.  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 Pt recently self d/c'd a  trial of ASA  qd due to excessive ecchymoses on arms. He is averse to further trial of this med and certainly is not a candidate for coumadin. Continue with rate control and diuretics.  Hyperlipidemia Not on meds, unclear why. Await recent cardiologist labs--hopefully an FLP was done with these. If not, we'll have him return for fasting lab visit to get this at his earliest convenience.  Rash and nonspecific skin eruption Unclear etiology. Prednisone has caused signif elevation of his sugars in the past per his report. Will avoid this med for now. Start zyrtec  qd and see how this goes.  Right medial knee pain Getting eval by ortho, has MRI set + f/u o/v planned with them.  Preventative health care Pt accepted Tdap today. However,  he declined flu vaccine b/c "that shot doesn't agree with me". Also, he said he would consider/think about the prevnar vaccine but wants to just maybe discuss it again at next f/u.  Skin lesion of back Right upper back x 20+ years per pt. Says he had it biopsied at [facility] about 20+ years ago. As of 06/20/14 pt strongly declines any further evaluation of this lesion.    An After Visit Summary was printed and given to the patient.  FOLLOW UP: 4 mo

## 2014-07-04 ENCOUNTER — Ambulatory Visit (INDEPENDENT_AMBULATORY_CARE_PROVIDER_SITE_OTHER): Payer: Medicare HMO | Admitting: Family Medicine

## 2014-07-04 ENCOUNTER — Encounter: Payer: Self-pay | Admitting: Family Medicine

## 2014-07-04 VITALS — BP 138/69 | HR 80 | Temp 97.8°F | Resp 16 | Ht 65.0 in | Wt 131.0 lb

## 2014-07-04 DIAGNOSIS — E119 Type 2 diabetes mellitus without complications: Secondary | ICD-10-CM

## 2014-07-04 LAB — LIPID PANEL
CHOLESTEROL: 198 mg/dL (ref 0–200)
HDL: 40.4 mg/dL (ref >39.00)
LDL CALC: 139 mg/dL — AB (ref 0–99)
NonHDL: 157.6
TRIGLYCERIDES: 95 mg/dL (ref 0.0–149.0)
Total CHOL/HDL Ratio: 5
VLDL: 19 mg/dL (ref 0.0–40.0)

## 2014-07-04 NOTE — Progress Notes (Signed)
Pre visit review using our clinic review tool, if applicable. No additional management support is needed unless otherwise documented below in the visit note. 

## 2014-07-04 NOTE — Progress Notes (Signed)
OFFICE NOTE  07/04/2014  CC:  Chief Complaint  Patient presents with  . Follow-up     HPI: Patient is a 73 y.o. Caucasian male who is here for f/u DM 2--brings more glucose readings from the last few weeks---these have been primarily 120s avg with a rare reading in 200-300 range.  Lowest gluc 69. Rash/itching that he had last visit has resolved with use of zyrtec.  Most recent labs from Dr. Roseanne Kaufman office did not have recent FLP so he needs this blood test done today.  Pertinent PMH:  Past medical, surgical, social, and family history reviewed and no changes are noted since last office visit.  MEDS:  Outpatient Prescriptions Prior to Visit  Medication Sig Dispense Refill  . carvedilol (COREG) 3.125 MG tablet Take 1 tablet (3.125 mg total) by mouth 2 (two) times daily with a meal.      . cetirizine (ZYRTEC) 10 MG tablet 1 tab po qd prn itchy rash  30 tablet  3  . fish oil-omega-3 fatty acids 1000 MG capsule Take 1 g by mouth daily.      . furosemide (LASIX) 40 MG tablet Take 1 tablet (40 mg total) by mouth 2 (two) times daily.  30 tablet  1  . glipiZIDE (GLUCOTROL XL) 5 MG 24 hr tablet Take 5 mg by mouth daily.      . insulin NPH-insulin regular (HUMULIN 70/30) (70-30) 100 UNIT/ML injection Inject 5-10 Units into the skin daily as needed (Per Sliding Scale).      . meloxicam (MOBIC) 15 MG tablet Take 1 tablet (15 mg total) by mouth daily.  30 tablet  1  . metolazone (ZAROXOLYN) 2.5 MG tablet Take 1 tablet (2.5 mg total) by mouth every other day.  15 tablet  1  . miconazole (MICOTIN) 2 % powder Apply 1 application topically as needed for itching (to back of knee).      Marland Kitchen OVER THE COUNTER MEDICATION Take 2 tablets by mouth daily. Ultra guard forte.  Multi vitamin in food form      . potassium chloride SA (K-DUR,KLOR-CON) 20 MEQ tablet Take 1 tablet (20 mEq total) by mouth daily.      Marland Kitchen spironolactone (ALDACTONE) 12.5 mg TABS Take 0.5 tablets (12.5 mg total) by mouth daily.  15 tablet   1   No facility-administered medications prior to visit.    PE: Blood pressure 138/69, pulse 80, temperature 97.8 F (36.6 C), temperature source Temporal, resp. rate 16, height  (1.651 m), weight 131 lb (59.421 kg), SpO2 99.00%. Gen: Alert, well appearing.  Patient is oriented to person, place, time, and situation. AFFECT: pleasant, lucid thought and speech. No further exam today.  IMPRESSION AND PLAN:  1) DM 2, control is good as per Dr. Roseanne Kaufman (cardiologist) most recent lab.  I don't have this to view today but I have signed off on it and it is waiting to be scanned in. Pt due for FLP--this will be drawn today. He declined prevnar today--says he'll consider it at future visit. He has diabetic retinopathy screening exam set up for 07/13/14.  Regarding his knee, his orthopedist did MRI and per pt report he did not have any tears.  He is set up for PT to start 07/15/14.  FOLLOW UP: keep appt set for 10/19/14.

## 2014-07-11 ENCOUNTER — Telehealth: Payer: Self-pay

## 2014-07-11 DIAGNOSIS — E785 Hyperlipidemia, unspecified: Secondary | ICD-10-CM

## 2014-07-11 MED ORDER — ATORVASTATIN CALCIUM 40 MG PO TABS: 40.0000 mg | ORAL_TABLET | Freq: Every day | ORAL | Status: DC

## 2014-07-11 NOTE — Telephone Encounter (Signed)
Pt notified in regards to lab results, orders for labs put in and atorvastatin 40 mg 1 tab po daily qty 30 3 rfs sent to [facility]

## 2014-07-13 ENCOUNTER — Encounter: Payer: Self-pay | Admitting: Family Medicine

## 2014-07-13 ENCOUNTER — Telehealth: Payer: Self-pay | Admitting: Family Medicine

## 2014-07-13 NOTE — Telephone Encounter (Signed)
FYI: [place] [facility] called stating that patient was working w/ them on his ankle therapy from 05/24/14-06/09/14 but now he has decided to stop therapy on his ankle since they are also working on his knee.  Patient wants to focus on knee at this time.

## 2014-07-18 ENCOUNTER — Encounter: Payer: Self-pay | Admitting: Family Medicine

## 2014-09-12 ENCOUNTER — Other Ambulatory Visit (INDEPENDENT_AMBULATORY_CARE_PROVIDER_SITE_OTHER): Payer: Medicare HMO

## 2014-09-12 DIAGNOSIS — E785 Hyperlipidemia, unspecified: Secondary | ICD-10-CM

## 2014-09-29 ENCOUNTER — Encounter (HOSPITAL_COMMUNITY): Payer: Self-pay | Admitting: Cardiovascular Disease

## 2014-10-19 ENCOUNTER — Encounter: Payer: Self-pay | Admitting: Family Medicine

## 2014-10-19 ENCOUNTER — Ambulatory Visit (INDEPENDENT_AMBULATORY_CARE_PROVIDER_SITE_OTHER): Payer: Commercial Managed Care - HMO | Admitting: Family Medicine

## 2014-10-19 VITALS — BP 129/83 | HR 81 | Temp 97.6°F | Resp 18 | Ht 65.0 in | Wt 143.0 lb

## 2014-10-19 DIAGNOSIS — I420 Dilated cardiomyopathy: Secondary | ICD-10-CM

## 2014-10-19 DIAGNOSIS — E785 Hyperlipidemia, unspecified: Secondary | ICD-10-CM

## 2014-10-19 DIAGNOSIS — I429 Cardiomyopathy, unspecified: Secondary | ICD-10-CM

## 2014-10-19 DIAGNOSIS — E118 Type 2 diabetes mellitus with unspecified complications: Secondary | ICD-10-CM | POA: Diagnosis not present

## 2014-10-19 LAB — BASIC METABOLIC PANEL
BUN: 54 mg/dL — ABNORMAL HIGH (ref 6–23)
CO2: 29 mEq/L (ref 19–32)
Calcium: 9.7 mg/dL (ref 8.4–10.5)
Chloride: 92 mEq/L — ABNORMAL LOW (ref 96–112)
Creatinine, Ser: 1.4 mg/dL (ref 0.4–1.5)
GFR: 54.36 mL/min — ABNORMAL LOW (ref >60.00)
GLUCOSE: 135 mg/dL — AB (ref 70–99)
POTASSIUM: 3.6 meq/L (ref 3.5–5.1)
SODIUM: 132 meq/L — AB (ref 135–145)

## 2014-10-19 LAB — HEMOGLOBIN A1C: Hgb A1c MFr Bld: 9.1 % — ABNORMAL HIGH (ref 4.6–6.5)

## 2014-10-19 NOTE — Progress Notes (Signed)
OFFICE NOTE  10/19/2014  CC:  Chief Complaint  Patient presents with  . Follow-up    fasting     HPI: Patient is a 73 y.o. Caucasian male who is here for 4 mo f/u DM 2, Hyperlipidemia, nonischemic dilated cardiomyopathy (EF 25-30%). Pt says that b/c of side effects from zocor in the past he refuses to take any statins EVER.   Therefore, he never started the atorva that I recommended he start after his last visit. Says his vision is fine. Glucoses: "some d ays good and some days high"-less than 70 but not more than once a month.  Gluc >300 only once in the last year. He takes glipizide every morning.  He takes Humulin 70/30, avg 15 units, usually 5-6 days a week once a day in the morning depending on the carb content of his meals.  He is fasting today.  No CP, no SOB, no palpitations.  Has f/u appt with Dr. Algie CofferKadakia 11/16/14. No burning, tingling, or numbness in feet.  Pertinent PMH:  Past medical, surgical, social, and family history reviewed and no changes are noted since last office visit. Past Surgical History  Procedure Laterality Date  . Retinal laser procedure Bilateral     "more than once on the left" (04/12/2013)  . Cataract extraction w/ intraocular lens  implant, bilateral Bilateral 2005  . Appendectomy  ~ 1962  . Cardiac catheterization      "I've had a few since 1989" (04/12/2013)  . Cervical discectomy  1984    C6-7  . 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 Exploratory laparotomy  ~ 1962    "took out my appendix while there; bowel was inflammed" (04/12/2013)  . Hydrocele excision / repair      "couple times" (04/12/2013)  . Transthoracic echocardiogram  2013    LV dilation, EF 30-35%, diffuse hypokinesis, grade I diast dysfxn, severe mitral and tricuspid regurg, LAE and RAE.  Marland Kitchen. Paracentesis  2013-2014    Multiple times, usually pulling off 6-7 liters of fluid  . Cardiac catheterization  12/2012    Severe native CAD, patent bypass  grafts x 3, EF 25-30%--med mgmt  . Left and right heart catheterization with coronary/graft angiogram  01/13/2013    Procedure: LEFT AND RIGHT HEART CATHETERIZATION WITH Isabel CapriceORONARY/GRAFT ANGIOGRAM;  Surgeon: Ricki RodriguezAjay S [name], MD;  Location: [facility] CATH LAB;  Service: Cardiovascular;;    MEDS:  Outpatient Prescriptions Prior to Visit  Medication Sig Dispense Refill  . carvedilol (COREG) 3.125 MG tablet Take 1 tablet (3.125 mg total) by mouth 2 (two) times daily with a meal.    . fish oil-omega-3 fatty acids 1000 MG capsule Take 1 g by mouth daily.    . furosemide (LASIX) 40 MG tablet Take 1 tablet (40 mg total) by mouth 2 (two) times daily. 30 tablet 1  . glipiZIDE (GLUCOTROL XL) 5 MG 24 hr tablet Take 5 mg by mouth daily.    . insulin NPH-insulin regular (HUMULIN 70/30) (70-30) 100 UNIT/ML injection Inject 5-10 Units into the skin daily as needed (Per Sliding Scale).    . metolazone (ZAROXOLYN) 2.5 MG tablet Take 1 tablet (2.5 mg total) by mouth every other day. 15 tablet 1  . OVER THE COUNTER MEDICATION Take 2 tablets by mouth daily. Ultra guard forte.  Multi vitamin in food form    . potassium chloride SA (K-DUR,KLOR-CON) 20 MEQ tablet Take 1 tablet (20 mEq total) by mouth  daily.    . spironolactone (ALDACTONE) 12.5 mg TABS Take 0.5 tablets (12.5 mg total) by mouth daily. 15 tablet 1  . atorvastatin (LIPITOR) 40 MG tablet Take 1 tablet (40 mg total) by mouth daily. (Patient not taking: Reported on 10/19/2014) 30 tablet 3  . cetirizine (ZYRTEC) 10 MG tablet 1 tab po qd prn itchy rash (Patient not taking: Reported on 10/19/2014) 30 tablet 3  . meloxicam (MOBIC) 15 MG tablet Take 1 tablet (15 mg total) by mouth daily. (Patient not taking: Reported on 10/19/2014) 30 tablet 1  . miconazole (MICOTIN) 2 % powder Apply 1 application topically as needed for itching (to back of knee).     No facility-administered medications prior to visit.    PE: Blood pressure 129/83, pulse 81, temperature 97.6 F (36.4  C), temperature source Temporal, resp. rate 18, height 5\' 5"  (1.651 m), weight 143 lb (64.864 kg), SpO2 100 %. Gen: Alert, well appearing.  Patient is oriented to person, place, time, and situation. FEET: Foot exam - both normal; no swelling, tenderness or skin or vascular lesions. Color and temperature is normal. Sensation is intact. Peripheral pulses are palpable. Toenails are thickened (great toes only)   IMPRESSION AND PLAN:  1) DM 2, home glucose control not ideal. HbA1c today.  2) Hyperlipidemia/CAD: pt absolutely refuses statins.  3) Nonischemic dilated cardiomyopathy--continue current meds. CMET today.  An After Visit Summary was printed and given to the patient.  Pt declined pneumovax, prevnar, and flu vaccines today.  FOLLOW UP: 27mo

## 2014-10-19 NOTE — Progress Notes (Signed)
Pre visit review using our clinic review tool, if applicable. No additional management support is needed unless otherwise documented below in the visit note. 

## 2014-12-26 ENCOUNTER — Inpatient Hospital Stay (HOSPITAL_COMMUNITY)
Admission: AD | Admit: 2014-12-26 | Discharge: 2014-12-28 | DRG: 292 | Disposition: A | Discharge status: Home / Self Care | Payer: PPO | Source: Ambulatory Visit | Attending: Cardiovascular Disease | Admitting: Cardiovascular Disease

## 2014-12-26 ENCOUNTER — Encounter (HOSPITAL_COMMUNITY): Payer: Self-pay | Admitting: Surgery

## 2014-12-26 DIAGNOSIS — Z86711 Personal history of pulmonary embolism: Secondary | ICD-10-CM

## 2014-12-26 DIAGNOSIS — I4891 Unspecified atrial fibrillation: Secondary | ICD-10-CM | POA: Diagnosis present

## 2014-12-26 DIAGNOSIS — Z794 Long term (current) use of insulin: Secondary | ICD-10-CM | POA: Diagnosis not present

## 2014-12-26 DIAGNOSIS — Z9842 Cataract extraction status, left eye: Secondary | ICD-10-CM | POA: Diagnosis not present

## 2014-12-26 DIAGNOSIS — E11359 Type 2 diabetes mellitus with proliferative diabetic retinopathy without macular edema: Secondary | ICD-10-CM | POA: Diagnosis present

## 2014-12-26 DIAGNOSIS — Z951 Presence of aortocoronary bypass graft: Secondary | ICD-10-CM

## 2014-12-26 DIAGNOSIS — E46 Unspecified protein-calorie malnutrition: Secondary | ICD-10-CM | POA: Diagnosis present

## 2014-12-26 DIAGNOSIS — I361 Nonrheumatic tricuspid (valve) insufficiency: Secondary | ICD-10-CM | POA: Diagnosis present

## 2014-12-26 DIAGNOSIS — Z79899 Other long term (current) drug therapy: Secondary | ICD-10-CM

## 2014-12-26 DIAGNOSIS — M109 Gout, unspecified: Secondary | ICD-10-CM | POA: Diagnosis present

## 2014-12-26 DIAGNOSIS — Z87891 Personal history of nicotine dependence: Secondary | ICD-10-CM | POA: Diagnosis not present

## 2014-12-26 DIAGNOSIS — I5023 Acute on chronic systolic (congestive) heart failure: Principal | ICD-10-CM

## 2014-12-26 DIAGNOSIS — I340 Nonrheumatic mitral (valve) insufficiency: Secondary | ICD-10-CM | POA: Diagnosis present

## 2014-12-26 DIAGNOSIS — I129 Hypertensive chronic kidney disease with stage 1 through stage 4 chronic kidney disease, or unspecified chronic kidney disease: Secondary | ICD-10-CM | POA: Diagnosis present

## 2014-12-26 DIAGNOSIS — Z961 Presence of intraocular lens: Secondary | ICD-10-CM | POA: Diagnosis present

## 2014-12-26 DIAGNOSIS — N183 Chronic kidney disease, stage 3 (moderate): Secondary | ICD-10-CM | POA: Diagnosis present

## 2014-12-26 DIAGNOSIS — I2510 Atherosclerotic heart disease of native coronary artery without angina pectoris: Secondary | ICD-10-CM | POA: Diagnosis present

## 2014-12-26 DIAGNOSIS — I472 Ventricular tachycardia: Secondary | ICD-10-CM | POA: Diagnosis present

## 2014-12-26 DIAGNOSIS — Z9841 Cataract extraction status, right eye: Secondary | ICD-10-CM

## 2014-12-26 DIAGNOSIS — E785 Hyperlipidemia, unspecified: Secondary | ICD-10-CM | POA: Diagnosis present

## 2014-12-26 DIAGNOSIS — Z6823 Body mass index (BMI) 23.0-23.9, adult: Secondary | ICD-10-CM

## 2014-12-26 DIAGNOSIS — R188 Other ascites: Secondary | ICD-10-CM | POA: Diagnosis present

## 2014-12-26 DIAGNOSIS — I420 Dilated cardiomyopathy: Secondary | ICD-10-CM | POA: Diagnosis present

## 2014-12-26 DIAGNOSIS — I509 Heart failure, unspecified: Secondary | ICD-10-CM

## 2014-12-26 DIAGNOSIS — E119 Type 2 diabetes mellitus without complications: Secondary | ICD-10-CM

## 2014-12-26 DIAGNOSIS — E876 Hypokalemia: Secondary | ICD-10-CM | POA: Diagnosis present

## 2014-12-26 DIAGNOSIS — R609 Edema, unspecified: Secondary | ICD-10-CM | POA: Diagnosis present

## 2014-12-26 LAB — CBC WITH DIFFERENTIAL/PLATELET
BASOS PCT: 1 % (ref 0–1)
Basophils Absolute: 0 10*3/uL (ref 0.0–0.1)
EOS ABS: 0.2 10*3/uL (ref 0.0–0.7)
Eosinophils Relative: 4 % (ref 0–5)
HCT: 37.9 % — ABNORMAL LOW (ref 39.0–52.0)
HEMOGLOBIN: 12.9 g/dL — AB (ref 13.0–17.0)
Lymphocytes Relative: 15 % (ref 12–46)
Lymphs Abs: 0.7 10*3/uL (ref 0.7–4.0)
MCH: 29.9 pg (ref 26.0–34.0)
MCHC: 34 g/dL (ref 30.0–36.0)
MCV: 87.7 fL (ref 78.0–100.0)
MONOS PCT: 9 % (ref 3–12)
Monocytes Absolute: 0.4 10*3/uL (ref 0.1–1.0)
Neutro Abs: 3.5 10*3/uL (ref 1.7–7.7)
Neutrophils Relative %: 71 % (ref 43–77)
Platelets: 120 10*3/uL — ABNORMAL LOW (ref 150–400)
RBC: 4.32 MIL/uL (ref 4.22–5.81)
RDW: 15.7 % — ABNORMAL HIGH (ref 11.5–15.5)
WBC: 4.9 10*3/uL (ref 4.0–10.5)

## 2014-12-26 LAB — COMPREHENSIVE METABOLIC PANEL
ALBUMIN: 3.5 g/dL (ref 3.5–5.2)
ALK PHOS: 92 U/L (ref 39–117)
ALT: 14 U/L (ref 0–53)
ANION GAP: 12 (ref 5–15)
AST: 30 U/L (ref 0–37)
BUN: 64 mg/dL — AB (ref 6–23)
CO2: 24 mmol/L (ref 19–32)
Calcium: 8.8 mg/dL (ref 8.4–10.5)
Chloride: 95 mmol/L — ABNORMAL LOW (ref 96–112)
Creatinine, Ser: 1.58 mg/dL — ABNORMAL HIGH (ref 0.50–1.35)
GFR calc Af Amer: 48 mL/min — ABNORMAL LOW (ref >90)
GFR calc non Af Amer: 41 mL/min — ABNORMAL LOW (ref >90)
Glucose, Bld: 262 mg/dL — ABNORMAL HIGH (ref 70–99)
Potassium: 3 mmol/L — ABNORMAL LOW (ref 3.5–5.1)
Sodium: 131 mmol/L — ABNORMAL LOW (ref 135–145)
Total Bilirubin: 1.3 mg/dL — ABNORMAL HIGH (ref 0.3–1.2)
Total Protein: 8 g/dL (ref 6.0–8.3)

## 2014-12-26 LAB — GLUCOSE, CAPILLARY
Glucose-Capillary: 176 mg/dL — ABNORMAL HIGH (ref 70–99)
Glucose-Capillary: 240 mg/dL — ABNORMAL HIGH (ref 70–99)

## 2014-12-26 LAB — TROPONIN I: Troponin I: 0.05 ng/mL — ABNORMAL HIGH (ref <0.031)

## 2014-12-26 LAB — BRAIN NATRIURETIC PEPTIDE: B Natriuretic Peptide: 912.6 pg/mL — ABNORMAL HIGH (ref 0.0–100.0)

## 2014-12-26 MED ORDER — SPIRONOLACTONE 12.5 MG HALF TABLET
12.5000 mg | ORAL_TABLET | Freq: Every day | ORAL | Status: DC
  Administered 2014-12-27: 12.5 mg via ORAL
  Filled 2014-12-26: qty 1

## 2014-12-26 MED ORDER — ACETAMINOPHEN 325 MG PO TABS: 650.0000 mg | ORAL_TABLET | ORAL | Status: DC | PRN

## 2014-12-26 MED ORDER — HEPARIN SODIUM (PORCINE) 5000 UNIT/ML IJ SOLN
5000.0000 [IU] | Freq: Three times a day (TID) | INTRAMUSCULAR | Status: DC
  Administered 2014-12-26 – 2014-12-28 (×4): 5000 [IU] via SUBCUTANEOUS
  Filled 2014-12-26 (×8): qty 1

## 2014-12-26 MED ORDER — INSULIN ASPART 100 UNIT/ML ~~LOC~~ SOLN
0.0000 [IU] | Freq: Three times a day (TID) | SUBCUTANEOUS | Status: DC
  Administered 2014-12-26: 2 [IU] via SUBCUTANEOUS
  Administered 2014-12-27: 1 [IU] via SUBCUTANEOUS
  Administered 2014-12-27: 3 [IU] via SUBCUTANEOUS
  Administered 2014-12-27 – 2014-12-28 (×2): 2 [IU] via SUBCUTANEOUS

## 2014-12-26 MED ORDER — POTASSIUM CHLORIDE CRYS ER 20 MEQ PO TBCR
20.0000 meq | EXTENDED_RELEASE_TABLET | Freq: Every day | ORAL | Status: DC
  Administered 2014-12-27 (×2): 20 meq via ORAL
  Filled 2014-12-26 (×3): qty 1

## 2014-12-26 MED ORDER — SODIUM CHLORIDE 0.9 % IV SOLN: 250.0000 mL | INTRAVENOUS | Status: DC | PRN

## 2014-12-26 MED ORDER — METOLAZONE 2.5 MG PO TABS: 2.5000 mg | ORAL_TABLET | ORAL | Status: DC

## 2014-12-26 MED ORDER — POTASSIUM CHLORIDE ER 10 MEQ PO TBCR
20.0000 meq | EXTENDED_RELEASE_TABLET | Freq: Once | ORAL | Status: AC
  Administered 2014-12-26: 20 meq via ORAL
  Filled 2014-12-26: qty 2

## 2014-12-26 MED ORDER — POTASSIUM CHLORIDE CRYS ER 20 MEQ PO TBCR: 20.0000 meq | EXTENDED_RELEASE_TABLET | Freq: Two times a day (BID) | ORAL | Status: DC

## 2014-12-26 MED ORDER — FUROSEMIDE 10 MG/ML IJ SOLN
80.0000 mg | Freq: Two times a day (BID) | INTRAMUSCULAR | Status: DC
Start: 2014-12-26 — End: 2014-12-27
  Administered 2014-12-26 – 2014-12-27 (×2): 80 mg via INTRAVENOUS
  Filled 2014-12-26 (×4): qty 8

## 2014-12-26 MED ORDER — INSULIN ASPART PROT & ASPART (70-30 MIX) 100 UNIT/ML ~~LOC~~ SUSP
10.0000 [IU] | Freq: Two times a day (BID) | SUBCUTANEOUS | Status: DC
  Administered 2014-12-26 – 2014-12-28 (×4): 10 [IU] via SUBCUTANEOUS
  Filled 2014-12-26 (×2): qty 10

## 2014-12-26 MED ORDER — SODIUM CHLORIDE 0.9 % IJ SOLN
3.0000 mL | Freq: Two times a day (BID) | INTRAMUSCULAR | Status: DC
  Administered 2014-12-26 – 2014-12-28 (×4): 3 mL via INTRAVENOUS

## 2014-12-26 MED ORDER — ONDANSETRON HCL 4 MG/2ML IJ SOLN
4.0000 mg | Freq: Four times a day (QID) | INTRAMUSCULAR | Status: DC | PRN
Start: 2014-12-26 — End: 2014-12-28

## 2014-12-26 MED ORDER — CARVEDILOL 3.125 MG PO TABS
3.1250 mg | ORAL_TABLET | Freq: Two times a day (BID) | ORAL | Status: DC
  Administered 2014-12-26 – 2014-12-28 (×4): 3.125 mg via ORAL
  Filled 2014-12-26 (×6): qty 1

## 2014-12-26 MED ORDER — POTASSIUM CHLORIDE CRYS ER 20 MEQ PO TBCR
40.0000 meq | EXTENDED_RELEASE_TABLET | Freq: Every day | ORAL | Status: DC
  Administered 2014-12-27 – 2014-12-28 (×2): 40 meq via ORAL
  Filled 2014-12-26 (×2): qty 2

## 2014-12-26 MED ORDER — SODIUM CHLORIDE 0.9 % IJ SOLN: 3.0000 mL | INTRAMUSCULAR | Status: DC | PRN

## 2014-12-26 NOTE — Progress Notes (Signed)
Pt arrived to the unit as a direct admit at 1430; pt oriented to the unit; vitals taken, telemetry applied and verified; pt denies any pain; IV started to left forearm; pt has three bandaid to left upper back which pt report its part of his history of skin issues; pt oriented to the unit and room; Dr. Algie CofferKadakia called and notified of pt arrival to the floor. Pt in bed comfortably with spouse at bedside. Bed alarm set d/t pt reporting fallings at home. Will continue to monitor pt quietly. Arabella MerlesP. Amo Jakari Sada RN.

## 2014-12-26 NOTE — H&P (Cosign Needed)
Referring Physician:  [name] is an 74 y.o. male.                       Chief Complaint: Weight gain, leg swelling and shortness of breath.  HPI: 74 years old male with known CAD, CABG, DM, II and CHF has leg edema, ascites and weight gain with dyspnea. No fever or cough.  Past Medical History  Diagnosis Date  . Atrial fibrillation     ?need old records: ? GI bleed in past, plus pt tends to "self adjust/manage" his meds w/out regard for MD's guidance, so he has NOT been on anticoagulant for this.  . Coronary artery disease   . Hypertension   . CHF (congestive heart failure)   . High cholesterol   . Pulmonary embolism 1980's or 1990's  . Pneumonia   . 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 Gout   . Chronic renal insufficiency, stage II (mild)   . Heart murmur   . Chronic bronchitis     "used to get it often; haven't had it for awhile now" (04/12/2013)  . Type II diabetes mellitus   . Postpolio syndrome 1951    "affected the right side of my body; been weak on that side ever since" (04/12/2013)  . Severe mitral regurgitation 2013  . Severe tricuspid regurgitation 2013  . 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 Dione BoozeGroat)      Past Surgical History  Procedure Laterality Date  . Retinal laser procedure Bilateral     "more than once on the left" (04/12/2013)  . Cataract extraction w/ intraocular lens  implant, bilateral Bilateral 2005  . Appendectomy  ~ 1962  . Cardiac catheterization      "I've had a few since 1989" (04/12/2013)  . Cervical discectomy  1984    C6-7  . 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 Exploratory laparotomy  ~ 1962    "took out my appendix while there; bowel was inflammed" (04/12/2013)  . Hydrocele excision / repair      "couple times" (04/12/2013)  . Transthoracic echocardiogram  2013    LV dilation, EF 30-35%, diffuse hypokinesis, grade I diast dysfxn, severe mitral  and tricuspid regurg, LAE and RAE.  Marland Kitchen. Paracentesis  2013-2014    Multiple times, usually pulling off 6-7 liters of fluid  . Cardiac catheterization  12/2012    Severe native CAD, patent bypass grafts x 3, EF 25-30%--med mgmt  . Left and right heart catheterization with coronary/graft angiogram  01/13/2013    Procedure: LEFT AND RIGHT HEART CATHETERIZATION WITH Isabel CapriceORONARY/GRAFT ANGIOGRAM;  Surgeon: Francisco RodriguezAjay S Chinenye Katzenberger, MD;  Location: [facility] CATH [facility];  Service: Cardiovascular;;    Family History  Problem Relation Age of Onset  . Cancer Sister   . Parkinson's disease Brother    Social History:  reports that he quit smoking about 41 years ago. His smoking use included Cigarettes. He has a 5 pack-year smoking history. He quit smokeless tobacco use about 45 years ago. His smokeless tobacco use included Chew. He reports that he drinks alcohol. He reports that he does not use illicit drugs.  Allergies:  Allergies  Allergen Reactions  . Sulfa Antibiotics Rash    Medications Prior to Admission  Medication Sig Dispense Refill  . carvedilol (COREG) 3.125 MG tablet Take 1 tablet (3.125 mg  total) by mouth 2 (two) times daily with a meal.    . cetirizine (ZYRTEC) 10 MG tablet 1 tab po qd prn itchy rash (Patient not taking: Reported on 10/19/2014) 30 tablet 3  . furosemide (LASIX) 40 MG tablet Take 1 tablet (40 mg total) by mouth 2 (two) times daily. (Patient not taking: Reported on 12/26/2014) 30 tablet 1  . furosemide (LASIX) 80 MG tablet Take 80 mg by mouth 3 (three) times daily.    . insulin NPH-insulin regular (HUMULIN 70/30) (70-30) 100 UNIT/ML injection Inject 10-35 Units into the skin daily with supper.     . meloxicam (MOBIC) 15 MG tablet Take 1 tablet (15 mg total) by mouth daily. (Patient not taking: Reported on 12/26/2014) 30 tablet 1  . metolazone (ZAROXOLYN) 2.5 MG tablet Take 1 tablet (2.5 mg total) by mouth every other day. 15 tablet 1  . miconazole (MICOTIN) 2 % powder Apply 1 application topically 3  (three) times daily as needed for itching (rash).     Marland Kitchen OVER THE COUNTER MEDICATION Take 2 capsules by mouth every morning. Ultra guard forte.  Multi vitamin in food form    . potassium chloride SA (K-DUR,KLOR-CON) 20 MEQ tablet Take 1 tablet (20 mEq total) by mouth daily. (Patient taking differently: Take 20-40 mEq by mouth 2 (two) times daily. 2 tabs in the morning and 1 tab in the evening)    . spironolactone (ALDACTONE) 12.5 mg TABS Take 0.5 tablets (12.5 mg total) by mouth daily. 15 tablet 1    No results found for this or any previous visit (from the past 48 hour(s)). No results found.  Review Of Systems Constitutional: Positive for malaise/fatigue. Negative for fever, chills, weight loss and diaphoresis.  HENT: Negative for hearing loss, nosebleeds, sore throat and ear discharge.  Eyes: Negative for blurred vision, pain and discharge.  Respiratory: Positive for shortness of breath. Negative for cough and sputum production.  Cardiovascular: Positive for orthopnea. Negative for chest pain and leg swelling.  Gastrointestinal: Positive for abdominal pain. Negative for heartburn, nausea and constipation. + ascites.  Genitourinary: Negative for dysuria and urgency.  Musculoskeletal: Positive for joint pain. Negative for myalgias and back pain.  Skin: Negative for rash.  Neurological: Positive for weakness. Negative for dizziness, tremors, focal weakness, seizures and headaches.  Psychiatric/Behavioral: Positive for depression. Negative for suicidal ideas, memory loss and substance abuse. The patient is not nervous/anxious and does not have insomnia  Blood pressure 119/60, pulse 78, temperature 98 F (36.7 C), temperature source Oral, resp. rate 18, height  (1.651 m), weight 65.681 kg (144 lb 12.8 oz), SpO2 98 %. Physical Exam: Constitutional: He appears averagely-developed and poorly nourished.  HENT: Head: Normocephalic and atraumatic. Mouth/Throat: No oropharyngeal  exudate. Tongue-pink, wet and midline. Eyes: Conjunctivae are normal. Pupils are equal, round, and reactive to light. No scleral icterus.  Neck: Normal range of motion. JVD present. No tracheal deviation present. No thyromegaly present.  Cardiovascular: Normal rate and regular rhythm. III/VI systolic murmur heard.  Respiratory: He is in mild respiratory distress. He has few basal rales.  GI: Soft. He exhibits marked distension. There is no tenderness. There is no rebound.  Musculoskeletal: He exhibits 1 + edema of lower leg and no tenderness.  Lymphadenopathy: He has no cervical adenopathy.  Neurological: He is alert and oriented to person, place, and time. No cranial nerve deficit. He moves all 4 extremities. Skin: Skin is warm and dry.  Psychiatric: He has a normal mood and affect.  Assessment/Plan Acute on chronic left heart systolic failure.  CAD CABG Hypokalemia  DM, II  Dilated cardiomyopathy Ascites  Admit/IV lasix, Home medications, Chest X-Ray, limited echo.  Francisco Rodriguez, MD  12/26/2014, 3:55 PM

## 2014-12-26 NOTE — Progress Notes (Signed)
Utilization Review Completed.Dorcas CarrowDowell, Dezyre Hoefer T3/04/2015

## 2014-12-27 ENCOUNTER — Inpatient Hospital Stay (HOSPITAL_COMMUNITY): Payer: PPO

## 2014-12-27 LAB — BASIC METABOLIC PANEL
ANION GAP: 9 (ref 5–15)
BUN: 70 mg/dL — ABNORMAL HIGH (ref 6–23)
CHLORIDE: 97 mmol/L (ref 96–112)
CO2: 27 mmol/L (ref 19–32)
CREATININE: 1.6 mg/dL — AB (ref 0.50–1.35)
Calcium: 9 mg/dL (ref 8.4–10.5)
GFR calc Af Amer: 47 mL/min — ABNORMAL LOW (ref >90)
GFR calc non Af Amer: 41 mL/min — ABNORMAL LOW (ref >90)
GLUCOSE: 142 mg/dL — AB (ref 70–99)
Potassium: 3.3 mmol/L — ABNORMAL LOW (ref 3.5–5.1)
Sodium: 133 mmol/L — ABNORMAL LOW (ref 135–145)

## 2014-12-27 LAB — GLUCOSE, CAPILLARY
Glucose-Capillary: 141 mg/dL — ABNORMAL HIGH (ref 70–99)
Glucose-Capillary: 240 mg/dL — ABNORMAL HIGH (ref 70–99)
Glucose-Capillary: 182 mg/dL — ABNORMAL HIGH (ref 70–99)
Glucose-Capillary: 121 mg/dL — ABNORMAL HIGH (ref 70–99)

## 2014-12-27 LAB — TROPONIN I
TROPONIN I: 0.08 ng/mL — AB (ref <0.031)
Troponin I: 0.07 ng/mL — ABNORMAL HIGH (ref <0.031)

## 2014-12-27 MED ORDER — LIDOCAINE HCL (PF) 1 % IJ SOLN
INTRAMUSCULAR | Status: AC
Start: 2014-12-27 — End: 2014-12-28
  Filled 2014-12-27: qty 10

## 2014-12-27 NOTE — Consult Note (Signed)
Met with the patient and wife at bedside to offer [place] Management services as benefit of insurance. Patient agreeable to services and will receive post hospital discharge call and will be evaluated for monthly home visits. Consent form signed and folder with [place] Management information given  Of note, [facility] Care Management services does not replace or interfere with any services that are arranged by inpatient case management or social work. For additional questions or referrals please contact, Natividad Brood, RN BSN CCM, [facility] Liaison at [phone].

## 2014-12-27 NOTE — Progress Notes (Signed)
Ref: [name],PHILIP H, MD   Subjective:  Feeling better. Leg edema is gone but ascites persist. Afebrile.  Objective:  Vital Signs in the last 24 hours: Temp:  [97.9 F (36.6 C)-98.4 F (36.9 C)] 98.2 F (36.8 C) (03/08 0552) Pulse Rate:  [70-78] 77 (03/08 0552) Cardiac Rhythm:  [-] Normal sinus rhythm (03/07 2057) Resp:  [18] 18 (03/08 0552) BP: (94-120)/(46-67) 112/67 mmHg (03/08 0552) SpO2:  [97 %-100 %] 99 % (03/08 0552) Weight:  [65.182 kg (143 lb 11.2 oz)-65.681 kg (144 lb 12.8 oz)] 65.182 kg (143 lb 11.2 oz) (03/08 0552)  Physical Exam: BP Readings from Last 1 Encounters:  12/27/14 112/67    Wt Readings from Last 1 Encounters:  12/27/14 65.182 kg (143 lb 11.2 oz)    Weight change:   HEENT: [place]/AT, Eyes-Brown, PERL, EOMI, Conjunctiva-Pink, Sclera-Non-icteric Neck: + JVD, No bruit, Trachea midline. Lungs:  Clear, Bilateral. Cardiac:  Regular rhythm, normal S1 and S2, no S3.  Abdomen:  Soft, distended but non-tender. Extremities:  No edema present. No cyanosis. No clubbing. CNS: AxOx3, Cranial nerves grossly intact, moves all 4 extremities. Right handed. Skin: Warm and dry.   Intake/Output from previous day: 03/07 0701 - 03/08 0700 In: 963 [P.O.:960; I.V.:3] Out: 2075 [Urine:2075]    Lab Results: BMET    Component Value Date/Time   NA 133* 12/27/2014 0449   NA 131* 12/26/2014 1745   NA 132* 10/19/2014 1133   K 3.3* 12/27/2014 0449   K 3.0* 12/26/2014 1745   K 3.6 10/19/2014 1133   CL 97 12/27/2014 0449   CL 95* 12/26/2014 1745   CL 92* 10/19/2014 1133   CO2 27 12/27/2014 0449   CO2 24 12/26/2014 1745   CO2 29 10/19/2014 1133   GLUCOSE 142* 12/27/2014 0449   GLUCOSE 262* 12/26/2014 1745   GLUCOSE 135* 10/19/2014 1133   BUN 70* 12/27/2014 0449   BUN 64* 12/26/2014 1745   BUN 54* 10/19/2014 1133   CREATININE 1.60* 12/27/2014 0449   CREATININE 1.58* 12/26/2014 1745   CREATININE 1.4 10/19/2014 1133   CALCIUM 9.0 12/27/2014 0449   CALCIUM 8.8  12/26/2014 1745   CALCIUM 9.7 10/19/2014 1133   GFRNONAA 41* 12/27/2014 0449   GFRNONAA 41* 12/26/2014 1745   GFRNONAA 68* 04/14/2013 0930   GFRAA 47* 12/27/2014 0449   GFRAA 48* 12/26/2014 1745   GFRAA 79* 04/14/2013 0930   CBC    Component Value Date/Time   WBC 4.9 12/26/2014 1745   RBC 4.32 12/26/2014 1745   HGB 12.9* 12/26/2014 1745   HCT 37.9* 12/26/2014 1745   PLT 120* 12/26/2014 1745   MCV 87.7 12/26/2014 1745   MCH 29.9 12/26/2014 1745   MCHC 34.0 12/26/2014 1745   RDW 15.7* 12/26/2014 1745   LYMPHSABS 0.7 12/26/2014 1745   MONOABS 0.4 12/26/2014 1745   EOSABS 0.2 12/26/2014 1745   BASOSABS 0.0 12/26/2014 1745   HEPATIC Function Panel  Recent Labs  12/26/14 1745  PROT 8.0   HEMOGLOBIN A1C No components found for: HGA1C,  MPG CARDIAC ENZYMES Lab Results  Component Value Date   CKTOTAL 72 11/01/2011   CKMB 3.0 11/01/2011   TROPONINI 0.08* 12/27/2014   TROPONINI 0.07* 12/26/2014   TROPONINI 0.05* 12/26/2014   BNP No results for input(s): PROBNP in the last 8760 hours. TSH No results for input(s): TSH in the last 8760 hours. CHOLESTEROL  Recent Labs  07/04/14 0957  CHOL 198    Scheduled Meds: . carvedilol  3.125 mg Oral BID WC  .  furosemide  80 mg Intravenous Q12H  . heparin  5,000 Units Subcutaneous 3 times per day  . insulin aspart  0-9 Units Subcutaneous TID WC  . insulin aspart protamine- aspart  10 Units Subcutaneous BID WC  . [START ON 12/28/2014] metolazone  2.5 mg Oral QODAY  . potassium chloride  20 mEq Oral QHS  . potassium chloride  40 mEq Oral Daily  . sodium chloride  3 mL Intravenous Q12H  . spironolactone  12.5 mg Oral Daily   Continuous Infusions:  PRN Meds:.sodium chloride, acetaminophen, ondansetron (ZOFRAN) IV, sodium chloride  Assessment/Plan: Acute on chronic left heart systolic failure.  CAD CABG Hypokalemia  DM, II  Dilated cardiomyopathy Ascites  US guided paracentesis     LOS: 1 day    Orpah Cobb  MD   12/27/2014, 8:13 AM

## 2014-12-27 NOTE — Plan of Care (Signed)
Problem: Phase II Progression Outcomes Goal: Walk in hall or up in chair TID Outcome: Progressing Patient walks from bed to the bathroom with standby assist

## 2014-12-27 NOTE — Care Management Note (Unsigned)
    Page 1 of 1   12/27/2014     2:43:53 PM CARE MANAGEMENT NOTE 12/27/2014  Patient:  [name],[name]   Account Number:  [id number]  Date Initiated:  12/27/2014  Documentation initiated by:  Briea Mcenery  Subjective/Objective Assessment:   Pt adm on 12/26/14 with CHF exacerbation.  PTA, pt resides at home with spouse.     Action/Plan:   Will follow for dc needs as pt progresses.   Anticipated DC Date:  12/30/2014   Anticipated DC Plan:  HOME W HOME HEALTH SERVICES      DC Planning Services  CM consult      Choice offered to / List presented to:             Status of service:  In process, will continue to follow Medicare Important Message given?   (If response is "NO", the following Medicare IM given date fields will be blank) Date Medicare IM given:   Medicare IM given by:   Date Additional Medicare IM given:   Additional Medicare IM given by:    Discharge Disposition:    Per UR Regulation:  Reviewed for med. necessity/level of care/duration of stay  If discussed at Long Length of Stay Meetings, dates discussed:    Comments:

## 2014-12-27 NOTE — Progress Notes (Signed)
  Echocardiogram 2D Echocardiogram has been performed.  [name] FRANCES 12/27/2014, 11:25 AM

## 2014-12-27 NOTE — Procedures (Signed)
Ultrasound-guided therapeutic paracentesis performed yielding 1.4 L of slightly turbid, amber fluid. No immediate complications.

## 2014-12-28 LAB — GLUCOSE, CAPILLARY
GLUCOSE-CAPILLARY: 97 mg/dL (ref 70–99)
GLUCOSE-CAPILLARY: 176 mg/dL — AB (ref 70–99)

## 2014-12-28 LAB — BASIC METABOLIC PANEL
Anion gap: 9 (ref 5–15)
BUN: 70 mg/dL — ABNORMAL HIGH (ref 6–23)
CO2: 29 mmol/L (ref 19–32)
Calcium: 9.1 mg/dL (ref 8.4–10.5)
Chloride: 96 mmol/L (ref 96–112)
Creatinine, Ser: 1.51 mg/dL — ABNORMAL HIGH (ref 0.50–1.35)
GFR calc Af Amer: 51 mL/min — ABNORMAL LOW (ref >90)
GFR, EST NON AFRICAN AMERICAN: 44 mL/min — AB (ref >90)
GLUCOSE: 89 mg/dL (ref 70–99)
Potassium: 3.7 mmol/L (ref 3.5–5.1)
Sodium: 134 mmol/L — ABNORMAL LOW (ref 135–145)

## 2014-12-28 LAB — HEMOGLOBIN A1C
Hgb A1c MFr Bld: 8.5 % — ABNORMAL HIGH (ref 4.8–5.6)
Mean Plasma Glucose: 197 mg/dL

## 2014-12-28 LAB — MAGNESIUM: Magnesium: 2.4 mg/dL (ref 1.5–2.5)

## 2014-12-28 MED ORDER — POTASSIUM CHLORIDE CRYS ER 10 MEQ PO TBCR
10.0000 meq | EXTENDED_RELEASE_TABLET | Freq: Once | ORAL | Status: AC
  Administered 2014-12-28: 10 meq via ORAL
  Filled 2014-12-28 (×2): qty 1

## 2014-12-28 MED ORDER — POTASSIUM CHLORIDE CRYS ER 20 MEQ PO TBCR: 20.0000 meq | EXTENDED_RELEASE_TABLET | Freq: Two times a day (BID) | ORAL | Status: DC

## 2014-12-28 NOTE — Progress Notes (Signed)
Pt complained of muscle cramp in right leg. One packet of mustard given to help with muscle cramps and was relieved very quickly within about 10-15 min or less. [facility] has no more packets of mustard and have called around for more mustard but to no avail. Awaiting page from the doctor

## 2014-12-28 NOTE — Progress Notes (Signed)
Pharmacist Heart Failure Core Measure Documentation  Assessment: [name] has an EF documented as 30% on 12/27/14 by echo.  Rationale: Heart failure patients with left ventricular systolic dysfunction (LVSD) and an EF < 40% should be prescribed an angiotensin converting enzyme inhibitor (ACEI) or angiotensin receptor blocker (ARB) at discharge unless a contraindication is documented in the medical record.  This patient is not currently on an ACEI or ARB for HF.  This note is being placed in the record in order to provide documentation that a contraindication to the use of these agents is present for this encounter.  ACE Inhibitor or Angiotensin Receptor Blocker is contraindicated (specify all that apply)  []   ACEI allergy AND ARB allergy []   Angioedema []   Moderate or severe aortic stenosis []   Hyperkalemia [x]   Hypotension []   Renal artery stenosis [x]   Worsening renal function, preexisting renal disease or dysfunction   Severiano GilbertWilson, Frank Rhea 12/28/2014 2:04 PM

## 2014-12-28 NOTE — Progress Notes (Signed)
Orders given for potassium and medication was given as ordered. Will continue to monitor patient to end of shift.

## 2014-12-28 NOTE — Discharge Summary (Signed)
Physician Discharge Summary  Patient ID: [name] MRN: [id number] DOB/AGE: [birth year] 74 y.o.  Admit date: 12/26/2014 Discharge date: 12/28/2014  Admission Diagnoses: Acute on chronic left heart systolic failure.  CAD CABG Hypokalemia  DM, II  Dilated cardiomyopathy Ascites  Discharge Diagnoses:  Principal Problem: * Acute on chronic bi-ventricular failure * Active Problems:   Ascites   DM II (diabetes mellitus, type II), controlled   Nonischemic dilated cardiomyopathy   Atrial fibrillation   Hyperlipidemia   Non-sustained VT   Protein calorie malnutrition   Hypokalemia   Severe Mitral valve regurgitation   Severe Tricuspid valve regurgitation   Discharged Condition: fair  Hospital Course: 74 years old male with known CAD, CABG, DM, II and CHF has leg edema, ascites and weight gain with dyspnea and without fever or cough. His BNP was elevated. He responded to IV lasix and had 1.5 litre of fluid removed by paracentesis. He tolerated procedure well and was discharged home in stable condition.  Consults: cardiology  Significant Diagnostic Studies: labs: Grossly unremarkable. Hypokalemia improved with extra Potassium intake.  Echocardiogram showed dilated LV with 35 % EF and severe MR and TR. EKG-Atrial fibrillation with LVH and frequent Premature VCs.  Treatments: cardiac meds: furosemide  Discharge Exam: Blood pressure 125/70, pulse 74, temperature 97.8 F (36.6 C), temperature source Oral, resp. rate 18, height  (1.651 m), weight 63.594 kg (140 lb 3.2 oz), SpO2 100 %. HEENT: [place]/AT, Eyes-Brown, PERL, EOMI, Conjunctiva-Pink, Sclera-Non-icteric Neck: + JVD, No bruit, Trachea midline. Lungs: Clear, Bilateral. Cardiac: Regular rhythm, normal S1 and S2, no S3.  Abdomen: Soft, distended but non-tender. Extremities: No edema present. No cyanosis. No clubbing. CNS: AxOx3, Cranial nerves grossly intact, moves all 4 extremities. Right handed. Skin: Warm and  dry.  Disposition: 01-Home or Self Care     Medication List    STOP taking these medications        meloxicam 15 MG tablet  Commonly known as:  MOBIC      TAKE these medications        carvedilol 3.125 MG tablet  Commonly known as:  COREG  Take 1 tablet (3.125 mg total) by mouth 2 (two) times daily with a meal.     cetirizine 10 MG tablet  Commonly known as:  ZYRTEC  1 tab po qd prn itchy rash     furosemide 80 MG tablet  Commonly known as:  LASIX  Take 80 mg by mouth 3 (three) times daily.     HUMULIN 70/30 (70-30) 100 UNIT/ML injection  Generic drug:  insulin NPH-regular Human  Inject 10-35 Units into the skin daily with supper.     metolazone 2.5 MG tablet  Commonly known as:  ZAROXOLYN  Take 1 tablet (2.5 mg total) by mouth every other day.     miconazole 2 % powder  Commonly known as:  MICOTIN  Apply 1 application topically 3 (three) times daily as needed for itching (rash).     OVER THE COUNTER MEDICATION  Take 2 capsules by mouth every morning. Ultra guard forte.  Multi vitamin in food form     potassium chloride SA 20 MEQ tablet  Commonly known as:  K-DUR,KLOR-CON  Take 1-2 tablets (20-40 mEq total) by mouth 2 (two) times daily. 2 tabs in the morning and 1 tab in the evening     spironolactone 12.5 mg Tabs tablet  Commonly known as:  ALDACTONE  Take 0.5 tablets (12.5 mg total) by mouth daily.  Follow-up Information    Follow up with MCGOWEN,PHILIP H, MD In 1 month.   Specialty:  Family Medicine   Contact information:   [address] [place] [address] [phone]       [name], MD. Schedule an appointment as soon as possible for a visit in 2 weeks.   Specialty:  Cardiology   Contact information:   [address] [place] [address] [phone]       Signed: Ricki RodriguezKADAKIA,[name] 12/28/2014, 1:11 PM

## 2014-12-28 NOTE — Progress Notes (Signed)
Patient had 6 beats of Vtach. Notified Dr. Algie CofferKadakia. Orders given to continue to monitor patient and will round on patient in the morning. Currently patient is a the sink washing his face and states he has had no chest pain or pressue. Will continue to monitor patient to end of shift.

## 2015-01-09 ENCOUNTER — Other Ambulatory Visit: Payer: PPO | Admitting: *Deleted

## 2015-01-12 NOTE — Patient Outreach (Signed)
   01/12/2015   Francisco RidgeRaymond Mueller [birth year] [id number]   RN attempted to contact pt today with ongoing transition of care contact week three from his discharge date on 12/22/2014. Unsuccessful as RN able to leave a generic voice message requesting a call back. Note RN actually involved with the initial home visit completed on 01/01/2025 via legacy documentation system.  Elliot CousinLisa Emerlyn Mehlhoff, RN Care Management Coordinator [facility] Main Office [phone]

## 2015-01-19 ENCOUNTER — Ambulatory Visit (INDEPENDENT_AMBULATORY_CARE_PROVIDER_SITE_OTHER): Payer: PPO | Admitting: Family Medicine

## 2015-01-19 ENCOUNTER — Encounter: Payer: Self-pay | Admitting: Family Medicine

## 2015-01-19 VITALS — BP 118/74 | HR 67 | Temp 97.4°F | Ht 65.0 in | Wt 150.0 lb

## 2015-01-19 DIAGNOSIS — I5082 Biventricular heart failure: Secondary | ICD-10-CM

## 2015-01-19 DIAGNOSIS — N189 Chronic kidney disease, unspecified: Secondary | ICD-10-CM

## 2015-01-19 DIAGNOSIS — I482 Chronic atrial fibrillation, unspecified: Secondary | ICD-10-CM

## 2015-01-19 DIAGNOSIS — Z9119 Patient's noncompliance with other medical treatment and regimen: Secondary | ICD-10-CM

## 2015-01-19 DIAGNOSIS — I509 Heart failure, unspecified: Secondary | ICD-10-CM

## 2015-01-19 DIAGNOSIS — E118 Type 2 diabetes mellitus with unspecified complications: Secondary | ICD-10-CM | POA: Diagnosis not present

## 2015-01-19 DIAGNOSIS — Z91199 Patient's noncompliance with other medical treatment and regimen due to unspecified reason: Secondary | ICD-10-CM

## 2015-01-19 DIAGNOSIS — N183 Chronic kidney disease, stage 3 unspecified: Secondary | ICD-10-CM

## 2015-01-19 MED ORDER — INSULIN NPH ISOPHANE & REGULAR (70-30) 100 UNIT/ML ~~LOC~~ SUSP: 10.0000 [IU] | Freq: Every day | SUBCUTANEOUS | Status: DC

## 2015-01-19 MED ORDER — INSULIN NPH ISOPHANE & REGULAR (70-30) 100 UNIT/ML ~~LOC~~ SUSP: SUBCUTANEOUS | Status: DC

## 2015-01-19 NOTE — Progress Notes (Signed)
OFFICE VISIT  01/28/2015   CC:  Chief Complaint  Patient presents with  . Follow-up     HPI:    Patient is a 74 y.o. Caucasian male who presents for 4 mo f/u DM 2, CRI, and nonischemic CM. Has hyperlipidemia but has long refused statin meds. 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 Repeat echo: unchanged (see PMH section). Still in a-fib: still not anticoag candidate. Appears to have worsened CRI, new baseline CrCl in the 50 range. No new meds started in hosp.  It is unclear whether or not he has had hosp f/u with Dr. Algie Coffer yet or not.  Says next f/u with him is 02/14/15.  Last f/u with me I d/c'd his sulfonylurea pill and increased his 70/30 regimen to 20 U qAM and 10 U q supper. Fasting gluc: 60-158.  Hypogl has not been occuring with any regularity. Not checking gluc much later in day. Pt also not consistently following insulin dosing instructions, often LOWERS pre breakfast dose if gluc is 150 for instance.  Has long been a "self doser" regardless of what MD's instruct him to do.  ROS: no recent sob, cp, dizziness, or generalized weakness.   Past Medical History  Diagnosis Date  . Atrial fibrillation     ?need old records: ? GI bleed in past, plus pt tends to "self adjust/manage" his meds w/out regard for MD's guidance, so he has NOT been on anticoagulant for this.  . Coronary artery disease   . Hypertension   . CHF (congestive heart failure)   . High cholesterol   . Pulmonary embolism 1980's or 1990's  . Pneumonia   . 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 Gout   . Chronic renal insufficiency, stage III (moderate)   . Heart murmur   . Chronic bronchitis     "used to get it often; haven't had it for awhile now" (04/12/2013)  . Type II diabetes mellitus   . Postpolio syndrome 1951    "affected the right side of my body; been weak on that side ever since"  (04/12/2013)  . Severe mitral regurgitation 2013  . Severe tricuspid regurgitation 2013  . 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 Dione Booze)    Past Surgical History  Procedure Laterality Date  . Retinal laser procedure Bilateral     "more than once on the left" (04/12/2013)  . Cataract extraction w/ intraocular lens  implant, bilateral Bilateral 2005  . Appendectomy  ~ 1962  . Cardiac catheterization      "I've had a few since 1989" (04/12/2013)  . Cervical discectomy  1984    C6-7  . 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 Exploratory laparotomy  ~ 1962    "took out my appendix while there; bowel was inflammed" (04/12/2013)  . Hydrocele excision / repair      "couple times" (04/12/2013)  . Transthoracic echocardiogram  2013;12/2014    LV dilation, EF 30-35%, diffuse hypokinesis, grade I diast dysfxn, severe mitral and tricuspid regurg, LAE and RAE--biventricular dysfunction moderate/severe  . Paracentesis  2013-2014    Multiple times, usually pulling off 6-7 liters of fluid  . Cardiac catheterization  12/2012    Severe native CAD, patent bypass grafts x 3, EF 25-30%--med mgmt  .  Left and right heart catheterization with coronary/graft angiogram  01/13/2013    Procedure: LEFT AND RIGHT HEART CATHETERIZATION WITH Isabel Caprice;  Surgeon: [name], MD;  Location: [facility]ATH [facility];  Service: Cardiovascular;;    Outpatient Prescriptions Prior to Visit  Medication Sig Dispense Refill  . carvedilol (COREG) 3.125 MG tablet Take 1 tablet (3.125 mg total) by mouth 2 (two) times daily with a meal.    . cetirizine (ZYRTEC) 10 MG tablet 1 tab po qd prn itchy rash 30 tablet 3  . furosemide (LASIX) 80 MG tablet Take 80 mg by mouth 3 (three) times daily.    . metolazone (ZAROXOLYN) 2.5 MG tablet Take 1 tablet (2.5 mg total) by mouth every other day. 15 tablet 1  . miconazole (MICOTIN) 2 % powder Apply 1 application  topically 3 (three) times daily as needed for itching (rash).     Marland Kitchen OVER THE COUNTER MEDICATION Take 2 capsules by mouth every morning. Ultra guard forte.  Multi vitamin in food form    . potassium chloride SA (K-DUR,KLOR-CON) 20 MEQ tablet Take 1-2 tablets (20-40 mEq total) by mouth 2 (two) times daily. 2 tabs in the morning and 1 tab in the evening    . spironolactone (ALDACTONE) 12.5 mg TABS Take 0.5 tablets (12.5 mg total) by mouth daily. 15 tablet 1  . insulin NPH-insulin regular (HUMULIN 70/30) (70-30) 100 UNIT/ML injection Inject 10-35 Units into the skin daily with supper.      No facility-administered medications prior to visit.    Allergies  Allergen Reactions  . Sulfa Antibiotics Rash    ROS As per HPI  PE: Blood pressure 118/74, pulse 67, temperature 97.4 F (36.3 C), temperature source Oral, height  (1.651 m), weight 150 lb (68.04 kg), SpO2 99 %. Gen: Alert, well appearing.  Patient is oriented to person, place, time, and situation. EXT: 3+ L ankle pitting edema, 2+R ankle pitting edema No abd distention or tenderness.  LABS:  Lab Results  Component Value Date   HGBA1C 8.5* 12/26/2014     Chemistry      Component Value Date/Time   NA 134* 12/28/2014 0505   K 3.7 12/28/2014 0505   CL 96 12/28/2014 0505   CO2 29 12/28/2014 0505   BUN 70* 12/28/2014 0505   CREATININE 1.51* 12/28/2014 0505      Component Value Date/Time   CALCIUM 9.1 12/28/2014 0505   ALKPHOS 92 12/26/2014 1745   AST 30 12/26/2014 1745   ALT 14 12/26/2014 1745   BILITOT 1.3* 12/26/2014 1745     Lab Results  Component Value Date   CHOL 198 07/04/2014   HDL 40.40 07/04/2014   LDLCALC 139* 07/04/2014   TRIG 95.0 07/04/2014   CHOLHDL 5 07/04/2014   Lab Results  Component Value Date   WBC 4.9 12/26/2014   HGB 12.9* 12/26/2014   HCT 37.9* 12/26/2014   MCV 87.7 12/26/2014   PLT 120* 12/26/2014     IMPRESSION AND PLAN:  1) DM 2, hx of complications (renal and retinal): last A1c  8.5% a few weeks ago. Noncompliant with insulin dosing historically, pt is a "self doser" for the most part. Tried to explain some of the basics of insulin dosing so he would make better choices about this in the future. Increase 70/30 insulin to 25 U qAM and 15 U qSupper. Diabetic monitoring currently UTD.  2) CRI: new baseline CrCl last few months (now mid 64s).  No repeat labs due today.  3) Cardiac: A-fib, chronic biventricular failure (recent acute exacerbation), pt not coumadin candidate.  Echo earlier this month showed no change compared to 2013. Continue beta blocker, ASA, aldactone, diuretics, sodium restriction.  Continue f/u with Dr. Algie CofferKadakia.  An After Visit Summary was printed and given to the patient.  FOLLOW UP: Return in about 4 months (around 05/21/2015) for routine chronic illness f/u.

## 2015-01-19 NOTE — Progress Notes (Signed)
Pre visit review using our clinic review tool, if applicable. No additional management support is needed unless otherwise documented below in the visit note. 

## 2015-01-23 ENCOUNTER — Other Ambulatory Visit: Payer: Self-pay | Admitting: *Deleted

## 2015-01-23 NOTE — Patient Outreach (Addendum)
[facility] Care Management  01/19/2015  [name] 1941-01-22 [id number]   RE: Third Transition of care contact  RN spoke with pt today who indicates she continues to do well with no major issues. Reports he continues to have a swollen abdomen (doctor is aware). Pt also states little swelling ot both legs but he is trying to wear his compression stockings. Reports a doctor's visit today and he did not wear his stockings allowing his doctor to view his his legs.  Pt states his report was "good".  No other inquires or questions at this time. RN will follow up next month.  Elliot CousinLisa Valdez Brannan, RN Care Management Coordinator [facility] Main Office [phone]

## 2015-01-26 ENCOUNTER — Other Ambulatory Visit: Payer: Self-pay | Admitting: *Deleted

## 2015-01-28 DIAGNOSIS — I5082 Biventricular heart failure: Secondary | ICD-10-CM | POA: Insufficient documentation

## 2015-01-28 DIAGNOSIS — N183 Chronic kidney disease, stage 3 unspecified: Secondary | ICD-10-CM | POA: Insufficient documentation

## 2015-01-28 DIAGNOSIS — E118 Type 2 diabetes mellitus with unspecified complications: Secondary | ICD-10-CM | POA: Insufficient documentation

## 2015-02-02 ENCOUNTER — Other Ambulatory Visit: Payer: Self-pay | Admitting: *Deleted

## 2015-02-02 NOTE — Patient Outreach (Signed)
[facility] Care Management   02/02/2015  Francisco RidgeRaymond Laitinen 05-Feb-1941 [id number]  [name] is an 74 y.o. male  Subjective:  HF: Pt reports he continues to weight daily and document all weights in his calendar with no problems. Pt reports he is in the GREEN zone and with no signs or symptoms of HF. Pt able to recite a few signs and symptoms and what to do if acute with any HF symptoms. MEDICATION: Pt reports he continues to take the higher dose of her Aldactone and obtains his BP prior to taking his medications.  It was recommend pt take the higher dose of 25 mg if his BP was greater then 90/60. Pt verified his daily dose has been over 90/60 when administering the higher dosage.  EDEMA: Pt with some swelling and compressions stocking has been discussed on the last home visit.  States he does not have his TEDs on today but his wife has been helping to apply them when needed.  Objective:   Review of Systems  All other systems reviewed and are negative.   Physical Exam  Constitutional: He is oriented to person, place, and time. He appears well-developed and well-nourished.  HENT:  Right Ear: External ear normal.  Left Ear: External ear normal.  Nose: Nose normal.  Neck: Normal range of motion.  Cardiovascular: Normal rate, regular rhythm and normal heart sounds.   Respiratory: Effort normal and breath sounds normal.  GI: Soft. Bowel sounds are normal.  Musculoskeletal: Normal range of motion.  Neurological: He is alert and oriented to person, place, and time.  Skin: Skin is warm and dry.  Psychiatric: He has a normal mood and affect. His behavior is normal. Judgment and thought content normal.    Current Medications:   Current Outpatient Prescriptions  Medication Sig Dispense Refill  . carvedilol (COREG) 3.125 MG tablet Take 1 tablet (3.125 mg total) by mouth 2 (two) times daily with a meal.    . furosemide (LASIX) 80 MG tablet Take 80 mg by mouth 3 (three)  times daily.    . insulin NPH-regular Human (HUMULIN 70/30) (70-30) 100 UNIT/ML injection 25 U SQ at breakfast and 15 U SQ at supper 10 mL 6  . metolazone (ZAROXOLYN) 2.5 MG tablet Take 1 tablet (2.5 mg total) by mouth every other day. 15 tablet 1  . miconazole (MICOTIN) 2 % powder Apply 1 application topically 3 (three) times daily as needed for itching (rash).     Marland Kitchen. OVER THE COUNTER MEDICATION Take 2 capsules by mouth every morning. Ultra guard forte.  Multi vitamin in food form    . potassium chloride SA (K-DUR,KLOR-CON) 20 MEQ tablet Take 1-2 tablets (20-40 mEq total) by mouth 2 (two) times daily. 2 tabs in the morning and 1 tab in the evening    . spironolactone (ALDACTONE) 12.5 mg TABS Take 0.5 tablets (12.5 mg total) by mouth daily. (Patient taking differently: Take 12.5 mg by mouth daily. Pt has instructions to take the 25 mg if his BP >90/60.) 15 tablet 1  . aspirin 81 MG tablet Take 81 mg by mouth daily.    . cetirizine (ZYRTEC) 10 MG tablet 1 tab po qd prn itchy rash (Patient not taking: Reported on 02/02/2015) 30 tablet 3   No current facility-administered medications for this visit.    Functional Status:   In your present state of health, do you have any difficulty performing the following activities: 02/02/2015 12/26/2014  Hearing? N N  Vision? N  N  Difficulty concentrating or making decisions? N N  Walking or climbing stairs? N Y  Dressing or bathing? N N  Doing errands, shopping? N N  Preparing Food and eating ? N -  Using the Toilet? N -  In the past six months, have you accidently leaked urine? N -  Do you have problems with loss of bowel control? N -  Managing your Medications? N -  Managing your Finances? N -  Housekeeping or managing your Housekeeping? N -    Fall/Depression Screening:    PHQ 2/9 Scores 02/02/2015  PHQ - 2 Score 0    Assessment:   Ongoing Case management related to HF Follow up on medication (Aldactone) Follow up ongoing swelling related  adherence with TED and leg elevation  Plan:  Physical assessment completed with no acute issues noted today. Will confirm pt remains in the GREEN zone with no signs or symptoms encountered. Will also engage in teach-back method on pt's knowledge base on what was covered on the last home visit with provided print outs on Emmi educational information. Pt with good awareness and continues to self manage his care well however needed ongoing review of involved symptom related to HF. Will verified pt continues to follow doctor's recommendations of administering Aldactone for his daily medications.  Will continue to encourage daily use of compression stockings and continue to elevate his legs to reduce ongoing swelling. Verified pt's awareness of what to do if acute symptoms are encountered by contacting his provider for interventions. Plan to follow up in one month with ongoing case management services and continue to provide needed resources and referrals as needed related to his ongoing medical issues. Will schedule home visit for next month accordingly.   Elliot Cousin, RN Care Management Coordinator [facility] Main Office [phone]

## 2015-02-08 ENCOUNTER — Telehealth: Payer: Self-pay | Admitting: Family Medicine

## 2015-02-08 MED ORDER — GLUCOSE BLOOD VI STRP: ORAL_STRIP | Status: DC

## 2015-02-08 MED ORDER — ONETOUCH ULTRA SYSTEM W/DEVICE KIT: 1.0000 | PACK | Freq: Once | Status: DC

## 2015-02-08 NOTE — Telephone Encounter (Signed)
Please call in rx's for the following diabetes supplies [facility] @ [phone]  1. One touch glucose meter 2. Test strips 3. Lancet divise 4. Lancets 5. Control solution 6. Reli syringes    3cc, 31 gage, 8mm length

## 2015-02-08 NOTE — Telephone Encounter (Signed)
Rxs sent

## 2015-02-10 ENCOUNTER — Inpatient Hospital Stay (HOSPITAL_COMMUNITY)
Admission: AD | Admit: 2015-02-10 | Discharge: 2015-02-14 | DRG: 292 | Disposition: A | Discharge status: Home / Self Care | Payer: PPO | Source: Ambulatory Visit | Attending: Cardiovascular Disease | Admitting: Cardiovascular Disease

## 2015-02-10 ENCOUNTER — Encounter (HOSPITAL_COMMUNITY): Payer: Self-pay

## 2015-02-10 DIAGNOSIS — E780 Pure hypercholesterolemia: Secondary | ICD-10-CM | POA: Diagnosis present

## 2015-02-10 DIAGNOSIS — I4891 Unspecified atrial fibrillation: Secondary | ICD-10-CM | POA: Diagnosis present

## 2015-02-10 DIAGNOSIS — N183 Chronic kidney disease, stage 3 unspecified: Secondary | ICD-10-CM | POA: Diagnosis present

## 2015-02-10 DIAGNOSIS — Z87891 Personal history of nicotine dependence: Secondary | ICD-10-CM | POA: Diagnosis not present

## 2015-02-10 DIAGNOSIS — R0602 Shortness of breath: Secondary | ICD-10-CM | POA: Diagnosis present

## 2015-02-10 DIAGNOSIS — M109 Gout, unspecified: Secondary | ICD-10-CM | POA: Diagnosis present

## 2015-02-10 DIAGNOSIS — I509 Heart failure, unspecified: Secondary | ICD-10-CM

## 2015-02-10 DIAGNOSIS — E11359 Type 2 diabetes mellitus with proliferative diabetic retinopathy without macular edema: Secondary | ICD-10-CM | POA: Diagnosis present

## 2015-02-10 DIAGNOSIS — N2889 Other specified disorders of kidney and ureter: Secondary | ICD-10-CM | POA: Diagnosis present

## 2015-02-10 DIAGNOSIS — Z882 Allergy status to sulfonamides status: Secondary | ICD-10-CM

## 2015-02-10 DIAGNOSIS — Z7982 Long term (current) use of aspirin: Secondary | ICD-10-CM | POA: Diagnosis not present

## 2015-02-10 DIAGNOSIS — R188 Other ascites: Secondary | ICD-10-CM | POA: Diagnosis present

## 2015-02-10 DIAGNOSIS — Z9841 Cataract extraction status, right eye: Secondary | ICD-10-CM

## 2015-02-10 DIAGNOSIS — Z961 Presence of intraocular lens: Secondary | ICD-10-CM | POA: Diagnosis present

## 2015-02-10 DIAGNOSIS — E118 Type 2 diabetes mellitus with unspecified complications: Secondary | ICD-10-CM | POA: Diagnosis present

## 2015-02-10 DIAGNOSIS — Z951 Presence of aortocoronary bypass graft: Secondary | ICD-10-CM | POA: Diagnosis not present

## 2015-02-10 DIAGNOSIS — I5023 Acute on chronic systolic (congestive) heart failure: Secondary | ICD-10-CM | POA: Diagnosis present

## 2015-02-10 DIAGNOSIS — I420 Dilated cardiomyopathy: Secondary | ICD-10-CM

## 2015-02-10 DIAGNOSIS — I2510 Atherosclerotic heart disease of native coronary artery without angina pectoris: Secondary | ICD-10-CM | POA: Diagnosis present

## 2015-02-10 DIAGNOSIS — Z9842 Cataract extraction status, left eye: Secondary | ICD-10-CM

## 2015-02-10 DIAGNOSIS — I129 Hypertensive chronic kidney disease with stage 1 through stage 4 chronic kidney disease, or unspecified chronic kidney disease: Secondary | ICD-10-CM | POA: Diagnosis present

## 2015-02-10 LAB — COMPREHENSIVE METABOLIC PANEL
ALT: 28 U/L (ref 0–53)
AST: 52 U/L — ABNORMAL HIGH (ref 0–37)
Albumin: 3.2 g/dL — ABNORMAL LOW (ref 3.5–5.2)
Alkaline Phosphatase: 166 U/L — ABNORMAL HIGH (ref 39–117)
Anion gap: 13 (ref 5–15)
BUN: 72 mg/dL — ABNORMAL HIGH (ref 6–23)
CALCIUM: 8.9 mg/dL (ref 8.4–10.5)
CO2: 21 mmol/L (ref 19–32)
CREATININE: 1.92 mg/dL — AB (ref 0.50–1.35)
Chloride: 94 mmol/L — ABNORMAL LOW (ref 96–112)
GFR calc Af Amer: 38 mL/min — ABNORMAL LOW (ref >90)
GFR, EST NON AFRICAN AMERICAN: 33 mL/min — AB (ref >90)
Glucose, Bld: 175 mg/dL — ABNORMAL HIGH (ref 70–99)
Potassium: 3.7 mmol/L (ref 3.5–5.1)
Sodium: 128 mmol/L — ABNORMAL LOW (ref 135–145)
Total Bilirubin: 3.1 mg/dL — ABNORMAL HIGH (ref 0.3–1.2)
Total Protein: 7.7 g/dL (ref 6.0–8.3)

## 2015-02-10 LAB — CBC WITH DIFFERENTIAL/PLATELET
Basophils Absolute: 0 10*3/uL (ref 0.0–0.1)
Basophils Relative: 0 % (ref 0–1)
EOS ABS: 0 10*3/uL (ref 0.0–0.7)
Eosinophils Relative: 0 % (ref 0–5)
HCT: 38.7 % — ABNORMAL LOW (ref 39.0–52.0)
Hemoglobin: 13.3 g/dL (ref 13.0–17.0)
LYMPHS ABS: 0.7 10*3/uL (ref 0.7–4.0)
LYMPHS PCT: 6 % — AB (ref 12–46)
MCH: 30 pg (ref 26.0–34.0)
MCHC: 34.4 g/dL (ref 30.0–36.0)
MCV: 87.4 fL (ref 78.0–100.0)
Monocytes Absolute: 0.9 10*3/uL (ref 0.1–1.0)
Monocytes Relative: 7 % (ref 3–12)
NEUTROS PCT: 87 % — AB (ref 43–77)
Neutro Abs: 11.5 10*3/uL — ABNORMAL HIGH (ref 1.7–7.7)
PLATELETS: 121 10*3/uL — AB (ref 150–400)
RBC: 4.43 MIL/uL (ref 4.22–5.81)
RDW: 14.9 % (ref 11.5–15.5)
WBC: 13.2 10*3/uL — AB (ref 4.0–10.5)

## 2015-02-10 LAB — GLUCOSE, CAPILLARY
GLUCOSE-CAPILLARY: 198 mg/dL — AB (ref 70–99)
Glucose-Capillary: 201 mg/dL — ABNORMAL HIGH (ref 70–99)

## 2015-02-10 LAB — TROPONIN I
Troponin I: 0.22 ng/mL — ABNORMAL HIGH (ref <0.031)
Troponin I: 0.46 ng/mL — ABNORMAL HIGH (ref <0.031)

## 2015-02-10 LAB — BRAIN NATRIURETIC PEPTIDE: B NATRIURETIC PEPTIDE 5: 1735.6 pg/mL — AB (ref 0.0–100.0)

## 2015-02-10 MED ORDER — CARVEDILOL 3.125 MG PO TABS
3.1250 mg | ORAL_TABLET | Freq: Two times a day (BID) | ORAL | Status: DC
  Administered 2015-02-10 – 2015-02-14 (×8): 3.125 mg via ORAL
  Filled 2015-02-10 (×10): qty 1

## 2015-02-10 MED ORDER — ASPIRIN EC 81 MG PO TBEC
81.0000 mg | DELAYED_RELEASE_TABLET | Freq: Every day | ORAL | Status: DC
  Administered 2015-02-11 – 2015-02-14 (×4): 81 mg via ORAL
  Filled 2015-02-10 (×4): qty 1

## 2015-02-10 MED ORDER — POTASSIUM CHLORIDE CRYS ER 20 MEQ PO TBCR: 20.0000 meq | EXTENDED_RELEASE_TABLET | Freq: Two times a day (BID) | ORAL | Status: DC

## 2015-02-10 MED ORDER — ACETAMINOPHEN 325 MG PO TABS: 650.0000 mg | ORAL_TABLET | ORAL | Status: DC | PRN

## 2015-02-10 MED ORDER — INSULIN ASPART 100 UNIT/ML ~~LOC~~ SOLN
0.0000 [IU] | Freq: Three times a day (TID) | SUBCUTANEOUS | Status: DC
  Administered 2015-02-10 – 2015-02-11 (×4): 2 [IU] via SUBCUTANEOUS
  Administered 2015-02-12: 1 [IU] via SUBCUTANEOUS
  Administered 2015-02-12: 7 [IU] via SUBCUTANEOUS
  Administered 2015-02-12: 5 [IU] via SUBCUTANEOUS
  Administered 2015-02-13: 3 [IU] via SUBCUTANEOUS
  Administered 2015-02-13 – 2015-02-14 (×3): 2 [IU] via SUBCUTANEOUS
  Administered 2015-02-14: 3 [IU] via SUBCUTANEOUS

## 2015-02-10 MED ORDER — SODIUM CHLORIDE 0.9 % IJ SOLN
3.0000 mL | Freq: Two times a day (BID) | INTRAMUSCULAR | Status: DC
  Administered 2015-02-10 – 2015-02-12 (×5): 3 mL via INTRAVENOUS

## 2015-02-10 MED ORDER — FUROSEMIDE 10 MG/ML IJ SOLN
40.0000 mg | Freq: Two times a day (BID) | INTRAMUSCULAR | Status: DC
  Administered 2015-02-10 – 2015-02-14 (×7): 40 mg via INTRAVENOUS
  Filled 2015-02-10 (×10): qty 4

## 2015-02-10 MED ORDER — SODIUM CHLORIDE 0.9 % IJ SOLN: 3.0000 mL | INTRAMUSCULAR | Status: DC | PRN

## 2015-02-10 MED ORDER — HEPARIN SODIUM (PORCINE) 5000 UNIT/ML IJ SOLN
5000.0000 [IU] | Freq: Three times a day (TID) | INTRAMUSCULAR | Status: DC
  Administered 2015-02-10 – 2015-02-14 (×6): 5000 [IU] via SUBCUTANEOUS
  Filled 2015-02-10 (×13): qty 1

## 2015-02-10 MED ORDER — ONDANSETRON HCL 4 MG/2ML IJ SOLN
4.0000 mg | Freq: Four times a day (QID) | INTRAMUSCULAR | Status: DC | PRN
Start: 2015-02-10 — End: 2015-02-14

## 2015-02-10 MED ORDER — POTASSIUM CHLORIDE CRYS ER 20 MEQ PO TBCR
40.0000 meq | EXTENDED_RELEASE_TABLET | Freq: Every morning | ORAL | Status: DC
  Administered 2015-02-11 – 2015-02-14 (×4): 40 meq via ORAL
  Filled 2015-02-10 (×5): qty 2

## 2015-02-10 MED ORDER — LORATADINE 10 MG PO TABS
10.0000 mg | ORAL_TABLET | Freq: Every day | ORAL | Status: DC
  Administered 2015-02-12 – 2015-02-14 (×3): 10 mg via ORAL
  Filled 2015-02-10 (×4): qty 1

## 2015-02-10 MED ORDER — SPIRONOLACTONE 12.5 MG HALF TABLET
12.5000 mg | ORAL_TABLET | Freq: Every day | ORAL | Status: DC
  Administered 2015-02-11 – 2015-02-14 (×4): 12.5 mg via ORAL
  Filled 2015-02-10 (×5): qty 1

## 2015-02-10 MED ORDER — SODIUM CHLORIDE 0.9 % IV SOLN: 250.0000 mL | INTRAVENOUS | Status: DC | PRN

## 2015-02-10 NOTE — H&P (Signed)
Referring Physician:  [name] is an 74 y.o. male.                       Chief Complaint: Shortness of breath  HPI: 74 year old male with known CAD, CABG, Type II DM and CHF has 5-6 pound weight gain with ascites and shortness of breath without leg edema. No fever or cough.  Past Medical History  Diagnosis Date  . Atrial fibrillation     ?need old records: ? GI bleed in past, plus pt tends to "self adjust/manage" his meds w/out regard for MD's guidance, so he has NOT been on anticoagulant for this.  . Coronary artery disease   . Hypertension   . CHF (congestive heart failure)   . High cholesterol   . Pulmonary embolism 1980's or 1990's  . Pneumonia   . 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 Gout   . Chronic renal insufficiency, stage III (moderate)   . Heart murmur   . Chronic bronchitis     "used to get it often; haven't had it for awhile now" (04/12/2013)  . Type II diabetes mellitus   . Postpolio syndrome 1951    "affected the right side of my body; been weak on that side ever since" (04/12/2013)  . Severe mitral regurgitation 2013  . Severe tricuspid regurgitation 2013  . 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 Katy Mueller)      Past Surgical History  Procedure Laterality Date  . Retinal laser procedure Bilateral     "more than once on the left" (04/12/2013)  . Cataract extraction w/ intraocular lens  implant, bilateral Bilateral 2005  . Appendectomy  ~ 1962  . Cardiac catheterization      "I've had a few since 1989" (04/12/2013)  . Cervical discectomy  1984    C6-7  . 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 Exploratory laparotomy  ~ 1962    "took out my appendix while there; bowel was inflammed" (04/12/2013)  . Hydrocele excision / repair      "couple times" (04/12/2013)  . Transthoracic echocardiogram  2013;12/2014    LV dilation, EF 30-35%, diffuse hypokinesis, grade I diast dysfxn,  severe mitral and tricuspid regurg, LAE and RAE--biventricular dysfunction moderate/severe  . Paracentesis  2013-2014    Multiple times, usually pulling off 6-7 liters of fluid  . Cardiac catheterization  12/2012    Severe native CAD, patent bypass grafts x 3, EF 25-30%--med mgmt  . Left and right heart catheterization with coronary/graft angiogram  01/13/2013    Procedure: LEFT AND RIGHT HEART CATHETERIZATION WITH [name];  Surgeon: Francisco Riddle, MD;  Location: [facility];  Service: Cardiovascular;;    Family History  Problem Relation Age of Onset  . Cancer Sister   . Parkinson's disease Brother    Social History:  reports that he quit smoking about 41 years ago. His smoking use included Cigarettes. He has a 5 pack-year smoking history. He quit smokeless tobacco use about 45 years ago. His smokeless tobacco use included Chew. He reports that he drinks alcohol. He reports that he does not use illicit drugs.  Allergies:  Allergies  Allergen Reactions  . Sulfa Antibiotics Rash    Medications Prior to Admission  Medication Sig Dispense Refill  . aspirin 81 MG tablet Take 81 mg  by mouth daily.    . Blood Glucose Monitoring Suppl (ONE TOUCH ULTRA SYSTEM KIT) W/DEVICE KIT 1 kit by Does not apply route once. 1 each 0  . carvedilol (COREG) 3.125 MG tablet Take 1 tablet (3.125 mg total) by mouth 2 (two) times daily with a meal.    . cetirizine (ZYRTEC) 10 MG tablet 1 tab po qd prn itchy rash (Patient not taking: Reported on 02/02/2015) 30 tablet 3  . furosemide (LASIX) 80 MG tablet Take 80 mg by mouth 3 (three) times daily.    Marland Kitchen glucose blood test strip Please check glucose BID, PRN or as directed. 100 each 3  . insulin NPH-regular Human (HUMULIN 70/30) (70-30) 100 UNIT/ML injection 25 U SQ at breakfast and 15 U SQ at supper 10 mL 6  . metolazone (ZAROXOLYN) 2.5 MG tablet Take 1 tablet (2.5 mg total) by mouth every other day. 15 tablet 1  . miconazole (MICOTIN) 2 % powder  Apply 1 application topically 3 (three) times daily as needed for itching (rash).     Marland Kitchen OVER THE COUNTER MEDICATION Take 2 capsules by mouth every morning. Ultra guard forte.  Multi vitamin in food form    . potassium chloride SA (K-DUR,KLOR-CON) 20 MEQ tablet Take 1-2 tablets (20-40 mEq total) by mouth 2 (two) times daily. 2 tabs in the morning and 1 tab in the evening    . spironolactone (ALDACTONE) 12.5 mg TABS Take 0.5 tablets (12.5 mg total) by mouth daily. (Patient taking differently: Take 12.5 mg by mouth daily. Pt has instructions to take the 25 mg if his BP >90/60.) 15 tablet 1    No results found for this or any previous visit (from the past 48 hour(s)). No results found.  Review Of Systems Constitutional: Positive for malaise/fatigue. Negative for fever, chills, weight loss and diaphoresis.  HENT: Negative for hearing loss, nosebleeds, sore throat and ear discharge.  Eyes: Negative for blurred vision, pain and discharge.  Respiratory: Positive for shortness of breath. Negative for cough and sputum production.  Cardiovascular: Positive for orthopnea. Negative for chest pain and leg swelling.  Gastrointestinal: Positive for abdominal pain. Negative for heartburn, nausea and constipation. + ascites.  Genitourinary: Negative for dysuria and urgency.  Musculoskeletal: Positive for joint pain. Negative for myalgias and back pain.  Skin: Negative for rash.  Neurological: Positive for weakness. Negative for dizziness, tremors, focal weakness, seizures and headaches.  Psychiatric/Behavioral: Positive for depression. Negative for suicidal ideas, memory loss and substance abuse. The patient is not nervous/anxious and does not have insomnia  Blood pressure 94/67, pulse 88, temperature 98.3 F (36.8 C), temperature source Oral, resp. rate 19, height 5' 5"  (1.651 m), weight 66.044 kg (145 lb 9.6 oz), SpO2 92 %. Physical Exam: Constitutional: He appears averagely-developed and poorly  nourished.  HENT: Head: Normocephalic and atraumatic. Mouth/Throat: No oropharyngeal exudate. Tongue-pink, wet and midline. Eyes: Conjunctivae are normal. Pupils are equal, round, and reactive to light. No scleral icterus.  Neck: Normal range of motion. JVD present. No tracheal deviation present. No thyromegaly present.  Cardiovascular: Normal rate and regular rhythm. III/VI systolic murmur heard.  Respiratory: He is in mild respiratory distress. He has few basal rales.  GI: Soft. He exhibits marked distension. There is no tenderness. There is no rebound.  Musculoskeletal: He exhibits no edema of lower leg and no tenderness.  Lymphadenopathy: He has no cervical adenopathy.  Neurological: He is alert and oriented to person, place, and time. No cranial nerve deficit. He moves all  4 extremities. Skin: Skin is warm and dry.  Psychiatric: He has a normal mood and affect.  Assessment/Plan Shortness of breath r/o CHF CAD CABG DM, II Dilated cardiomyopathy. Ascites  Admit/Oxygen, Lasix, home medications  Shelva Hetzer S, MD  02/10/2015, 2:38 PM

## 2015-02-10 NOTE — Progress Notes (Signed)
PT Cancellation Note  Patient Details Name: [name] MRN: [id number] DOB: [birth year]   Cancelled Treatment:    Reason Eval/Treat Not Completed: Medical issues which prohibited therapy PT order acknowledged. Most recent lab report reveals elevated troponin. Will hold until troponin demonstrates downward trend or patient is cleared by MD, per departmental guidelines.  [place], Arash Karstens S 02/10/2015, 5:32 PM Sunday SpillersLogan Secor McCame[place]bour, [place] [phone]

## 2015-02-10 NOTE — Progress Notes (Signed)
Called Dr Algie CofferKadakia office and Noitfied them of patient presence on the unit

## 2015-02-11 ENCOUNTER — Inpatient Hospital Stay (HOSPITAL_COMMUNITY): Payer: PPO

## 2015-02-11 LAB — GLUCOSE, CAPILLARY
GLUCOSE-CAPILLARY: 159 mg/dL — AB (ref 70–99)
GLUCOSE-CAPILLARY: 192 mg/dL — AB (ref 70–99)
Glucose-Capillary: 187 mg/dL — ABNORMAL HIGH (ref 70–99)
Glucose-Capillary: 200 mg/dL — ABNORMAL HIGH (ref 70–99)

## 2015-02-11 LAB — BASIC METABOLIC PANEL
ANION GAP: 15 (ref 5–15)
BUN: 87 mg/dL — ABNORMAL HIGH (ref 6–23)
CO2: 21 mmol/L (ref 19–32)
CREATININE: 2.16 mg/dL — AB (ref 0.50–1.35)
Calcium: 8.8 mg/dL (ref 8.4–10.5)
Chloride: 93 mmol/L — ABNORMAL LOW (ref 96–112)
GFR calc non Af Amer: 28 mL/min — ABNORMAL LOW (ref >90)
GFR, EST AFRICAN AMERICAN: 33 mL/min — AB (ref >90)
Glucose, Bld: 168 mg/dL — ABNORMAL HIGH (ref 70–99)
Potassium: 4.2 mmol/L (ref 3.5–5.1)
SODIUM: 129 mmol/L — AB (ref 135–145)

## 2015-02-11 LAB — TROPONIN I: Troponin I: 0.38 ng/mL — ABNORMAL HIGH (ref <0.031)

## 2015-02-11 MED ORDER — LIDOCAINE HCL (PF) 1 % IJ SOLN
INTRAMUSCULAR | Status: AC
Start: 2015-02-11 — End: 2015-02-11
  Administered 2015-02-11: 11:00:00
  Filled 2015-02-11: qty 10

## 2015-02-11 NOTE — Evaluation (Signed)
Physical Therapy Evaluation Patient Details Name: Francisco RidgeRaymond Pendry MRN: [id number] DOB: [birth year] Today's Date: 02/11/2015   History of Present Illness  Pt is 74 y/o male recently admitted for SOB with the following 5-6 pound weight gain with ascites  Clinical Impression  Pt admitted with above diagnosis. Pt currently with functional limitations due to the deficits listed below (see PT Problem List). Pt will benefit from skilled PT to increase their independence and safety with mobility to allow discharge to the venue listed below.      Follow Up Recommendations No PT follow up;Supervision/Assistance - 24 hour (depending on progress)    Equipment Recommendations  None recommended by PT    Recommendations for Other Services       Precautions / Restrictions Precautions Precautions: None      Mobility  Bed Mobility Overal bed mobility: Independent                Transfers Overall transfer level: Needs assistance Equipment used: Straight cane Transfers: Sit to/from Stand Sit to Stand: Min guard         General transfer comment: Minguard to safety due to overall generalized weakness.   Ambulation/Gait Ambulation/Gait assistance: Min guard Ambulation Distance (Feet): 100 Feet Assistive device: Straight cane Gait Pattern/deviations: Step-through pattern;Decreased stride length;Staggering left;Staggering right Gait velocity: decreased      Stairs            Wheelchair Mobility    Modified Rankin (Stroke Patients Only)       Balance Overall balance assessment: Needs assistance Sitting-balance support: Feet supported Sitting balance-Leahy Scale: Normal     Standing balance support: During functional activity Standing balance-Leahy Scale: Fair                               Pertinent Vitals/Pain Pain Assessment: No/denies pain    Home Living Family/patient expects to be discharged to:: Private residence Living Arrangements:  Spouse/significant other Available Help at Discharge: Family Type of Home: House Home Access: Stairs to enter Entrance Stairs-Rails: None Entrance Stairs-Number of Steps: 2 Home Layout: One level Home Equipment: Cane - single point      Prior Function Level of Independence: Independent         Comments: Pt was ambulating in home without assistive device until ~ 1 week pt increase fatigue and began ambulating with SPC.      Hand Dominance   Dominant Hand: Right    Extremity/Trunk Assessment               Lower Extremity Assessment: Generalized weakness         Communication   Communication: No difficulties  Cognition Arousal/Alertness: Awake/alert Behavior During Therapy: WFL for tasks assessed/performed Overall Cognitive Status: Within Functional Limits for tasks assessed                      General Comments      Exercises        Assessment/Plan    PT Assessment Patient needs continued PT services  PT Diagnosis Difficulty walking   PT Problem List Decreased strength;Decreased activity tolerance;Decreased balance;Decreased mobility;Decreased knowledge of use of DME  PT Treatment Interventions DME instruction;Gait training;Stair training;Functional mobility training;Therapeutic activities;Therapeutic exercise;Balance training;Patient/family education   PT Goals (Current goals can be found in the Care Plan section) Acute Rehab PT Goals Patient Stated Goal: To breath easier PT Goal Formulation: With patient Time For Goal Achievement: 02/18/15 Potential  to Achieve Goals: Good    Frequency Min 3X/week   Barriers to discharge        Co-evaluation               End of Session Equipment Utilized During Treatment: Gait belt Activity Tolerance: Patient tolerated treatment well Patient left: in bed;with call bell/phone within reach (sitting EOB getting ready to go to procedure)           Time: 1191-4782 PT Time Calculation (min)  (ACUTE ONLY): 19 min   Charges:   PT Evaluation $Initial PT Evaluation Tier I: 1 Procedure     PT G Codes:        Lamija Besse February 18, 2015, 12:20 PM   Jake Shark, PT DPT [phone]

## 2015-02-11 NOTE — Progress Notes (Signed)
Pharmacist Heart Failure Core Measure Documentation  Assessment: [name] has an EF documented as 30-35% on 12/27/14 by Echo.  Rationale: Heart failure patients with left ventricular systolic dysfunction (LVSD) and an EF < 40% should be prescribed an angiotensin converting enzyme inhibitor (ACEI) or angiotensin receptor blocker (ARB) at discharge unless a contraindication is documented in the medical record.  This patient is not currently on an ACEI or ARB for HF.  This note is being placed in the record in order to provide documentation that a contraindication to the use of these agents is present for this encounter.  ACE Inhibitor or Angiotensin Receptor Blocker is contraindicated (specify all that apply)  []   ACEI allergy AND ARB allergy []   Angioedema []   Moderate or severe aortic stenosis []   Hyperkalemia []   Hypotension []   Renal artery stenosis [x]   Worsening renal function, preexisting renal disease or dysfunction   [name], [name] Wang 02/11/2015 8:45 PM

## 2015-02-11 NOTE — Progress Notes (Signed)
Ref: [name],Francisco H, MD   Subjective:  Feeling 50 % better post paracentesis and 1.5 liter fluid removal. Elevated BNP and borderline troponin-I elevation.  Objective:  Vital Signs in the last 24 hours: Temp:  [97.9 F (36.6 C)-99.3 F (37.4 C)] 97.9 F (36.6 C) (04/23 1443) Pulse Rate:  [63-79] 79 (04/23 1443) Cardiac Rhythm:  [-] Atrial fibrillation (04/22 2100) Resp:  [14-20] 20 (04/23 1443) BP: (74-104)/(55-66) 94/62 mmHg (04/23 1447) SpO2:  [96 %-99 %] 99 % (04/23 1443) Weight:  [66.3 kg (146 lb 2.6 oz)] 66.3 kg (146 lb 2.6 oz) (04/23 0559)  Physical Exam: BP Readings from Last 1 Encounters:  02/11/15 94/62    Wt Readings from Last 1 Encounters:  02/11/15 66.3 kg (146 lb 2.6 oz)    Weight change:   HEENT: [place]/AT, Eyes-Blue, PERL, EOMI, Conjunctiva-Pink, Sclera-Non-icteric Neck: No JVD, No bruit, Trachea midline. Lungs:  Clearind, Bilateral. Cardiac:  Regular rhythm, normal S1 and S2, no S3. III/VI systolic murmur. Abdomen:  Soft, mildly distended but softer, non-tender. Extremities:  No edema present. No cyanosis. No clubbing. CNS: AxOx3, Cranial nerves grossly intact, moves all 4 extremities. Right handed. Skin: Warm and dry.   Intake/Output from previous day: 04/22 0701 - 04/23 0700 In: 120 [P.O.:120] Out: 575 [Urine:575]    Lab Results: BMET    Component Value Date/Time   NA 129* 02/11/2015 0535   NA 128* 02/10/2015 1520   NA 134* 12/28/2014 0505   K 4.2 02/11/2015 0535   K 3.7 02/10/2015 1520   K 3.7 12/28/2014 0505   CL 93* 02/11/2015 0535   CL 94* 02/10/2015 1520   CL 96 12/28/2014 0505   CO2 21 02/11/2015 0535   CO2 21 02/10/2015 1520   CO2 29 12/28/2014 0505   GLUCOSE 168* 02/11/2015 0535   GLUCOSE 175* 02/10/2015 1520   GLUCOSE 89 12/28/2014 0505   BUN 87* 02/11/2015 0535   BUN 72* 02/10/2015 1520   BUN 70* 12/28/2014 0505   CREATININE 2.16* 02/11/2015 0535   CREATININE 1.92* 02/10/2015 1520   CREATININE 1.51* 12/28/2014 0505   CALCIUM  8.8 02/11/2015 0535   CALCIUM 8.9 02/10/2015 1520   CALCIUM 9.1 12/28/2014 0505   GFRNONAA 28* 02/11/2015 0535   GFRNONAA 33* 02/10/2015 1520   GFRNONAA 44* 12/28/2014 0505   GFRAA 33* 02/11/2015 0535   GFRAA 38* 02/10/2015 1520   GFRAA 51* 12/28/2014 0505   CBC    Component Value Date/Time   WBC 13.2* 02/10/2015 1520   RBC 4.43 02/10/2015 1520   HGB 13.3 02/10/2015 1520   HCT 38.7* 02/10/2015 1520   PLT 121* 02/10/2015 1520   MCV 87.4 02/10/2015 1520   MCH 30.0 02/10/2015 1520   MCHC 34.4 02/10/2015 1520   RDW 14.9 02/10/2015 1520   LYMPHSABS 0.7 02/10/2015 1520   MONOABS 0.9 02/10/2015 1520   EOSABS 0.0 02/10/2015 1520   BASOSABS 0.0 02/10/2015 1520   HEPATIC Function Panel  Recent Labs  12/26/14 1745 02/10/15 1520  PROT 8.0 7.7   HEMOGLOBIN A1C No components found for: HGA1C,  MPG CARDIAC ENZYMES Lab Results  Component Value Date   CKTOTAL 72 11/01/2011   CKMB 3.0 11/01/2011   TROPONINI 0.38* 02/11/2015   TROPONINI 0.46* 02/10/2015   TROPONINI 0.22* 02/10/2015   BNP No results for input(s): PROBNP in the last 8760 hours. TSH No results for input(s): TSH in the last 8760 hours. CHOLESTEROL  Recent Labs  07/04/14 0957  CHOL 198    Scheduled Meds: .  aspirin EC  81 mg Oral Daily  . carvedilol  3.125 mg Oral BID WC  . furosemide  40 mg Intravenous Q12H  . heparin  5,000 Units Subcutaneous 3 times per day  . insulin aspart  0-9 Units Subcutaneous TID WC  . loratadine  10 mg Oral Daily  . potassium chloride  40 mEq Oral q morning - 10a  . sodium chloride  3 mL Intravenous Q12H  . spironolactone  12.5 mg Oral Daily   Continuous Infusions:  PRN Meds:.sodium chloride, acetaminophen, ondansetron (ZOFRAN) IV, sodium chloride  Assessment/Plan: Acute on chronic systolic heart failure CAD CABG DM, II Dilated cardiomyopathy. Ascites  Continue medical treatment.   LOS: 1 day    Orpah Cobb  MD  02/11/2015, 5:55 PM

## 2015-02-12 LAB — BASIC METABOLIC PANEL
Anion gap: 14 (ref 5–15)
BUN: 107 mg/dL — ABNORMAL HIGH (ref 6–23)
CO2: 21 mmol/L (ref 19–32)
CREATININE: 2.14 mg/dL — AB (ref 0.50–1.35)
Calcium: 8.5 mg/dL (ref 8.4–10.5)
Chloride: 90 mmol/L — ABNORMAL LOW (ref 96–112)
GFR calc Af Amer: 33 mL/min — ABNORMAL LOW (ref >90)
GFR calc non Af Amer: 29 mL/min — ABNORMAL LOW (ref >90)
GLUCOSE: 163 mg/dL — AB (ref 70–99)
Potassium: 4.2 mmol/L (ref 3.5–5.1)
Sodium: 125 mmol/L — ABNORMAL LOW (ref 135–145)

## 2015-02-12 LAB — GLUCOSE, CAPILLARY
GLUCOSE-CAPILLARY: 252 mg/dL — AB (ref 70–99)
Glucose-Capillary: 148 mg/dL — ABNORMAL HIGH (ref 70–99)
Glucose-Capillary: 267 mg/dL — ABNORMAL HIGH (ref 70–99)
Glucose-Capillary: 309 mg/dL — ABNORMAL HIGH (ref 70–99)

## 2015-02-12 MED ORDER — SODIUM CHLORIDE 0.9 % IV SOLN
INTRAVENOUS | Status: DC
  Administered 2015-02-12: 12:00:00 via INTRAVENOUS
  Administered 2015-02-13: 1000 mL via INTRAVENOUS
  Administered 2015-02-14: 06:00:00 via INTRAVENOUS

## 2015-02-12 NOTE — Progress Notes (Signed)
MD Algie CofferKadakia notifed of patients BP and heart rate. Clarified if RN should administer 8am carvidelol and 10am spiralactone. Orders received to ive both meds at this time. WIll continue to monitor.

## 2015-02-12 NOTE — Progress Notes (Signed)
Ref: Jeoffrey MassedMCGOWEN,PHILIP H, MD   Subjective:  Feeling better but sodium down to 125 meq.  Objective:  Vital Signs in the last 24 hours: Temp:  [97.6 F (36.4 C)-97.9 F (36.6 C)] 97.6 F (36.4 C) (04/24 0600) Pulse Rate:  [62-79] 79 (04/24 0920) Cardiac Rhythm:  [-] Atrial fibrillation (04/24 0923) Resp:  [16-20] 18 (04/24 0600) BP: (74-111)/(45-66) 101/45 mmHg (04/24 0920) SpO2:  [97 %-99 %] 99 % (04/24 0920) Weight:  [64.547 kg (142 lb 4.8 oz)] 64.547 kg (142 lb 4.8 oz) (04/24 0500)  Physical Exam: BP Readings from Last 1 Encounters:  02/12/15 101/45    Wt Readings from Last 1 Encounters:  02/12/15 64.547 kg (142 lb 4.8 oz)    Weight change: -1.497 kg (-3 lb 4.8 oz)  HEENT: [place]/AT, Eyes-Blue, PERL, EOMI, Conjunctiva-Pink, Sclera-Non-icteric Neck: No JVD, No bruit, Trachea midline. Lungs:  Clear, Bilateral. Cardiac:  Regular rhythm, normal S1 and S2, no S3.  Abdomen:  Soft, non-tender. Extremities:  No edema present. No cyanosis. No clubbing. CNS: AxOx3, Cranial nerves grossly intact, moves all 4 extremities. Right handed. Skin: Warm and dry.   Intake/Output from previous day: 04/23 0701 - 04/24 0700 In: 363 [P.O.:360; I.V.:3] Out: 800 [Urine:800]    Lab Results: BMET    Component Value Date/Time   NA 125* 02/12/2015 0503   NA 129* 02/11/2015 0535   NA 128* 02/10/2015 1520   K 4.2 02/12/2015 0503   K 4.2 02/11/2015 0535   K 3.7 02/10/2015 1520   CL 90* 02/12/2015 0503   CL 93* 02/11/2015 0535   CL 94* 02/10/2015 1520   CO2 21 02/12/2015 0503   CO2 21 02/11/2015 0535   CO2 21 02/10/2015 1520   GLUCOSE 163* 02/12/2015 0503   GLUCOSE 168* 02/11/2015 0535   GLUCOSE 175* 02/10/2015 1520   BUN 107* 02/12/2015 0503   BUN 87* 02/11/2015 0535   BUN 72* 02/10/2015 1520   CREATININE 2.14* 02/12/2015 0503   CREATININE 2.16* 02/11/2015 0535   CREATININE 1.92* 02/10/2015 1520   CALCIUM 8.5 02/12/2015 0503   CALCIUM 8.8 02/11/2015 0535   CALCIUM 8.9 02/10/2015 1520   GFRNONAA 29* 02/12/2015 0503   GFRNONAA 28* 02/11/2015 0535   GFRNONAA 33* 02/10/2015 1520   GFRAA 33* 02/12/2015 0503   GFRAA 33* 02/11/2015 0535   GFRAA 38* 02/10/2015 1520   CBC    Component Value Date/Time   WBC 13.2* 02/10/2015 1520   RBC 4.43 02/10/2015 1520   HGB 13.3 02/10/2015 1520   HCT 38.7* 02/10/2015 1520   PLT 121* 02/10/2015 1520   MCV 87.4 02/10/2015 1520   MCH 30.0 02/10/2015 1520   MCHC 34.4 02/10/2015 1520   RDW 14.9 02/10/2015 1520   LYMPHSABS 0.7 02/10/2015 1520   MONOABS 0.9 02/10/2015 1520   EOSABS 0.0 02/10/2015 1520   BASOSABS 0.0 02/10/2015 1520   HEPATIC Function Panel  Recent Labs  12/26/14 1745 02/10/15 1520  PROT 8.0 7.7   HEMOGLOBIN A1C No components found for: HGA1C,  MPG CARDIAC ENZYMES Lab Results  Component Value Date   CKTOTAL 72 11/01/2011   CKMB 3.0 11/01/2011   TROPONINI 0.38* 02/11/2015   TROPONINI 0.46* 02/10/2015   TROPONINI 0.22* 02/10/2015   BNP No results for input(s): PROBNP in the last 8760 hours. TSH No results for input(s): TSH in the last 8760 hours. CHOLESTEROL  Recent Labs  07/04/14 0957  CHOL 198    Scheduled Meds: . aspirin EC  81 mg Oral Daily  . carvedilol  3.125 mg Oral BID WC  . furosemide  40 mg Intravenous Q12H  . heparin  5,000 Units Subcutaneous 3 times per day  . insulin aspart  0-9 Units Subcutaneous TID WC  . loratadine  10 mg Oral Daily  . potassium chloride  40 mEq Oral q morning - 10a  . sodium chloride  3 mL Intravenous Q12H  . spironolactone  12.5 mg Oral Daily   Continuous Infusions: . sodium chloride     PRN Meds:.sodium chloride, acetaminophen, ondansetron (ZOFRAN) IV, sodium chloride  Assessment/Plan: Acute on chronic systolic heart failure CAD CABG DM, II Dilated cardiomyopathy. Ascites  NS infusion followed by lasix use     LOS: 2 days    Orpah Cobb  MD  02/12/2015, 9:55 AM

## 2015-02-13 LAB — BASIC METABOLIC PANEL
ANION GAP: 13 (ref 5–15)
BUN: 100 mg/dL — ABNORMAL HIGH (ref 6–23)
CALCIUM: 9 mg/dL (ref 8.4–10.5)
CHLORIDE: 95 mmol/L — AB (ref 96–112)
CO2: 21 mmol/L (ref 19–32)
CREATININE: 1.6 mg/dL — AB (ref 0.50–1.35)
GFR calc Af Amer: 47 mL/min — ABNORMAL LOW (ref >90)
GFR calc non Af Amer: 41 mL/min — ABNORMAL LOW (ref >90)
Glucose, Bld: 184 mg/dL — ABNORMAL HIGH (ref 70–99)
Potassium: 3.5 mmol/L (ref 3.5–5.1)
Sodium: 129 mmol/L — ABNORMAL LOW (ref 135–145)

## 2015-02-13 LAB — GLUCOSE, CAPILLARY
GLUCOSE-CAPILLARY: 174 mg/dL — AB (ref 70–99)
GLUCOSE-CAPILLARY: 199 mg/dL — AB (ref 70–99)
Glucose-Capillary: 227 mg/dL — ABNORMAL HIGH (ref 70–99)
Glucose-Capillary: 206 mg/dL — ABNORMAL HIGH (ref 70–99)

## 2015-02-13 NOTE — Progress Notes (Addendum)
Physical Therapy Treatment Patient Details Name: Vallery RidgeRaymond Cavallero MRN: [id number] DOB: 01/07/1941 Today's Date: 02/13/2015    History of Present Illness Pt is 74 y/o male recently admitted for SOB with the following 5-6 pound weight gain with ascites    PT Comments    Pt making steady progress.  Follow Up Recommendations  No PT follow up;Supervision/Assistance - 24 hour     Equipment Recommendations  None recommended by PT    Recommendations for Other Services       Precautions / Restrictions Precautions Precautions: Fall    Mobility  Bed Mobility                  Transfers Overall transfer level: Needs assistance Equipment used: Straight cane Transfers: Sit to/from Stand Sit to Stand: Min guard         General transfer comment: Minguard to safety due to overall generalized weakness.   Ambulation/Gait Ambulation/Gait assistance: Min guard;Min assist Ambulation Distance (Feet): 170 Feet Assistive device: Straight cane Gait Pattern/deviations: Step-through pattern;Decreased step length - right;Decreased step length - left;Shuffle Gait velocity: decreased Gait velocity interpretation: Below normal speed for age/gender General Gait Details: Pt with rt knee buckling toward end of amb and required min A to correct. Pt reports this happens at times.   Stairs            Wheelchair Mobility    Modified Rankin (Stroke Patients Only)       Balance Overall balance assessment: Needs assistance Sitting-balance support: Feet supported Sitting balance-Leahy Scale: Normal     Standing balance support: No upper extremity supported Standing balance-Leahy Scale: Fair                      Cognition Arousal/Alertness: Awake/alert Behavior During Therapy: WFL for tasks assessed/performed Overall Cognitive Status: Within Functional Limits for tasks assessed                      Exercises      General Comments        Pertinent  Vitals/Pain Pain Assessment: No/denies pain    Home Living                      Prior Function            PT Goals (current goals can now be found in the care plan section) Progress towards PT goals: Progressing toward goals    Frequency  Min 3X/week    PT Plan Current plan remains appropriate    Co-evaluation             End of Session Equipment Utilized During Treatment: Gait belt Activity Tolerance: Patient tolerated treatment well Patient left: in bed;with call bell/phone within reach;with family/visitor present (sitting EOB)     Time: 1131-1140 PT Time Calculation (min) (ACUTE ONLY): 9 min  Charges:  $Gait Training: 8-22 mins                    G Codes:      Law Corsino 02/13/2015, 12:57 PM  [employer] Avyn Aden PT [phone]

## 2015-02-13 NOTE — Progress Notes (Signed)
Ref: Jeoffrey MassedMCGOWEN,PHILIP H, MD   Subjective:  Feeling better. Sodium 129 meq. Creatinine down to 1.6.  Objective:  Vital Signs in the last 24 hours: Temp:  [97.4 F (36.3 C)-97.9 F (36.6 C)] 97.5 F (36.4 C) (04/25 0552) Pulse Rate:  [70-91] 76 (04/25 0810) Cardiac Rhythm:  [-] Atrial fibrillation (04/25 0810) Resp:  [19-20] 20 (04/25 0810) BP: (103-126)/(57-81) 114/59 mmHg (04/25 1051) SpO2:  [96 %-100 %] 100 % (04/25 0810) Weight:  [63.957 kg (141 lb)] 63.957 kg (141 lb) (04/25 0600)  Physical Exam: BP Readings from Last 1 Encounters:  02/13/15 114/59    Wt Readings from Last 1 Encounters:  02/13/15 63.957 kg (141 lb)    Weight change: -0.59 kg (-1 lb 4.8 oz)  HEENT: [place]/AT, Eyes-Blue, PERL, EOMI, Conjunctiva-Pink, Sclera-Non-icteric Neck: 1 + JVD, No bruit, Trachea midline. Lungs:  Clear, Bilateral. Cardiac:  Regular rhythm, normal S1 and S2, no S3. II/VI systolic murmur. Abdomen:  Soft, non-tender. Extremities:  No edema present. No cyanosis. No clubbing. CNS: AxOx3, Cranial nerves grossly intact, moves all 4 extremities. Right handed. Skin: Warm and dry.   Intake/Output from previous day: 04/24 0701 - 04/25 0700 In: 1082.5 [P.O.:720; I.V.:362.5] Out: 2300 [Urine:2300]    Lab Results: BMET    Component Value Date/Time   NA 129* 02/13/2015 0805   NA 125* 02/12/2015 0503   NA 129* 02/11/2015 0535   K 3.5 02/13/2015 0805   K 4.2 02/12/2015 0503   K 4.2 02/11/2015 0535   CL 95* 02/13/2015 0805   CL 90* 02/12/2015 0503   CL 93* 02/11/2015 0535   CO2 21 02/13/2015 0805   CO2 21 02/12/2015 0503   CO2 21 02/11/2015 0535   GLUCOSE 184* 02/13/2015 0805   GLUCOSE 163* 02/12/2015 0503   GLUCOSE 168* 02/11/2015 0535   BUN 100* 02/13/2015 0805   BUN 107* 02/12/2015 0503   BUN 87* 02/11/2015 0535   CREATININE 1.60* 02/13/2015 0805   CREATININE 2.14* 02/12/2015 0503   CREATININE 2.16* 02/11/2015 0535   CALCIUM 9.0 02/13/2015 0805   CALCIUM 8.5 02/12/2015 0503   CALCIUM 8.8 02/11/2015 0535   GFRNONAA 41* 02/13/2015 0805   GFRNONAA 29* 02/12/2015 0503   GFRNONAA 28* 02/11/2015 0535   GFRAA 47* 02/13/2015 0805   GFRAA 33* 02/12/2015 0503   GFRAA 33* 02/11/2015 0535   CBC    Component Value Date/Time   WBC 13.2* 02/10/2015 1520   RBC 4.43 02/10/2015 1520   HGB 13.3 02/10/2015 1520   HCT 38.7* 02/10/2015 1520   PLT 121* 02/10/2015 1520   MCV 87.4 02/10/2015 1520   MCH 30.0 02/10/2015 1520   MCHC 34.4 02/10/2015 1520   RDW 14.9 02/10/2015 1520   LYMPHSABS 0.7 02/10/2015 1520   MONOABS 0.9 02/10/2015 1520   EOSABS 0.0 02/10/2015 1520   BASOSABS 0.0 02/10/2015 1520   HEPATIC Function Panel  Recent Labs  12/26/14 1745 02/10/15 1520  PROT 8.0 7.7   HEMOGLOBIN A1C No components found for: HGA1C,  MPG CARDIAC ENZYMES Lab Results  Component Value Date   CKTOTAL 72 11/01/2011   CKMB 3.0 11/01/2011   TROPONINI 0.38* 02/11/2015   TROPONINI 0.46* 02/10/2015   TROPONINI 0.22* 02/10/2015   BNP No results for input(s): PROBNP in the last 8760 hours. TSH No results for input(s): TSH in the last 8760 hours. CHOLESTEROL  Recent Labs  07/04/14 0957  CHOL 198    Scheduled Meds: . aspirin EC  81 mg Oral Daily  . carvedilol  3.125  mg Oral BID WC  . furosemide  40 mg Intravenous Q12H  . heparin  5,000 Units Subcutaneous 3 times per day  . insulin aspart  0-9 Units Subcutaneous TID WC  . loratadine  10 mg Oral Daily  . potassium chloride  40 mEq Oral q morning - 10a  . sodium chloride  3 mL Intravenous Q12H  . spironolactone  12.5 mg Oral Daily   Continuous Infusions: . sodium chloride 1,000 mL (02/13/15 0810)   PRN Meds:.sodium chloride, acetaminophen, ondansetron (ZOFRAN) IV, sodium chloride  Assessment/Plan: Acute on chronic systolic heart failure CAD CABG DM, II Dilated cardiomyopathy. Ascites CKD, II  Continue medical treatment.     LOS: 3 days    Orpah Cobb  MD  02/13/2015, 1:05 PM

## 2015-02-13 NOTE — Care Management Note (Unsigned)
    Page 1 of 1   02/13/2015     11:49:56 AM CARE MANAGEMENT NOTE 02/13/2015  Patient:  [name],Francisco   Account Number:  [id number]  Date Initiated:  02/13/2015  Documentation initiated by:  Letha CapeAYLOR,Stefany Starace  Subjective/Objective Assessment:   dx sob, fluid overload  admit- from home     Action/Plan:   paracentesis  pt eval- no pt f/u   Anticipated DC Date:  02/13/2015   Anticipated DC Plan:  HOME W HOME HEALTH SERVICES      DC Planning Services  CM consult      Choice offered to / List presented to:             Status of service:  In process, will continue to follow Medicare Important Message given?  YES (If response is "NO", the following Medicare IM given date fields will be blank) Date Medicare IM given:  02/13/2015 Medicare IM given by:  Letha CapeAYLOR,Corona Popovich Date Additional Medicare IM given:   Additional Medicare IM given by:    Discharge Disposition:    Per UR Regulation:  Reviewed for med. necessity/level of care/duration of stay  If discussed at Long Length of Stay Meetings, dates discussed:    Comments:  02/13/15 1148 Letha Capeeborah Shaiden Aldous RN, BSN [phone] per physical therapy no pt f/u needed.  NCM will cont to follow for dc needs. Patient conts on iv lasix for fluid overload, having freq pvc's.

## 2015-02-13 NOTE — Progress Notes (Signed)
Physical Therapy Treatment Patient Details Name: Vallery RidgeRaymond Maxon MRN: [id number] DOB: 1941-09-15 Today's Date: 02/13/2015    History of Present Illness Pt is 74 y/o male recently admitted for SOB with the following 5-6 pound weight gain with ascites    PT Comments    Pt making steady progress.  Follow Up Recommendations  No PT follow up;Supervision/Assistance - 24 hour     Equipment Recommendations  None recommended by PT    Recommendations for Other Services       Precautions / Restrictions Precautions Precautions: Fall    Mobility  Bed Mobility Overal bed mobility: Modified Independent             General bed mobility comments: Incr time  Transfers Overall transfer level: Needs assistance Equipment used: Straight cane Transfers: Sit to/from Stand Sit to Stand: Min guard         General transfer comment: min guard for safety  Ambulation/Gait Ambulation/Gait assistance: Min guard Ambulation Distance (Feet): 200 Feet Assistive device: Straight cane Gait Pattern/deviations: Step-through pattern;Decreased step length - right;Decreased step length - left;Shuffle Gait velocity: decreased Gait velocity interpretation: Below normal speed for age/gender General Gait Details: Assist for safety   Stairs            Wheelchair Mobility    Modified Rankin (Stroke Patients Only)       Balance Overall balance assessment: Needs assistance Sitting-balance support: Feet supported Sitting balance-Leahy Scale: Normal     Standing balance support: No upper extremity supported Standing balance-Leahy Scale: Fair                      Cognition Arousal/Alertness: Awake/alert Behavior During Therapy: WFL for tasks assessed/performed Overall Cognitive Status: Within Functional Limits for tasks assessed                      Exercises General Exercises - Lower Extremity Ankle Circles/Pumps: AROM;Both;10 reps;Seated Long Arc Quad:  AROM;Both;10 reps;Seated Hip Flexion/Marching: AROM;Both;5 reps;Seated    General Comments        Pertinent Vitals/Pain Pain Assessment: No/denies pain    Home Living                      Prior Function            PT Goals (current goals can now be found in the care plan section) Progress towards PT goals: Progressing toward goals    Frequency  Min 3X/week    PT Plan Current plan remains appropriate    Co-evaluation             End of Session Equipment Utilized During Treatment: Gait belt Activity Tolerance: Patient tolerated treatment well Patient left: in bed;with call bell/phone within reach;with bed alarm set     Time: 1422-1433 PT Time Calculation (min) (ACUTE ONLY): 11 min  Charges:  $Gait Training: 8-22 mins                    G Codes:      Alioune Hodgkin 02/13/2015, 2:45 PM  [facility] Cornfield PT [phone]

## 2015-02-13 NOTE — Progress Notes (Signed)
Inpatient Diabetes Program Recommendations  AACE/ADA: New Consensus Statement on Inpatient Glycemic Control (2013)  Target Ranges:  Prepandial:   less than 140 mg/dL      Peak postprandial:   less than 180 mg/dL (1-2 hours)      Critically ill patients:  140 - 180 mg/dL   Pt takes novolin 40/9870/30 at home: 25 units in the am and 15 units before supper. Please consider addition of 70/30 insulin here at 15 units in the am and 8 units ac supper to start (in addition to sensitive correction tidwc) Pt eating well.  Thank you Lenor CoffinAnn Verlene Glantz, RN, MSN, CDE  Diabetes Inpatient Program Office: [phone] Pager: [phone] 8:00 am to 5:00 pm

## 2015-02-14 ENCOUNTER — Telehealth: Payer: Self-pay | Admitting: Family Medicine

## 2015-02-14 LAB — BASIC METABOLIC PANEL
Anion gap: 15 (ref 5–15)
BUN: 80 mg/dL — ABNORMAL HIGH (ref 6–23)
CHLORIDE: 100 mmol/L (ref 96–112)
CO2: 20 mmol/L (ref 19–32)
CREATININE: 1.3 mg/dL (ref 0.50–1.35)
Calcium: 8.7 mg/dL (ref 8.4–10.5)
GFR calc Af Amer: 61 mL/min — ABNORMAL LOW (ref >90)
GFR calc non Af Amer: 52 mL/min — ABNORMAL LOW (ref >90)
Glucose, Bld: 187 mg/dL — ABNORMAL HIGH (ref 70–99)
POTASSIUM: 3.4 mmol/L — AB (ref 3.5–5.1)
SODIUM: 135 mmol/L (ref 135–145)

## 2015-02-14 LAB — GLUCOSE, CAPILLARY
GLUCOSE-CAPILLARY: 250 mg/dL — AB (ref 70–99)
Glucose-Capillary: 175 mg/dL — ABNORMAL HIGH (ref 70–99)

## 2015-02-14 MED ORDER — ONETOUCH ULTRA SYSTEM W/DEVICE KIT: 1.0000 | PACK | Freq: Once | Status: DC

## 2015-02-14 NOTE — Progress Notes (Signed)
[name] discharged Home with wife per MD order.  Discharge instructions reviewed and discussed with the patient, all questions and concerns answered. Copy of instructions given to patient.    Medication List    TAKE these medications        carvedilol 3.125 MG tablet  Commonly known as:  COREG  Take 1 tablet (3.125 mg total) by mouth 2 (two) times daily with a meal.     furosemide 80 MG tablet  Commonly known as:  LASIX  Take 80 mg by mouth 3 (three) times daily.     glucose blood test strip  Please check glucose BID, PRN or as directed.     insulin NPH-regular Human (70-30) 100 UNIT/ML injection  Commonly known as:  HUMULIN 70/30  25 U SQ at breakfast and 15 U SQ at supper     metolazone 2.5 MG tablet  Commonly known as:  ZAROXOLYN  Take 1 tablet (2.5 mg total) by mouth every other day.     miconazole 2 % powder  Commonly known as:  MICOTIN  Apply 1 application topically 3 (three) times daily as needed for itching (rash).     ONE TOUCH ULTRA SYSTEM KIT W/DEVICE Kit  1 kit by Does not apply route once.     OVER THE COUNTER MEDICATION  Take 2 capsules by mouth every morning. Ultra guard forte.  Multi vitamin in food form     potassium chloride SA 20 MEQ tablet  Commonly known as:  K-DUR,KLOR-CON  Take 1-2 tablets (20-40 mEq total) by mouth 2 (two) times daily. 2 tabs in the morning and 1 tab in the evening     spironolactone 12.5 mg Tabs tablet  Commonly known as:  ALDACTONE  Take 0.5 tablets (12.5 mg total) by mouth daily.        Patients skin is clean, dry and intact, no evidence of skin break down. IV site discontinued and catheter remains intact. Site without signs and symptoms of complications. Dressing and pressure applied.  Patient escorted to car by volunteer in a wheelchair,  no distress noted upon discharge.  Germer, Philip Eckersley C 02/14/2015 12:29 PM

## 2015-02-14 NOTE — Telephone Encounter (Signed)
Patient needs diabetic supplies. Please call him.

## 2015-02-14 NOTE — Telephone Encounter (Signed)
Spoke to pharmacy tech. She states that pt got his strips but they never receieved Rx for glucometer and lancets.  I sent this one 02/08/15 and [facility] gave a receipt of confirmation, however, pharm tech asked me to resend E-RX.  [facility]

## 2015-02-14 NOTE — Discharge Summary (Signed)
Physician Discharge Summary  Patient ID: [name] MRN: [id number] DOB/AGE: Feb 27, 1941 74 y.o.  Admit date: 02/10/2015 Discharge date: 02/14/2015  Admission Diagnoses: Acute on chronic systolic heart failure CAD CABG DM, II Dilated cardiomyopathy. Ascites CKD, II  Discharge Diagnoses:  Principal Problem: Acute on chronic systolic heart failure * Active Problems: CAD CABG DM, II Dilated cardiomyopathy. Ascites CKD, II  Discharged Condition: fair  Hospital Course: 74 year old male with known CAD, CABG, Type II DM and CHF has 5-6 pound weight gain with ascites and shortness of breath without leg edema. No fever or cough. His BNP was elevated at 1735.6 pg/mL. He diuresed little before removing 1.5 L fluid by paracentesis. Then his urine out put and his renal function improved. He was discharged home in satisfactory condition with follow up by primary care in 1 week.  Consults: cardiology  Significant Diagnostic Studies: labs: Mildly elevated WBC count and mildly low platelets count of 121 K. Normal Hgb and HCt. Low sodium of 125 meq. Improved to 135 meq. Potassium normal to mildly low. Blood sugars 174 to 206 mg/dL.  Chest X-ray: No radiographic evidence of acute cardiopulmonary disease, with a background of chronic changes, potentially related to prior episodes of CHF.  Surgical changes of median sternotomy and CABG.  EKG: SR with 1st degree AV block, inferior and lateral ischemia.  Treatments: cardiac meds: carvedilol and furosemide  Discharge Exam: Blood pressure 122/70, pulse 69, temperature 97.6 F (36.4 C), temperature source Oral, resp. rate 18, height 5' 5"  (1.651 m), weight 65.772 kg (145 lb), SpO2 98 %. HEENT: [place]/AT, Eyes-Blue, PERL, EOMI, Conjunctiva-Pink, Sclera-Non-icteric Neck: 1 + JVD, No bruit, Trachea midline. Lungs: Clear, Bilateral. Cardiac: Regular rhythm, normal S1 and S2, no S3. II/VI systolic murmur. Abdomen: Soft, non-tender. Extremities:  No edema present. No cyanosis. No clubbing. CNS: AxOx3, Cranial nerves grossly intact, moves all 4 extremities. Right handed. Skin: Warm and dry.  Disposition: 01-Home or Self Care     Medication List    TAKE these medications        carvedilol 3.125 MG tablet  Commonly known as:  COREG  Take 1 tablet (3.125 mg total) by mouth 2 (two) times daily with a meal.     furosemide 80 MG tablet  Commonly known as:  LASIX  Take 80 mg by mouth 3 (three) times daily.     glucose blood test strip  Please check glucose BID, PRN or as directed.     insulin NPH-regular Human (70-30) 100 UNIT/ML injection  Commonly known as:  HUMULIN 70/30  25 U SQ at breakfast and 15 U SQ at supper     metolazone 2.5 MG tablet  Commonly known as:  ZAROXOLYN  Take 1 tablet (2.5 mg total) by mouth every other day.     miconazole 2 % powder  Commonly known as:  MICOTIN  Apply 1 application topically 3 (three) times daily as needed for itching (rash).     ONE TOUCH ULTRA SYSTEM KIT W/DEVICE Kit  1 kit by Does not apply route once.     OVER THE COUNTER MEDICATION  Take 2 capsules by mouth every morning. Ultra guard forte.  Multi vitamin in food form     potassium chloride SA 20 MEQ tablet  Commonly known as:  K-DUR,KLOR-CON  Take 1-2 tablets (20-40 mEq total) by mouth 2 (two) times daily. 2 tabs in the morning and 1 tab in the evening     spironolactone 12.5 mg Tabs tablet  Commonly  known as:  ALDACTONE  Take 0.5 tablets (12.5 mg total) by mouth daily.           Follow-up Information    Follow up with MCGOWEN,PHILIP H, MD. Schedule an appointment as soon as possible for a visit in 1 week.   Specialty:  Family Medicine   Contact information:   [address] [place] [address] [phone]       Follow up with [facility], MD. Schedule an appointment as soon as possible for a visit in 1 month.   Specialty:  Cardiology   Contact information:   [place]  [place] [address] [phone]       Signed: Birdie Riddle 02/14/2015, 9:45 AM

## 2015-02-14 NOTE — Progress Notes (Signed)
Pt. Had a 3 beat run of VT this am.  VSS - Blood pressure 110/55, pulse 70, temperature 97.6 F (36.4 C), temperature source Oral, resp. rate 16, height 5\' 5"  (1.651 m), weight 65.772 kg (145 lb), SpO2 98 %., R/A .  Dr. Algie CofferKadakia up on the floor seeing pt. And was informed of VT and saw strip.  Informed to give 10am dose of potassium now.  Will continue to monitor.  Forbes Cellarelcine Isaacs, RN

## 2015-02-15 ENCOUNTER — Other Ambulatory Visit: Payer: Self-pay | Admitting: *Deleted

## 2015-02-16 ENCOUNTER — Encounter: Payer: Self-pay | Admitting: Family Medicine

## 2015-02-16 ENCOUNTER — Ambulatory Visit: Payer: Medicare HMO | Admitting: Family Medicine

## 2015-02-17 ENCOUNTER — Ambulatory Visit (INDEPENDENT_AMBULATORY_CARE_PROVIDER_SITE_OTHER): Payer: PPO | Admitting: Family Medicine

## 2015-02-17 ENCOUNTER — Encounter: Payer: Self-pay | Admitting: Family Medicine

## 2015-02-17 ENCOUNTER — Other Ambulatory Visit: Payer: Self-pay | Admitting: *Deleted

## 2015-02-17 VITALS — BP 116/74 | HR 77 | Temp 97.5°F | Resp 18 | Ht 65.0 in | Wt 142.0 lb

## 2015-02-17 DIAGNOSIS — I429 Cardiomyopathy, unspecified: Secondary | ICD-10-CM | POA: Diagnosis not present

## 2015-02-17 DIAGNOSIS — I420 Dilated cardiomyopathy: Secondary | ICD-10-CM

## 2015-02-17 DIAGNOSIS — D72819 Decreased white blood cell count, unspecified: Secondary | ICD-10-CM

## 2015-02-17 DIAGNOSIS — D696 Thrombocytopenia, unspecified: Secondary | ICD-10-CM | POA: Diagnosis not present

## 2015-02-17 DIAGNOSIS — I5023 Acute on chronic systolic (congestive) heart failure: Secondary | ICD-10-CM | POA: Diagnosis not present

## 2015-02-17 LAB — CBC WITH DIFFERENTIAL/PLATELET
Basophils Absolute: 0.1 10*3/uL (ref 0.0–0.1)
Basophils Relative: 0.6 % (ref 0.0–3.0)
EOS PCT: 4.5 % (ref 0.0–5.0)
Eosinophils Absolute: 0.4 10*3/uL (ref 0.0–0.7)
HCT: 40.5 % (ref 39.0–52.0)
Hemoglobin: 13.6 g/dL (ref 13.0–17.0)
LYMPHS ABS: 1 10*3/uL (ref 0.7–4.0)
Lymphocytes Relative: 12.3 % (ref 12.0–46.0)
MCHC: 33.6 g/dL (ref 30.0–36.0)
MCV: 88.6 fl (ref 78.0–100.0)
MONO ABS: 0.8 10*3/uL (ref 0.1–1.0)
Monocytes Relative: 9.6 % (ref 3.0–12.0)
Neutro Abs: 5.9 10*3/uL (ref 1.4–7.7)
Neutrophils Relative %: 73 % (ref 43.0–77.0)
Platelets: 188 10*3/uL (ref 150.0–400.0)
RBC: 4.57 Mil/uL (ref 4.22–5.81)
RDW: 15.2 % (ref 11.5–15.5)
WBC: 8.1 10*3/uL (ref 4.0–10.5)

## 2015-02-17 LAB — BASIC METABOLIC PANEL
BUN: 54 mg/dL — AB (ref 6–23)
CALCIUM: 10.1 mg/dL (ref 8.4–10.5)
CO2: 29 meq/L (ref 19–32)
CREATININE: 1.26 mg/dL (ref 0.40–1.50)
Chloride: 92 mEq/L — ABNORMAL LOW (ref 96–112)
GFR: 59.32 mL/min — AB (ref >60.00)
GLUCOSE: 151 mg/dL — AB (ref 70–99)
POTASSIUM: 4.8 meq/L (ref 3.5–5.1)
Sodium: 130 mEq/L — ABNORMAL LOW (ref 135–145)

## 2015-02-17 NOTE — Patient Outreach (Signed)
[facility] Care Management  02/17/2015  Vallery RidgeRaymond Chipley [birth year] [id number]   RN spoke with pt today who indicates he is doing a "whole lot better". Reports swelling much improved with a weight of 136.6 and he is in the GREEN zone with no additional problems or encountered issues.  Reports he had his follow up appointment today with Dr. Milinda CaveMcGowen and will follow up again in July. Pt will follow up with Dr. Algie CofferKadakia accordingly.  No other issues reported as pt continues to recovery with other issues.     Elliot CousinLisa Tyshea Imel, RN Care Management Coordinator [facility] Main Office [phone]

## 2015-02-17 NOTE — Progress Notes (Signed)
Pre visit review using our clinic review tool, if applicable. No additional management support is needed unless otherwise documented below in the visit note. 

## 2015-02-17 NOTE — Progress Notes (Signed)
OFFICE VISIT  02/17/2015   CC:  Chief Complaint  Patient presents with  . Follow-up     HPI:    Patient is a 74 y.o. Caucasian male who presents for hospital f/u: admitted 4/22-4/26, 2016 for acute on chronic systolic heart failure. Reviewed hospital notes, labs, d/c summary.  He had paracentesis while in hosp, then diuresed some as well and was ready for d/c home.  No acute coronary event was detected.   He feels like his breathing and abdomen are fine.  Has some excessive bruising from having had heparin in the hospital.  Denies CP, fever, SOB with ambulation, or abd pain.  Past Medical History  Diagnosis Date  . Atrial fibrillation     ?need old records: ? GI bleed in past, plus pt tends to "self adjust/manage" his meds w/out regard for MD's guidance, so he has NOT been on anticoagulant for this.  . Coronary artery disease   . Hypertension   . Chronic systolic heart failure   . High cholesterol   . Pulmonary embolism 1980's or 1990's  . Pneumonia   . 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 Gout   . Chronic renal insufficiency, stage III (moderate)     stage II/III  . Heart murmur   . Chronic bronchitis     "used to get it often; haven't had it for awhile now" (04/12/2013)  . Type II diabetes mellitus   . Postpolio syndrome 1951    "affected the right side of my body; been weak on that side ever since" (04/12/2013)  . Severe mitral regurgitation 2013  . Severe tricuspid regurgitation 2013  . 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 Katy Fitch)    Past Surgical History  Procedure Laterality Date  . Retinal laser procedure Bilateral     "more than once on the left" (04/12/2013)  . Cataract extraction w/ intraocular lens  implant, bilateral Bilateral 2005  . Appendectomy  ~ 1962  . Cardiac catheterization      "I've had a few since 1989" (04/12/2013)  . Cervical discectomy  1984    C6-7  . Coronary artery bypass graft   01/1988; 05/1999    CABG X 3; CABG X 2  . Exploratory laparotomy  ~ 1962    "took out my appendix while there; bowel was inflammed" (04/12/2013)  . Hydrocele excision / repair      "couple times" (04/12/2013)  . Transthoracic echocardiogram  2013;12/2014    LV dilation, EF 30-35%, diffuse hypokinesis, grade I diast dysfxn, severe mitral and tricuspid regurg, LAE and RAE--biventricular dysfunction moderate/severe  . Paracentesis  2013-2014    Multiple times, usually pulling off 6-7 liters of fluid  . Cardiac catheterization  12/2012    Severe native CAD, patent bypass grafts x 3, EF 25-30%--med mgmt  . Left and right heart catheterization with coronary/graft angiogram  01/13/2013    Procedure: LEFT AND RIGHT HEART CATHETERIZATION WITH Beatrix Fetters;  Surgeon: Birdie Riddle, MD;  Location: [facility];  Service: Cardiovascular;;    Outpatient Prescriptions Prior to Visit  Medication Sig Dispense Refill  . carvedilol (COREG) 3.125 MG tablet Take 1 tablet (3.125 mg total) by mouth 2 (two) times daily with a meal.    . furosemide (LASIX) 80 MG tablet Take 80 mg by mouth 3 (three) times daily.    . insulin NPH-regular Human (HUMULIN 70/30) (70-30) 100 UNIT/ML injection 25  U SQ at breakfast and 15 U SQ at supper 10 mL 6  . metolazone (ZAROXOLYN) 2.5 MG tablet Take 1 tablet (2.5 mg total) by mouth every other day. 15 tablet 1  . miconazole (MICOTIN) 2 % powder Apply 1 application topically 3 (three) times daily as needed for itching (rash).     . potassium chloride SA (K-DUR,KLOR-CON) 20 MEQ tablet Take 1-2 tablets (20-40 mEq total) by mouth 2 (two) times daily. 2 tabs in the morning and 1 tab in the evening    . spironolactone (ALDACTONE) 12.5 mg TABS Take 0.5 tablets (12.5 mg total) by mouth daily. (Patient taking differently: Take 12.5 mg by mouth daily. Pt has instructions to take the 25 mg if his BP >90/60.) 15 tablet 1  . Blood Glucose Monitoring Suppl (ONE TOUCH ULTRA SYSTEM KIT)  W/DEVICE KIT 1 kit by Does not apply route once. Pt should check his glucose TID 1 each 0  . glucose blood test strip Please check glucose BID, PRN or as directed. 100 each 3  . OVER THE COUNTER MEDICATION Take 2 capsules by mouth every morning. Ultra guard forte.  Multi vitamin in food form     No facility-administered medications prior to visit.    Allergies  Allergen Reactions  . Adhesive [Tape] Other (See Comments)    Pulls skin off. Please use "paper" tape.  . Sulfa Antibiotics Rash    ROS As per HPI  PE: Blood pressure 116/74, pulse 77, temperature 97.5 F (36.4 C), temperature source Temporal, resp. rate 18, height 5' 5" (1.651 m), weight 142 lb (64.411 kg), SpO2 100 %. Gen: Alert, well appearing.  Patient is oriented to person, place, time, and situation. AFFECT: pleasant, lucid thought and speech. JJK:KXFG: no injection, icteris, swelling, or exudate.  EOMI, PERRLA. Mouth: lips without lesion/swelling.  Oral mucosa pink and moist. Oropharynx without erythema, exudate, or swelling.  CV: Irreg irreg rhythm, rate about 80, harsh/whistling toned sysolic murmur LUNGS: CTA bilat, nonlabored resps. ABD: soft, NT, rotund and with mild flank dullness but no significant distention.  He has an abdomen that I can tell has hx of ascites but no appreciable amount at this time.   EXT: no clubbing, cyanosis, or edema.  SKIN: lots of bruising on arms and a few on lower legs.and on L mid and lower abd wall  LABS:    Chemistry      Component Value Date/Time   NA 135 02/14/2015 0720   K 3.4* 02/14/2015 0720   CL 100 02/14/2015 0720   CO2 20 02/14/2015 0720   BUN 80* 02/14/2015 0720   CREATININE 1.30 02/14/2015 0720      Component Value Date/Time   CALCIUM 8.7 02/14/2015 0720   ALKPHOS 166* 02/10/2015 1520   AST 52* 02/10/2015 1520   ALT 28 02/10/2015 1520   BILITOT 3.1* 02/10/2015 1520     Lab Results  Component Value Date   WBC 13.2* 02/10/2015   HGB 13.3 02/10/2015   HCT  38.7* 02/10/2015   MCV 87.4 02/10/2015   PLT 121* 02/10/2015   Lab Results  Component Value Date   HGBA1C 8.5* 12/26/2014    IMPRESSION AND PLAN:  1) 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   He is back to baseline now, energy level improving.  Compliant with meds, diet, fluid restrictions. Has f/u arranged with Dr. Doylene Canard, his cardiologist.  2) A-fib: on low dose coreg for rate control.  NOT anticoag candidate due to hx of excessive bleeding on oral anticoagulants, plus pt not a reliable medication-taker.  3) Mild leukopenia and thrombocytopenia: likely secondary to chronic hepatic congestion and portal HTN/splenomegaly (sequesetration). Repeat CBC today.  An After Visit Summary was printed and given to the patient.  FOLLOW UP: Return for keep appt set for 05/15/15.

## 2015-02-19 DIAGNOSIS — K429 Umbilical hernia without obstruction or gangrene: Secondary | ICD-10-CM

## 2015-02-19 HISTORY — DX: Umbilical hernia without obstruction or gangrene: K42.9

## 2015-02-20 ENCOUNTER — Encounter: Payer: Self-pay | Admitting: Family Medicine

## 2015-02-20 ENCOUNTER — Telehealth: Payer: Self-pay | Admitting: Family Medicine

## 2015-02-20 MED ORDER — ONETOUCH ULTRA SYSTEM W/DEVICE KIT: 1.0000 | PACK | Freq: Once | Status: DC

## 2015-02-20 NOTE — Telephone Encounter (Signed)
Can you please write an Rx with the insulin syringes and the lancets on it so I can fax this to the pharmacy?   I spoke to pharmacy and he has refills on glucose strips.

## 2015-02-21 ENCOUNTER — Other Ambulatory Visit: Payer: Self-pay | Admitting: *Deleted

## 2015-02-21 ENCOUNTER — Ambulatory Visit (INDEPENDENT_AMBULATORY_CARE_PROVIDER_SITE_OTHER): Payer: PPO | Admitting: Emergency Medicine

## 2015-02-21 ENCOUNTER — Ambulatory Visit (INDEPENDENT_AMBULATORY_CARE_PROVIDER_SITE_OTHER): Payer: PPO

## 2015-02-21 VITALS — BP 106/64 | HR 72 | Temp 97.2°F | Resp 18 | Ht 65.0 in | Wt 135.0 lb

## 2015-02-21 DIAGNOSIS — K420 Umbilical hernia with obstruction, without gangrene: Secondary | ICD-10-CM | POA: Diagnosis not present

## 2015-02-21 DIAGNOSIS — R1013 Epigastric pain: Secondary | ICD-10-CM

## 2015-02-21 LAB — POCT UA - MICROSCOPIC ONLY
Bacteria, U Microscopic: NEGATIVE
Casts, Ur, LPF, POC: NEGATIVE
Crystals, Ur, HPF, POC: NEGATIVE
Epithelial cells, urine per micros: NEGATIVE
Mucus, UA: NEGATIVE
YEAST UA: NEGATIVE

## 2015-02-21 LAB — POCT CBC
Granulocyte percent: 81.9 %G — AB (ref 37–80)
HCT, POC: 45.8 % (ref 43.5–53.7)
Hemoglobin: 14.9 g/dL (ref 14.1–18.1)
Lymph, poc: 1.3 (ref 0.6–3.4)
MCH: 29.4 pg (ref 27–31.2)
MCHC: 32.5 g/dL (ref 31.8–35.4)
MCV: 90.6 fL (ref 80–97)
MID (cbc): 0.5 (ref 0–0.9)
MPV: 7.4 fL (ref 0–99.8)
PLATELET COUNT, POC: 256 10*3/uL (ref 142–424)
POC Granulocyte: 8.3 — AB (ref 2–6.9)
POC LYMPH PERCENT: 13.2 %L (ref 10–50)
POC MID %: 4.9 %M (ref 0–12)
RBC: 5.05 M/uL (ref 4.69–6.13)
RDW, POC: 16.5 %
WBC: 10.1 10*3/uL (ref 4.6–10.2)

## 2015-02-21 LAB — POCT URINALYSIS DIPSTICK
Bilirubin, UA: NEGATIVE
Glucose, UA: NEGATIVE
KETONES UA: NEGATIVE
Leukocytes, UA: NEGATIVE
Nitrite, UA: NEGATIVE
PH UA: 7.5
Protein, UA: NEGATIVE
RBC UA: NEGATIVE
Spec Grav, UA: 1.015
Urobilinogen, UA: 0.2

## 2015-02-21 LAB — GLUCOSE, POCT (MANUAL RESULT ENTRY): POC Glucose: 67 mg/dl — AB (ref 70–99)

## 2015-02-21 MED ORDER — "INSULIN SYRINGE-NEEDLE U-100 31G X 5/16"" 0.3 ML MISC": Status: DC

## 2015-02-21 MED ORDER — ONETOUCH ULTRASOFT LANCETS MISC: Status: DC

## 2015-02-21 NOTE — Patient Outreach (Signed)
[facility] Care Management  02/21/2015  Vallery RidgeRaymond Sailors 1940-12-05 [id number]   RN spoke with pt today who indicates he is having some "stomach pain". Pt states on a scale of 1-10 it's about a 6. States he usually takes Pepto-bismol but this is not working today. Reports some nausea but no vomiting, swelling and able to tolerate meals with no problems. Last BM was last night with no problems reported at that time.  States he has weighed today with a weight of 135 lbs and remains in the GREEN zone as far as his HF. Pt states due to his ongoing pain he will go to the Urgent Care center. RN encouraged pt to go if pain continues with no relief. RN reminded [facility] hot[facility] if after hours or over the weekend if needed. RN will continues ongoing follow contact via transition of care calls accordingly. Pt very appreciative and grateful for the calls. No additional inquires or request at this time.   Elliot CousinLisa Anika Shore, RN Care Management Coordinator [facility] Main Office [phone]

## 2015-02-21 NOTE — Telephone Encounter (Signed)
Nevermind,  I sent in electronically.  ( or at least I tried )

## 2015-02-21 NOTE — Progress Notes (Signed)
[facility] [address] [place] [address] [phone]  Date:  02/21/2015   Name:  [name]   DOB:  09-05-1941   MRN:  [id number]  PCP:  Tammi Sou, MD    Chief Complaint: Abdominal Pain; Nausea; and Emesis   History of Present Illness:  [name] is a 74 y.o. very pleasant male patient who presents with the following:  Patient says he awakened this morning with abdominal pain that was generalized. Nauseated and dry heaving. Poor appetite Now has very severe abdominal pain. No stool change No increased shortness of breath. No fever or chills In hospital last week for exacerbation of CHF and ascites and underwent a paracentesis No improvement with over the counter medications or other home remedies.  Denies other complaint or health concern today.    Patient Active Problem List   Diagnosis Date Noted  . Shortness of breath 02/10/2015  . 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 Chronic renal insufficiency, stage III (moderate) 01/28/2015  . Type 2 diabetes with complication 97/58/8325  . Acute on chronic left systolic heart failure 49/82/6415  . Rash and nonspecific skin eruption 06/20/2014  . Skin lesion of back 06/20/2014  . Preventative health care 06/20/2014  . Right medial knee pain 05/24/2014  . Right ankle sprain 05/24/2014  . Atrial fibrillation 05/24/2014  . Hyperlipidemia 05/24/2014  . CHF (congestive heart failure) 10/31/2011    Class: Chronic  . Other ascites 10/31/2011    Class: Acute  . DM II (diabetes mellitus, type II), controlled 10/31/2011    Class: Chronic  . 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 Atrial fibrillation     ?need old records: ? GI bleed in past, plus pt tends to "self adjust/manage" his meds w/out regard for MD's guidance, so he has NOT been on anticoagulant for this.  . Coronary artery disease   .  Hypertension   . Chronic systolic heart failure   . High cholesterol   . Pulmonary embolism 1980's or 1990's  . Pneumonia   . 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 Gout   . Chronic renal insufficiency, stage III (moderate)     stage II/III  . Heart murmur   . Chronic bronchitis     "used to get it often; haven't had it for awhile now" (04/12/2013)  . Type II diabetes mellitus   . Postpolio syndrome 1951    "affected the right side of my body; been weak on that side ever since" (04/12/2013)  . Severe mitral regurgitation 2013  . Severe tricuspid regurgitation 2013  . 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 Katy Fitch)    Past Surgical History  Procedure Laterality Date  . Retinal laser procedure Bilateral     "more than once on the left" (04/12/2013)  . Cataract extraction w/ intraocular lens  implant, bilateral Bilateral 2005  . Appendectomy  ~ 1962  . Cardiac catheterization      "I've had a few since 1989" (04/12/2013)  . Cervical discectomy  1984    C6-7  . 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 Exploratory laparotomy  ~ 1962    "took out my appendix while there; bowel was inflammed" (04/12/2013)  . Hydrocele excision / repair      "  couple times" (04/12/2013)  . Transthoracic echocardiogram  2013;12/2014    LV dilation, EF 30-35%, diffuse hypokinesis, grade I diast dysfxn, severe mitral and tricuspid regurg, LAE and RAE--biventricular dysfunction moderate/severe  . Paracentesis  2013-2014    Multiple times, usually pulling off 6-7 liters of fluid  . Cardiac catheterization  12/2012    Severe native CAD, patent bypass grafts x 3, EF 25-30%--med mgmt  . Left and right heart catheterization with coronary/graft angiogram  01/13/2013    Procedure: LEFT AND RIGHT HEART CATHETERIZATION WITH Beatrix Fetters;  Surgeon: Birdie Riddle, MD;  Location: [facility] C[facility];  Service: Cardiovascular;;    History   Substance Use Topics  . Smoking status: Former Smoker -- 0.25 packs/day for 20 years    Types: Cigarettes    Quit date: 10/30/1973  . Smokeless tobacco: Former Systems developer    Types: [place] date: 10/21/1969  . Alcohol Use: No     Comment: 04/12/2013 "have ~ 2 drinks/yr; a drink for me is ~ 0.5oz"    Family History  Problem Relation Age of Onset  . Cancer Sister   . Parkinson's disease Brother     Allergies  Allergen Reactions  . Adhesive [Tape] Other (See Comments)    Pulls skin off. Please use "paper" tape.  . Sulfa Antibiotics Rash    Medication list has been reviewed and updated.  Current Outpatient Prescriptions on File Prior to Visit  Medication Sig Dispense Refill  . Blood Glucose Monitoring Suppl (ONE TOUCH ULTRA SYSTEM KIT) W/DEVICE KIT 1 kit by Does not apply route once. Pt should check his glucose TID 1 each 0  . carvedilol (COREG) 3.125 MG tablet Take 1 tablet (3.125 mg total) by mouth 2 (two) times daily with a meal.    . furosemide (LASIX) 80 MG tablet Take 80 mg by mouth 3 (three) times daily.    Marland Kitchen glucose blood test strip Please check glucose BID, PRN or as directed. 100 each 3  . insulin NPH-regular Human (HUMULIN 70/30) (70-30) 100 UNIT/ML injection 25 U SQ at breakfast and 15 U SQ at supper 10 mL 6  . Insulin Syringe-Needle U-100 (RELION INSULIN SYR 0.3ML/31G) 31G X 5/16" 0.3 ML MISC Please use BID along with insulin. 100 each 11  . Lancets (ONETOUCH ULTRASOFT) lancets Please use TID to check glucose. 100 each 11  . metolazone (ZAROXOLYN) 2.5 MG tablet Take 1 tablet (2.5 mg total) by mouth every other day. 15 tablet 1  . miconazole (MICOTIN) 2 % powder Apply 1 application topically 3 (three) times daily as needed for itching (rash).     Marland Kitchen OVER THE COUNTER MEDICATION Take 2 capsules by mouth every morning. Ultra guard forte.  Multi vitamin in food form    . potassium chloride SA (K-DUR,KLOR-CON) 20 MEQ tablet Take 1-2 tablets (20-40 mEq total) by mouth 2 (two)  times daily. 2 tabs in the morning and 1 tab in the evening    . spironolactone (ALDACTONE) 12.5 mg TABS Take 0.5 tablets (12.5 mg total) by mouth daily. (Patient taking differently: Take 12.5 mg by mouth daily. Pt has instructions to take the 25 mg if his BP >90/60.) 15 tablet 1   No current facility-administered medications on file prior to visit.    Review of Systems:  Review of Systems  Constitutional: Negative for fever, chills and fatigue.  HENT: Negative for congestion, ear pain, hearing loss, postnasal drip, rhinorrhea and sinus pressure.   Eyes: Negative for discharge and  redness.  Respiratory: Negative for cough, shortness of breath and wheezing.   Cardiovascular: Negative for chest pain and leg swelling.  Gastrointestinal: Negative for  constipation and blood in stool.  Genitourinary: Negative for dysuria, urgency and frequency.  Musculoskeletal: Negative for neck stiffness.  Skin: Negative for rash.  Neurological: Negative for seizures, weakness and headaches.   Physical Examination: Filed Vitals:   02/21/15 1420  BP: 106/64  Pulse: 72  Temp: 97.2 F (36.2 C)  Resp: 18   Filed Vitals:   02/21/15 1420  Height: 5' 5"  (1.651 m)  Weight: 135 lb (61.236 kg)   Body mass index is 22.47 kg/(m^2). Ideal Body Weight: Weight in (lb) to have BMI = 25: 149.9  GEN: WDWN, NAD, Non-toxic, A & O x 3 HEENT: Atraumatic, Normocephalic. Neck supple. No masses, No LAD. Ears and Nose: No external deformity. CV: RRR, No M/G/R. No JVD. No thrill. No extra heart sounds. PULM: CTA B, no wheezes, crackles, rhonchi. No retractions. No resp. distress. No accessory muscle use. ABD: S, NT, ND, +BS. No rebound. No HSM.  Umbilical hernia easily reduced EXTR: No c/c/e NEURO Normal gait.  PSYCH: Normally interactive. Conversant. Not depressed or anxious appearing.  Calm demeanor.   Assessment and Plan: Incarcerated umbilical hernia Reduced with pain relief Surgery  consultation  Signed Ellison Carwin, MD   [facility] reading (PRIMARY) by  Dr. Ouida Sills.  No acute disease.  Results for orders placed or performed in visit on 02/21/15  POCT CBC  Result Value Ref Range   WBC 10.1 4.6 - 10.2 K/uL   Lymph, poc 1.3 0.6 - 3.4   POC LYMPH PERCENT 13.2 10 - 50 %L   MID (cbc) 0.5 0 - 0.9   POC MID % 4.9 0 - 12 %M   POC Granulocyte 8.3 (A) 2 - 6.9   Granulocyte percent 81.9 (A) 37 - 80 %G   RBC 5.05 4.69 - 6.13 M/uL   Hemoglobin 14.9 14.1 - 18.1 g/dL   HCT, POC 45.8 43.5 - 53.7 %   MCV 90.6 80 - 97 fL   MCH, POC 29.4 27 - 31.2 pg   MCHC 32.5 31.8 - 35.4 g/dL   RDW, POC 16.5 %   Platelet Count, POC 256 142 - 424 K/uL   MPV 7.4 0 - 99.8 fL  POCT UA - Microscopic Only  Result Value Ref Range   WBC, Ur, HPF, POC 0-2    RBC, urine, microscopic 0-2    Bacteria, U Microscopic neg    Mucus, UA neg    Epithelial cells, urine per micros neg    Crystals, Ur, HPF, POC neg    Casts, Ur, LPF, POC neg    Yeast, UA neg   POCT urinalysis dipstick  Result Value Ref Range   Color, UA yellow    Clarity, UA clear    Glucose, UA neg    Bilirubin, UA neg    Ketones, UA neg    Spec Grav, UA 1.015    Blood, UA neg    pH, UA 7.5    Protein, UA neg    Urobilinogen, UA 0.2    Nitrite, UA neg    Leukocytes, UA Negative   POCT glucose (manual entry)  Result Value Ref Range   POC Glucose 67 (A) 70 - 99 mg/dl

## 2015-02-23 ENCOUNTER — Telehealth: Payer: Self-pay | Admitting: Family Medicine

## 2015-02-23 ENCOUNTER — Encounter: Payer: Self-pay | Admitting: Family Medicine

## 2015-02-23 NOTE — Telephone Encounter (Signed)
I reviewed office note from recent urgent care visit + x-ray of abdomen from that visit. I called pt and left VM: he can call me back on my cell # or if he wants to wait and call me tomorrow at office that is fine. I recommended that he seek care at the closest ED if he is having severe abdominal pain.

## 2015-02-23 NOTE — Telephone Encounter (Signed)
Patient has imbilical hernia. Patient is requesting a referral. Patient mentioned that he has been in contact with [facility] but they will not accept him as a patient as that his health risks are too high.

## 2015-02-23 NOTE — Telephone Encounter (Signed)
Reviewed referrals section of pt's chart and he does have a gen surg referral order from 02/20/15 in there (Dr. Derrell Lollingamirez with [facility]).

## 2015-02-24 ENCOUNTER — Telehealth: Payer: Self-pay

## 2015-02-24 NOTE — Telephone Encounter (Signed)
Noted  

## 2015-02-24 NOTE — Telephone Encounter (Signed)
[facility]ical [facility] and she states that they advised pt to go to ER for immediate treatment to reduce hernia. She states that she did schedule pt apt for 03/07/15 at 2:50pm with Dr. Corliss Skainssuei but was unable to contact pt to let him know. I spoke to pt and he states that he did not go to ER because he did not want to sit in line all night and he was feeling better. I advised him of apt with Dr. Corliss Skainssuei.

## 2015-02-24 NOTE — Telephone Encounter (Signed)
[facility] is calling to let us know that they scheduled an appointment for the patient on May 17 th at 2:50 pm.

## 2015-02-24 NOTE — Telephone Encounter (Signed)
Pls call [facility]. Inquire about reason pt referral was denied. It appears when referral was made by urgent care he was sick and not approp outpt referral. If this was the only reason, pls ask if they would accept him now. I talked to him today and he is completely w/out pain and is eating/drinking w/out nausea or vomiting.  Essentially, this would be a referral for consideration of elective repair of umbillical hernia.-thx

## 2015-03-01 ENCOUNTER — Other Ambulatory Visit: Payer: Self-pay | Admitting: *Deleted

## 2015-03-01 NOTE — Patient Outreach (Signed)
[name]) Care Management   03/01/2015  [name] June 06, 1941 [id number]  [name] is an 74 y.o. male  Subjective:  EDEMA: Pt reports he is doing "much better" since his last discharge from the hospital. Reported all follow up appointments with CAD and primary provider. States swelling has reduce a lot with only a smal amount to his left leg and ankles. Pt aware to use her compression stockings when needed. SAFETY: Reports a new finding of a hernia that he will undergo surgery on 5/17 and pt is very educated on the process and what will be done during the surgery. Note pt's wife is also a Marine scientist and continues to educate his accordingly. Pt reports history of falls  And continues to use her cane if needed. Pt reports being very safe to prevent future falls and injuries.  HF: Pt reports he is in the GREEN zone and continues to weight daily with his morning weight at 135.6 lbs. Pt denies any SOB or symptoms of HF and continues to recover with no acute symptoms. Pt reports his is more aware of the signs or HF and declines further educational information on this disease but receptive to the ongoing goals discussed and reviewed today.   Objective:   Review of Systems  Constitutional: Negative.   HENT: Negative.   Eyes: Negative.   Respiratory: Positive for cough.        Some rhonchi to his left lower lobe upon asculation.  Cardiovascular: Positive for leg swelling.  Gastrointestinal: Negative.   Genitourinary: Negative.   Musculoskeletal: Negative.   Skin: Negative.   Neurological: Negative.   Endo/Heme/Allergies: Negative.   Psychiatric/Behavioral: Negative.     Physical Exam  Constitutional: He is oriented to person, place, and time. He appears well-developed and well-nourished.  HENT:  Right Ear: External ear normal.  Left Ear: External ear normal.  Nose: Nose normal.  Neck: Normal range of motion.  Cardiovascular: Normal rate, regular rhythm, normal  heart sounds and intact distal pulses.   Respiratory: Effort normal and breath sounds normal.  GI: Soft. Bowel sounds are normal.  Musculoskeletal: He exhibits edema.  Left lower leg with trace edema  Neurological: He is alert and oriented to person, place, and time.  Skin: Skin is warm and dry.  Psychiatric: He has a normal mood and affect. His behavior is normal. Judgment and thought content normal.    Current Medications:   Current Outpatient Prescriptions  Medication Sig Dispense Refill  . Blood Glucose Monitoring Suppl (ONE TOUCH ULTRA SYSTEM KIT) W/DEVICE KIT 1 kit by Does not apply route once. Pt should check his glucose TID 1 each 0  . carvedilol (COREG) 3.125 MG tablet Take 1 tablet (3.125 mg total) by mouth 2 (two) times daily with a meal.    . furosemide (LASIX) 80 MG tablet Take 80 mg by mouth 3 (three) times daily.    Marland Kitchen glucose blood test strip Please check glucose BID, PRN or as directed. 100 each 3  . insulin NPH-regular Human (HUMULIN 70/30) (70-30) 100 UNIT/ML injection 25 U SQ at breakfast and 15 U SQ at supper 10 mL 6  . Insulin Syringe-Needle U-100 (RELION INSULIN SYR 0.3ML/31G) 31G X 5/16" 0.3 ML MISC Please use BID along with insulin. 100 each 11  . Lancets (ONETOUCH ULTRASOFT) lancets Please use TID to check glucose. 100 each 11  . metolazone (ZAROXOLYN) 2.5 MG tablet Take 1 tablet (2.5 mg total) by mouth every other day. 15 tablet 1  .  miconazole (MICOTIN) 2 % powder Apply 1 application topically 3 (three) times daily as needed for itching (rash).     Marland Kitchen OVER THE COUNTER MEDICATION Take 2 capsules by mouth every morning. Ultra guard forte.  Multi vitamin in food form    . potassium chloride SA (K-DUR,KLOR-CON) 20 MEQ tablet Take 1-2 tablets (20-40 mEq total) by mouth 2 (two) times daily. 2 tabs in the morning and 1 tab in the evening    . spironolactone (ALDACTONE) 12.5 mg TABS Take 0.5 tablets (12.5 mg total) by mouth daily. (Patient taking differently: Take 12.5 mg by  mouth daily. Pt has instructions to take the 25 mg if his BP >90/60.) 15 tablet 1   No current facility-administered medications for this visit.    Functional Status:   In your present state of health, do you have any difficulty performing the following activities: 02/10/2015 02/02/2015  Hearing? N N  Vision? Y N  Difficulty concentrating or making decisions? N N  Walking or climbing stairs? Y N  Dressing or bathing? N N  Doing errands, shopping? N N  Preparing Food and eating ? - N  Using the Toilet? - N  In the past six months, have you accidently leaked urine? - N  Do you have problems with loss of bowel control? - N  Managing your Medications? - N  Managing your Finances? - N  Housekeeping or managing your Housekeeping? - N    Fall/Depression Screening:    PHQ 2/9 Scores 02/02/2015  PHQ - 2 Score 0    Assessment:   Ongoing case management related to HF (post-op discharge) Follow up ongoing swelling to lower extremities Safety related to fall history and recent hernia diagnosis  Plan:  Will reiterated on the symptoms of HF and verify pt remains in the GREEN zone with no acute symptoms that have occurred since his last discharge.  Teach-back method completed as pt able to recite some of the signs of HF and what to do if acute. Will continue to allow an extension on pt's plan of care and goals to allow adhere with pt's knowledge base on HF.   Will verified pt continues to evaluate his bilateral lower extremities to reduce ongoing swelling and will continue to encouraged pt to wear his compressions stocking when need to reduce ongoing edema.  Will verified pt's remain safe with precautionary measures to prevent falls and injuries. Will also offer educational videos or printable material concerning pt's upcoming surgery for his hernia repair however pt is very educated on this matter and decline further counseling or education. Also offered any community resources needed at this time  as again pt declined indicated he has his wife for the needed assistance.  Plan to follow up in one month with ongoing case management services and re-evaluate pt's progress concerning the plan of care and current goals in place. Pt is progressing very well since his last hospitalization and continues to follow all instructions from his providers and adheres to taking all her prescribed medications. Will follow up accordingly with a home visit next month however will continue weekly transition of care contact accordingly to policy.  Raina Mina, RN Care Management Coordinator [facility] Main Office [phone]

## 2015-03-07 ENCOUNTER — Ambulatory Visit: Payer: Self-pay | Admitting: Surgery

## 2015-03-07 NOTE — H&P (Signed)
History of Present Illness [name]. Francisco Hissong Mueller; 03/07/2015 3:41 PM) Patient words: umb hernia.  The patient is a 74 year old male who presents with an umbilical hernia. Referred by [name]/ [name] for umbilical hernia This is a 74 yo male with multiple medical comorbidities, including chronic CHF, CAD, DM2, dilated cardiomyopathy and chronic ascites who presents with a 2 week history of a symptomatic umbilical hernia. The patient states that he never noticed a problem with his umbilicus but awoke on 12/22/17 with severe pain and swelling at his umbilicus. He was seen urgently by his family physician [name] who was able to reduce the hernia. The patient reports no GI obstructive symptoms. He does still have some discomfort associated with the hernia. The patient was discharged from the hospital on 02/14/15 with exacerbation of his chronic congestive heart failure. He is scheduled to see his cardiologist [name] next week. The patient is referred for evaluation for hernia repair. The patient had a rough experience with general anesthesia at his last shoulder surgery in the 1990s. The patient has had a slowly enlarging cyst on his scalp for several years. If he is to undergo surgery, he would like to have this removed at the same time. Other Problems [name], CMA; 03/07/2015 2:31 PM) Arthritis Congestive Heart Failure Diabetes Mellitus Umbilical Hernia Repair  Past Surgical History [name], CMA; 03/07/2015 2:31 PM) Appendectomy Coronary Artery Bypass Graft Resection of Stomach Spinal Surgery - Neck  Allergies [name], CMA; 03/07/2015 2:32 PM) Sulfa 10 *OPHTHALMIC AGENTS* Adhesive Tape *MEDICAL DEVICES AND SUPPLIES*  Medication History [name], CMA; 03/07/2015 2:36 PM) Coreg (3.125MG Tablet, Oral) Active. Lasix (80MG Tablet, Oral) Active. HumuLIN 70/30 Pen ((70-30) 100UNIT/ML Susp Pen-inj, Subcutaneous) Active. Metolazone  (2.5MG Tablet, Oral) Active. Miconazole Nitrate (2% Powder, External) Active. Potassium Chloride (20MEQ Tablet ER, Oral) Active. Spironolactone (25MG Tablet, Oral) Active. Blood Glucose Monitor System (w/Device Kit,) Active. Insulin Syringe 0.3cc Active. Lancets Active. Medications Reconciled  Social History [name], [place]; 03/07/2015 2:31 PM) Alcohol use Occasional alcohol use. Caffeine use Tea. Tobacco use Former smoker.  Family History [name], [place]; 03/07/2015 2:31 PM) Breast Cancer Sister.     Review of Systems [name] CMA; 03/07/2015 2:31 PM) General Present- Weight Loss. Not Present- Appetite Loss, Chills, Fatigue, Fever, Night Sweats and Weight Gain. HEENT Present- Wears glasses/contact lenses. Not Present- Earache, Hearing Loss, Hoarseness, Nose Bleed, Oral Ulcers, Ringing in the Ears, Seasonal Allergies, Sinus Pain, Sore Throat, Visual Disturbances and Yellow Eyes. Cardiovascular Not Present- Chest Pain, Difficulty Breathing Lying Down, Leg Cramps, Palpitations, Rapid Heart Rate, Shortness of Breath and Swelling of Extremities. Gastrointestinal Not Present- Abdominal Pain, Bloating, Bloody Stool, Change in Bowel Habits, Chronic diarrhea, Constipation, Difficulty Swallowing, Excessive gas, Gets full quickly at meals, Hemorrhoids, Indigestion, Nausea, Rectal Pain and Vomiting. Male Genitourinary Present- Frequency. Not Present- Blood in Urine, Change in Urinary Stream, Impotence, Nocturia, Painful Urination, Urgency and Urine Leakage.  Vitals [name] CMA; 03/07/2015 2:37 PM) 03/07/2015 2:36 PM Weight: 140 lb Height: 65in Body Surface Area: 1.71 m Body Mass Index: 23.3 kg/m Temp.: 99.51F(Oral)  Pulse: 73 (Regular)  Resp.: 18 (Unlabored)  BP: 130/70 (Sitting, Left Arm, Standard)     Physical Exam Francisco Key K. Nafisah Runions Mueller; 03/07/2015 3:44 PM)  The physical exam findings are as follows: Note:WDWN in NAD HEENT: EOMI, sclera  anicteric Scalp - anterior left - 2 cm protruding subcutaneous mass; not infected; well-demarcated Neck: No masses, no thyromegaly Lungs: CTA bilaterally; normal  respiratory effort CV: Regular rate and rhythm; no murmurs Abd: +bowel sounds, soft, non-tender, protuberant soft abdomen; protruding umbilical hernia - reducible - defect measures at least 3 cm across Ext: Well-perfused; no edema Skin: Warm, dry; no sign of jaundice    Assessment & Plan [name]. [name]; 1/79/1505 6:97 PM)  UMBILICAL HERNIA WITHOUT OBSTRUCTION AND WITHOUT GANGRENE (553.1  K42.9)  SEBACEOUS CYST (706.2  L72.3) Story: scalp - 2 cm  Current Plans Schedule for Surgery - Umbilical hernia repair with mesh/ excision of sebaceous cyst of the scalp - The surgical procedure has been discussed with the patient. Potential risks, benefits, alternative treatments, and expected outcomes have been explained. All of the patient's questions at this time have been answered. The likelihood of reaching the patient's treatment goal is good. The patient understand the proposed surgical procedure and wishes to proceed. Note:Cardiac clearance - [name]  The patient is at elevated risk for surgery because of his comorbidities, but he is also at risk for incarceration and strangulation if the hernia is not repaired. We will also excise the sebaceous cyst from his scalp if he undergoes surgery.   [name]. Georgette Dover, Mueller, [facility]  General/ Trauma Surgery  03/07/2015 3:46 PM

## 2015-03-17 ENCOUNTER — Encounter: Payer: Self-pay | Admitting: Family Medicine

## 2015-03-27 ENCOUNTER — Other Ambulatory Visit: Payer: Self-pay | Admitting: *Deleted

## 2015-03-27 MED ORDER — ONETOUCH ULTRASOFT LANCETS MISC: Status: DC

## 2015-03-27 MED ORDER — INSULIN NPH ISOPHANE & REGULAR (70-30) 100 UNIT/ML ~~LOC~~ SUSP: SUBCUTANEOUS | Status: DC

## 2015-03-27 NOTE — Telephone Encounter (Signed)
Pt called requesting refill for insulin and lancets. LOV 02/14/15, up coming ov 7/25/216, last written: 01/19/15 w 6RF ([name] [address] per pt request.

## 2015-03-28 ENCOUNTER — Other Ambulatory Visit: Payer: Self-pay | Admitting: *Deleted

## 2015-03-28 ENCOUNTER — Telehealth: Payer: Self-pay | Admitting: *Deleted

## 2015-03-28 NOTE — Patient Outreach (Signed)
[facility]) Care Management   03/28/2015  [name] [birth year] [id number]  [name] is an 74 y.o. male  Subjective:  HF: Pt reports he continues to manage his HF with no problems mentioned. States recent follow up visits and delays concerning his hernia due to his medical issues however pt within pain and reports he remains in the GREEN zone concerning his HF zone today. Reports his recent weights today and yesterday with minimal weight gail. EDEMA: Pt reports some swelling to his lower however aware when he needs to apply his compression stockings when needed.  RESPIRATORY: Pt reports a productive cough however minimal with sputum. Denies any SOB or difficulty breathing and no fevers, chills or GI upset. Pt will obtain OTC medicine to assist with his thick secretions. Objective:   Review of Systems  Constitutional: Negative.   HENT: Negative.   Eyes: Negative.   Respiratory: Positive for sputum production.        RRL with rhonic upon auscultation.  Cardiovascular: Negative.   Gastrointestinal: Negative.   Genitourinary: Negative.   Musculoskeletal: Negative.   Skin: Negative.   Neurological: Negative.   Endo/Heme/Allergies: Negative.   Psychiatric/Behavioral: Negative.     Physical Exam  Constitutional: He is oriented to person, place, and time. He appears well-developed and well-nourished.  HENT:  Right Ear: External ear normal.  Left Ear: External ear normal.  Nose: Nose normal.  Neck: Normal range of motion.  Cardiovascular: Normal rate and normal heart sounds.   Respiratory: Effort normal and breath sounds normal.  GI: Soft. Bowel sounds are normal.  Musculoskeletal: Normal range of motion.  Neurological: He is alert and oriented to person, place, and time.  Skin: Skin is warm and dry.  Psychiatric: He has a normal mood and affect. His behavior is normal. Judgment and thought content normal.    Current Medications:   Current Outpatient  Prescriptions  Medication Sig Dispense Refill  . Blood Glucose Monitoring Suppl (ONE TOUCH ULTRA SYSTEM KIT) W/DEVICE KIT 1 kit by Does not apply route once. Pt should check his glucose TID 1 each 0  . carvedilol (COREG) 3.125 MG tablet Take 1 tablet (3.125 mg total) by mouth 2 (two) times daily with a meal.    . furosemide (LASIX) 80 MG tablet Take 80 mg by mouth 3 (three) times daily.    Marland Kitchen glucose blood test strip Please check glucose BID, PRN or as directed. 100 each 3  . insulin NPH-regular Human (HUMULIN 70/30) (70-30) 100 UNIT/ML injection 25 U SQ at breakfast and 15 U SQ at supper 10 mL 6  . Insulin Syringe-Needle U-100 (RELION INSULIN SYR 0.3ML/31G) 31G X 5/16" 0.3 ML MISC Please use BID along with insulin. 100 each 11  . Lancets (ONETOUCH ULTRASOFT) lancets Please use TID to check glucose. Dx: E11.8 100 each 11  . metolazone (ZAROXOLYN) 2.5 MG tablet Take 1 tablet (2.5 mg total) by mouth every other day. 15 tablet 1  . miconazole (MICOTIN) 2 % powder Apply 1 application topically 3 (three) times daily as needed for itching (rash).     Marland Kitchen OVER THE COUNTER MEDICATION Take 2 capsules by mouth every morning. Ultra guard forte.  Multi vitamin in food form    . potassium chloride SA (K-DUR,KLOR-CON) 20 MEQ tablet Take 1-2 tablets (20-40 mEq total) by mouth 2 (two) times daily. 2 tabs in the morning and 1 tab in the evening    . spironolactone (ALDACTONE) 12.5 mg TABS Take 0.5 tablets (12.5 mg  total) by mouth daily. (Patient taking differently: Take 12.5 mg by mouth daily. Pt has instructions to take the 25 mg if his BP >90/60.) 15 tablet 1   No current facility-administered medications for this visit.    Functional Status:   In your present state of health, do you have any difficulty performing the following activities: 03/28/2015 02/10/2015  Hearing? N N  Vision? N Y  Difficulty concentrating or making decisions? N N  Walking or climbing stairs? N Y  Dressing or bathing? N N  Doing errands,  shopping? N N  Preparing Food and eating ? N -  Using the Toilet? N -  In the past six months, have you accidently leaked urine? N -  Do you have problems with loss of bowel control? N -  Managing your Medications? N -  Managing your Finances? N -  Housekeeping or managing your Housekeeping? N -    Fall/Depression Screening:    PHQ 2/9 Scores 03/28/2015 02/02/2015  PHQ - 2 Score 0 0    Assessment:   Ongoing case management related to HF Ongoing bilateral edema related to lower legs Potential risk for infection related to productive cough   Plan:  Physical assessment completed with no acute symptoms found today. Will continue to encouraged ongoing self management of care concerning his HF and verified pt remains in the GREEN zone with ongoing daily weights. Verified pt is documenting all readings for his providers to view upon office visits.  Will continue to encouraged pt to adhere with managing his HF and administering his prescribed medications.  Strongly encouraged pt to elevate his lower legs and/or wear his ongoing compression stockings to reduce ongoing swelling around pt's ankles and lower legs. Currently pt with 1+edema noted to his ankles.  Discussed home remedies (humidifier) or OTC medicine to assist with productive cough (rhonic) to assist with the thick secretions found today upon RN's assessment. Verified no fevers or chills and pt remains within symptoms. Plan to follow up in one month  With ongoing case management services and continue to provider available services related to pt's managing his medical issues. Discussed pt's plan of care and extended goals in place as pt receptive to a potential discharge if he continues to progress with his goals. Will follow up in one month with home visit to re-evaluate. No other inquires or questions presented at this time.   Raina Mina, RN Care Management Coordinator [facility] Main Office [phone]

## 2015-03-28 NOTE — Telephone Encounter (Signed)
Pt called stating that he would rather have Novolin sent in because it his cheaper than the Humulin. He stated that he is going to be out soon so will go ahead and get rx that was sent for Humulin this one time but would like future Rx for his insulin to be Novolin. Please review and advise. Thanks.

## 2015-04-27 ENCOUNTER — Encounter: Payer: Self-pay | Admitting: *Deleted

## 2015-04-27 ENCOUNTER — Other Ambulatory Visit: Payer: Self-pay | Admitting: *Deleted

## 2015-04-27 NOTE — Patient Outreach (Signed)
[facility] Care Management   04/27/2015  Erma Sancho 02/20/1941 [id number]  Khaden Labriola is an 74 y.o. male  Subjective:  HF: Pt reports he continues to weight daily and document all weights staying within his limited. States no issues have occurred and he remains in the GREEN zone with no symptoms. Pt very aware of the HF zones with an understanding and able to alert his providers when needed with any encountered symptoms.  EDEMA: Pt states he is without swelling today and has not swelling to his ankles or trunk area.  "Much reduced since last month" states pt. No other issues reported or mentioned at this time. PRODUCTIVE COUGH: Pt states much improved with ongoing use of the Mucinex however continues to have clear to white sputum with no fevers, chills or symptoms related. Pt aware to seek medical attention if symptoms gets worse and states his wife has been monitoring his chest congestion when needed.  Pt feels he is prepared to be discharged from the [facility] program and states he believes he has all the printed information needed to continue monitoring his medical problem. Pt receptive to being discharged from the [facility] program today.   Objective:   Review of Systems  Constitutional: Negative.   HENT: Negative.   Eyes: Negative.   Respiratory: Positive for sputum production.        Coloration reported clear  Cardiovascular: Negative.   Gastrointestinal: Negative.   Genitourinary: Negative.   Musculoskeletal: Negative.   Skin: Negative.   Neurological: Negative.   Endo/Heme/Allergies: Negative.   Psychiatric/Behavioral: Negative.     Physical Exam  Constitutional: He is oriented to person, place, and time. He appears well-developed and well-nourished.  HENT:  Right Ear: External ear normal.  Left Ear: External ear normal.  Nose: Nose normal.  Neck: Normal range of motion.  Cardiovascular: Normal rate, regular rhythm and normal heart sounds.   Respiratory:  Effort normal.  Left lower lobe with rhonic  GI: Soft. Bowel sounds are normal.  Musculoskeletal: Normal range of motion.  Neurological: He is alert and oriented to person, place, and time.  Skin: Skin is warm and dry.  Psychiatric: He has a normal mood and affect. His behavior is normal. Judgment and thought content normal.    Current Medications:   Current Outpatient Prescriptions  Medication Sig Dispense Refill  . Blood Glucose Monitoring Suppl (ONE TOUCH ULTRA SYSTEM KIT) W/DEVICE KIT 1 kit by Does not apply route once. Pt should check his glucose TID 1 each 0  . carvedilol (COREG) 3.125 MG tablet Take 1 tablet (3.125 mg total) by mouth 2 (two) times daily with a meal.    . furosemide (LASIX) 80 MG tablet Take 80 mg by mouth 3 (three) times daily.    . glucose blood test strip Please check glucose BID, PRN or as directed. 100 each 3  . insulin NPH-regular Human (HUMULIN 70/30) (70-30) 100 UNIT/ML injection 25 U SQ at breakfast and 15 U SQ at supper 10 mL 6  . Insulin Syringe-Needle U-100 (RELION INSULIN SYR 0.3ML/31G) 31G X 5/16" 0.3 ML MISC Please use BID along with insulin. 100 each 11  . Lancets (ONETOUCH ULTRASOFT) lancets Please use TID to check glucose. Dx: E11.8 100 each 11  . metolazone (ZAROXOLYN) 2.5 MG tablet Take 1 tablet (2.5 mg total) by mouth every other day. 15 tablet 1  . miconazole (MICOTIN) 2 % powder Apply 1 application topically 3 (three) times daily as needed for itching (rash).     .   OVER THE COUNTER MEDICATION Take 2 capsules by mouth every morning. Ultra guard forte.  Multi vitamin in food form    . potassium chloride SA (K-DUR,KLOR-CON) 20 MEQ tablet Take 1-2 tablets (20-40 mEq total) by mouth 2 (two) times daily. 2 tabs in the morning and 1 tab in the evening    . spironolactone (ALDACTONE) 12.5 mg TABS Take 0.5 tablets (12.5 mg total) by mouth daily. (Patient taking differently: Take 12.5 mg by mouth daily. Pt has instructions to take the 25 mg if his BP  >90/60.) 15 tablet 1   No current facility-administered medications for this visit.    Functional Status:   In your present state of health, do you have any difficulty performing the following activities: 04/27/2015 03/28/2015  Hearing? N N  Vision? N N  Difficulty concentrating or making decisions? N N  Walking or climbing stairs? N N  Dressing or bathing? N N  Doing errands, shopping? N N  Preparing Food and eating ? N N  Using the Toilet? N N  In the past six months, have you accidently leaked urine? N N  Do you have problems with loss of bowel control? N N  Managing your Medications? N N  Managing your Finances? N N  Housekeeping or managing your Housekeeping? N N    Fall/Depression Screening:    PHQ 2/9 Scores 04/27/2015 03/28/2015 02/02/2015  PHQ - 2 Score 0 0 0    Assessment:  Follow up on disease management related to HF Follow up on bilateral edema to lower extremities Follow up on productive cough related to minimal congestion  Plan:  Physical assessment completed with no acute findings.  Pt continues to weight daily and monitor his weight by documenting all readings for providers viewing. Verified pt remains in the GREEN zone with no additional issues encountered. Verifies pt feels comfortable managing his HF and aware of his parameters with medications as instructed by his providers.  Verified pt's awareness on when to apply his compression stockings for ongoing swelling to his bilateral legs. Also verified pt is without any edema today to all extremities and trunk area. Will continue to encouraged adherence with compression stockings when needed. Will educate and verify pt's awareness on discoloration to sputum and what to do if acute symptoms occur with infections. Note pt's wife is a Marine scientist and assist pt with managing his care.  No other inquires or request at this time as RN discussed possible graduating from the [facility] program and services based upon his progress over the last  few months. Pt receptive to graduating with no additional inquired and feels comfortable in managing his care independently. No additional needs with all goals met and a successful plan of care completed with pt understanding. Case will be closed.   Raina Mina, RN Care Management Coordinator [facility] Main Office [phone]

## 2015-05-08 ENCOUNTER — Other Ambulatory Visit: Payer: Self-pay | Admitting: *Deleted

## 2015-05-08 MED ORDER — ONETOUCH DELICA LANCETS 33G MISC: Status: DC

## 2015-05-15 ENCOUNTER — Ambulatory Visit (INDEPENDENT_AMBULATORY_CARE_PROVIDER_SITE_OTHER): Payer: PPO | Admitting: Family Medicine

## 2015-05-15 ENCOUNTER — Encounter: Payer: Self-pay | Admitting: Family Medicine

## 2015-05-15 VITALS — BP 111/74 | HR 75 | Temp 97.6°F | Resp 16 | Ht 65.0 in | Wt 134.0 lb

## 2015-05-15 DIAGNOSIS — I482 Chronic atrial fibrillation, unspecified: Secondary | ICD-10-CM

## 2015-05-15 DIAGNOSIS — I509 Heart failure, unspecified: Secondary | ICD-10-CM | POA: Diagnosis not present

## 2015-05-15 DIAGNOSIS — R946 Abnormal results of thyroid function studies: Secondary | ICD-10-CM

## 2015-05-15 DIAGNOSIS — IMO0002 Reserved for concepts with insufficient information to code with codable children: Secondary | ICD-10-CM

## 2015-05-15 DIAGNOSIS — N183 Chronic kidney disease, stage 3 unspecified: Secondary | ICD-10-CM

## 2015-05-15 DIAGNOSIS — K761 Chronic passive congestion of liver: Secondary | ICD-10-CM

## 2015-05-15 DIAGNOSIS — E1139 Type 2 diabetes mellitus with other diabetic ophthalmic complication: Secondary | ICD-10-CM | POA: Diagnosis not present

## 2015-05-15 DIAGNOSIS — R7989 Other specified abnormal findings of blood chemistry: Secondary | ICD-10-CM

## 2015-05-15 DIAGNOSIS — I5082 Biventricular heart failure: Secondary | ICD-10-CM

## 2015-05-15 DIAGNOSIS — E1165 Type 2 diabetes mellitus with hyperglycemia: Principal | ICD-10-CM

## 2015-05-15 LAB — COMPREHENSIVE METABOLIC PANEL
ALK PHOS: 112 U/L (ref 39–117)
ALT: 14 U/L (ref 0–53)
AST: 26 U/L (ref 0–37)
Albumin: 4 g/dL (ref 3.5–5.2)
BUN: 68 mg/dL — AB (ref 6–23)
CHLORIDE: 91 meq/L — AB (ref 96–112)
CO2: 29 mEq/L (ref 19–32)
Calcium: 9.9 mg/dL (ref 8.4–10.5)
Creatinine, Ser: 1.46 mg/dL (ref 0.40–1.50)
GFR: 50.01 mL/min — ABNORMAL LOW (ref >60.00)
GLUCOSE: 98 mg/dL (ref 70–99)
POTASSIUM: 3.9 meq/L (ref 3.5–5.1)
SODIUM: 132 meq/L — AB (ref 135–145)
Total Bilirubin: 1.4 mg/dL — ABNORMAL HIGH (ref 0.2–1.2)
Total Protein: 8.4 g/dL — ABNORMAL HIGH (ref 6.0–8.3)

## 2015-05-15 LAB — TSH: TSH: 5.79 u[IU]/mL — AB (ref 0.35–4.50)

## 2015-05-15 LAB — HEMOGLOBIN A1C: Hgb A1c MFr Bld: 8.4 % — ABNORMAL HIGH (ref 4.6–6.5)

## 2015-05-15 MED ORDER — INSULIN NPH ISOPHANE & REGULAR (70-30) 100 UNIT/ML ~~LOC~~ SUSP: SUBCUTANEOUS | Status: DC

## 2015-05-15 NOTE — Telephone Encounter (Signed)
Done during o/v today.

## 2015-05-15 NOTE — Progress Notes (Signed)
OFFICE VISIT  05/15/2015   CC:  Chief Complaint  Patient presents with  . Follow-up    4 week f/u. Pt is fastng.      HPI:    Patient is a 74 y.o. 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 Gen surgeon wants to repair this with mesh b/c of high risk of incarceration/strangulation.  Has to have cardiac clearance first. He will be seeing his cardiologist, Dr. Doylene Canard, tomorrow to discuss this further.  Pt is apprehensive about having general anesthesia.  DM: "overall improving", 115 this morning.   Typical morning 70/30 dose is 25, typical suppertime 70/30 dose is 10-15 U.  Cardiac: denies swelling in abd or extremities. Na and fluid restrictions known by pt.  Denies CP.  Denies palpitations.  ROS: no melena/hematochezia, no dizziness, no abdominal pain.     Past Medical History  Diagnosis Date  . Atrial fibrillation     ?GI bleed in past, plus pt tends to "self adjust/manage" his meds w/out regard for MD's guidance, so he has NOT been on anticoagulant for this.  . Coronary artery disease   . Hypertension   . Chronic systolic heart failure   . High cholesterol   . Pulmonary embolism 1980's or 1990's  . Pneumonia   . 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 Gout   . Chronic renal insufficiency, stage III (moderate)     stage II/III  . Heart murmur   . Chronic bronchitis     "used to get it often; haven't had it for awhile now" (04/12/2013)  . Type II diabetes mellitus   . Postpolio syndrome 1951    "affected the right side of my body; been weak on that side ever since" (04/12/2013)  . Severe mitral regurgitation 2013  . Severe tricuspid regurgitation 2013  . 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 Katy Fitch)  . Umbilical hernia 05/164    Symptomatic--Dr.  Georgette Dover to repair if cleared by cardiologist    Past Surgical History  Procedure Laterality Date  . Retinal laser procedure Bilateral     "more than once on the left" (04/12/2013)  . Cataract extraction w/ intraocular lens  implant, bilateral Bilateral 2005  . Appendectomy  ~ 1962  . Cardiac catheterization      "I've had a few since 1989" (04/12/2013)  . Cervical discectomy  1984    C6-7  . 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 Exploratory laparotomy  ~ 1962    "took out my appendix while there; bowel was inflammed" (04/12/2013)  . Hydrocele excision / repair      "couple times" (04/12/2013)  . Transthoracic echocardiogram  2013;12/2014    LV dilation, EF 30-35%, diffuse hypokinesis, grade I diast dysfxn, severe mitral and tricuspid regurg, LAE and RAE--biventricular dysfunction moderate/severe  . Paracentesis  2013-2014    Multiple times, usually pulling off 6-7 liters of fluid  . Cardiac catheterization  12/2012    Severe native CAD, patent bypass grafts x 3, EF 25-30%--med mgmt  . Left and right heart catheterization with coronary/graft angiogram  01/13/2013    Procedure: LEFT AND RIGHT HEART CATHETERIZATION WITH Beatrix Fetters;  Surgeon: Birdie Riddle, MD;  Location: [facility];  Service: Cardiovascular;;    Outpatient Prescriptions Prior to Visit  Medication Sig Dispense Refill  . Blood Glucose Monitoring Suppl (ONE TOUCH ULTRA SYSTEM KIT) W/DEVICE KIT 1 kit by Does not apply route once. Pt should check his glucose TID 1 each 0  . carvedilol (COREG) 3.125 MG tablet Take 1 tablet (3.125 mg total) by mouth 2 (two) times daily with a meal.    . furosemide (LASIX) 80 MG tablet Take 80 mg by mouth 3 (three) times daily.    Marland Kitchen glucose blood test strip Please check glucose BID, PRN or as directed. 100 each 3  . insulin NPH-regular Human (HUMULIN 70/30) (70-30) 100 UNIT/ML injection 25 U SQ at breakfast and 15 U SQ at supper 10 mL 6  . Insulin  Syringe-Needle U-100 (RELION INSULIN SYR 0.3ML/31G) 31G X 5/16" 0.3 ML MISC Please use BID along with insulin. 100 each 11  . metolazone (ZAROXOLYN) 2.5 MG tablet Take 1 tablet (2.5 mg total) by mouth every other day. 15 tablet 1  . miconazole (MICOTIN) 2 % powder Apply 1 application topically 3 (three) times daily as needed for itching (rash).     Glory Rosebush DELICA LANCETS 29B MISC Check blood sugar three times a day 100 each 11  . OVER THE COUNTER MEDICATION Take 2 capsules by mouth every morning. Ultra guard forte.  Multi vitamin in food form    . potassium chloride SA (K-DUR,KLOR-CON) 20 MEQ tablet Take 1-2 tablets (20-40 mEq total) by mouth 2 (two) times daily. 2 tabs in the morning and 1 tab in the evening    . spironolactone (ALDACTONE) 12.5 mg TABS Take 0.5 tablets (12.5 mg total) by mouth daily. (Patient taking differently: Take 12.5 mg by mouth daily. Pt has instructions to take the 25 mg if his BP >90/60.) 15 tablet 1   No facility-administered medications prior to visit.    Allergies  Allergen Reactions  . Adhesive [Tape] Other (See Comments)    Pulls skin off. Please use "paper" tape.  . Sulfa Antibiotics Rash    ROS As per HPI  PE: Blood pressure 111/74, pulse 75, temperature 97.6 F (36.4 C), temperature source Temporal, resp. rate 16, height _0  (1.651 m), weight 134 lb (60.782 kg), SpO2 99 %. Gen: Alert, well appearing.  Patient is oriented to person, place, time, and situation. CV: Irreg irreg, rate about 75, 1-2/6 syst ejection murmur at L and R USB regions. No rub or gallop. Chest is clear, no wheezing or rales. Normal symmetric air entry throughout both lung fields. No chest wall deformities or tenderness. EXT: no clubbing, cyanosis, or edema.    LABS:   Lab Results  Component Value Date   HGBA1C 8.5* 12/26/2014    Lab Results  Component Value Date   TSH 7.370* 10/31/2011   Lab Results  Component Value Date   WBC 10.1 02/21/2015   HGB 14.9  02/21/2015   HCT 45.8 02/21/2015   MCV 90.6 02/21/2015   PLT 188.0 02/17/2015   Lab Results  Component Value Date   CREATININE 1.26 02/17/2015   BUN 54* 02/17/2015   NA 130* 02/17/2015   K 4.8 02/17/2015   CL 92* 02/17/2015   CO2 29 02/17/2015   Lab Results  Component Value Date   ALT 28 02/10/2015   AST 52* 02/10/2015   ALKPHOS 166* 02/10/2015   BILITOT 3.1* 02/10/2015   Lab Results  Component Value Date   CHOL 198 07/04/2014   Lab Results  Component Value  Date   HDL 40.40 07/04/2014   Lab Results  Component Value Date   LDLCALC 139* 07/04/2014   Lab Results  Component Value Date   TRIG 95.0 07/04/2014   Lab Results  Component Value Date   CHOLHDL 5 07/04/2014   IMPRESSION AND PLAN:  1) DM 2, poor control but improving (?). Pt with hx of noncompliance with meds. Check HbA1c today.  He prefers novolin 70/30 so I d/c'd his humulin 70/30 today and rx'd generic novolin 70/30, no change in dosing.  2) Cardiac: Cardiomyopathy (biventricular failure, EF about 30-35%), with chronic a-fib (not anticoag candidate due to hx of bleeding + medication "self dosing" habits).  All seems stable on this front.  Has f/u with Dr. Doylene Canard tomorrow and he'll see if any testing is required before clearing him for umbillical hernia surgery recommended by gen surg.  3) CRI, stage II/II: recheck lytes/cr today.  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 Recheck hepatic panel today, esp with pt being on statin.  5) Hx of elevated TSH: recheck TSH today.  An After Visit Summary was printed and given to the patient.  FOLLOW UP: No Follow-up on file.

## 2015-05-15 NOTE — Progress Notes (Signed)
Pre visit review using our clinic review tool, if applicable. No additional management support is needed unless otherwise documented below in the visit note. 

## 2015-05-16 ENCOUNTER — Encounter: Payer: Self-pay | Admitting: Family Medicine

## 2015-05-16 ENCOUNTER — Other Ambulatory Visit: Payer: Self-pay | Admitting: Family Medicine

## 2015-05-16 DIAGNOSIS — R7989 Other specified abnormal findings of blood chemistry: Secondary | ICD-10-CM

## 2015-05-17 ENCOUNTER — Other Ambulatory Visit (INDEPENDENT_AMBULATORY_CARE_PROVIDER_SITE_OTHER): Payer: PPO

## 2015-05-17 DIAGNOSIS — R946 Abnormal results of thyroid function studies: Secondary | ICD-10-CM

## 2015-05-17 DIAGNOSIS — R7989 Other specified abnormal findings of blood chemistry: Secondary | ICD-10-CM

## 2015-05-17 LAB — T4, FREE: FREE T4: 1.45 ng/dL (ref 0.60–1.60)

## 2015-11-15 DIAGNOSIS — I5022 Chronic systolic (congestive) heart failure: Secondary | ICD-10-CM | POA: Diagnosis not present

## 2015-11-15 DIAGNOSIS — E784 Other hyperlipidemia: Secondary | ICD-10-CM | POA: Diagnosis not present

## 2015-11-15 DIAGNOSIS — E1165 Type 2 diabetes mellitus with hyperglycemia: Secondary | ICD-10-CM | POA: Diagnosis not present

## 2015-11-15 DIAGNOSIS — R188 Other ascites: Secondary | ICD-10-CM | POA: Diagnosis not present

## 2016-02-07 DIAGNOSIS — I361 Nonrheumatic tricuspid (valve) insufficiency: Secondary | ICD-10-CM | POA: Diagnosis not present

## 2016-02-07 DIAGNOSIS — I340 Nonrheumatic mitral (valve) insufficiency: Secondary | ICD-10-CM | POA: Diagnosis not present

## 2016-02-07 DIAGNOSIS — I420 Dilated cardiomyopathy: Secondary | ICD-10-CM | POA: Diagnosis not present

## 2016-02-07 DIAGNOSIS — I5022 Chronic systolic (congestive) heart failure: Secondary | ICD-10-CM | POA: Diagnosis not present

## 2016-02-13 ENCOUNTER — Observation Stay (HOSPITAL_COMMUNITY)
Admission: AD | Admit: 2016-02-13 | Discharge: 2016-02-16 | Disposition: A | Discharge status: Home / Self Care | Payer: PPO | Source: Ambulatory Visit | Attending: Cardiovascular Disease | Admitting: Cardiovascular Disease

## 2016-02-13 ENCOUNTER — Encounter (HOSPITAL_COMMUNITY): Payer: Self-pay

## 2016-02-13 DIAGNOSIS — I130 Hypertensive heart and chronic kidney disease with heart failure and stage 1 through stage 4 chronic kidney disease, or unspecified chronic kidney disease: Secondary | ICD-10-CM | POA: Diagnosis not present

## 2016-02-13 DIAGNOSIS — E876 Hypokalemia: Secondary | ICD-10-CM | POA: Insufficient documentation

## 2016-02-13 DIAGNOSIS — Z955 Presence of coronary angioplasty implant and graft: Secondary | ICD-10-CM | POA: Insufficient documentation

## 2016-02-13 DIAGNOSIS — E119 Type 2 diabetes mellitus without complications: Secondary | ICD-10-CM | POA: Insufficient documentation

## 2016-02-13 DIAGNOSIS — I509 Heart failure, unspecified: Secondary | ICD-10-CM

## 2016-02-13 DIAGNOSIS — I420 Dilated cardiomyopathy: Secondary | ICD-10-CM | POA: Diagnosis not present

## 2016-02-13 DIAGNOSIS — E1122 Type 2 diabetes mellitus with diabetic chronic kidney disease: Secondary | ICD-10-CM | POA: Diagnosis not present

## 2016-02-13 DIAGNOSIS — N183 Chronic kidney disease, stage 3 (moderate): Secondary | ICD-10-CM | POA: Diagnosis not present

## 2016-02-13 DIAGNOSIS — I482 Chronic atrial fibrillation: Secondary | ICD-10-CM | POA: Insufficient documentation

## 2016-02-13 DIAGNOSIS — E785 Hyperlipidemia, unspecified: Secondary | ICD-10-CM | POA: Diagnosis not present

## 2016-02-13 DIAGNOSIS — Z86711 Personal history of pulmonary embolism: Secondary | ICD-10-CM | POA: Diagnosis not present

## 2016-02-13 DIAGNOSIS — E1165 Type 2 diabetes mellitus with hyperglycemia: Secondary | ICD-10-CM | POA: Diagnosis not present

## 2016-02-13 DIAGNOSIS — E871 Hypo-osmolality and hyponatremia: Secondary | ICD-10-CM | POA: Insufficient documentation

## 2016-02-13 DIAGNOSIS — I5023 Acute on chronic systolic (congestive) heart failure: Secondary | ICD-10-CM | POA: Diagnosis not present

## 2016-02-13 DIAGNOSIS — R188 Other ascites: Secondary | ICD-10-CM | POA: Insufficient documentation

## 2016-02-13 DIAGNOSIS — I2510 Atherosclerotic heart disease of native coronary artery without angina pectoris: Secondary | ICD-10-CM | POA: Diagnosis not present

## 2016-02-13 DIAGNOSIS — Z794 Long term (current) use of insulin: Secondary | ICD-10-CM | POA: Diagnosis not present

## 2016-02-13 DIAGNOSIS — E784 Other hyperlipidemia: Secondary | ICD-10-CM | POA: Diagnosis not present

## 2016-02-13 DIAGNOSIS — Z87891 Personal history of nicotine dependence: Secondary | ICD-10-CM | POA: Diagnosis not present

## 2016-02-13 DIAGNOSIS — E46 Unspecified protein-calorie malnutrition: Secondary | ICD-10-CM | POA: Diagnosis not present

## 2016-02-13 LAB — CBC WITH DIFFERENTIAL/PLATELET
Basophils Absolute: 0 10*3/uL (ref 0.0–0.1)
Basophils Relative: 1 %
Eosinophils Absolute: 0.2 10*3/uL (ref 0.0–0.7)
Eosinophils Relative: 3 %
HEMATOCRIT: 38.7 % — AB (ref 39.0–52.0)
HEMOGLOBIN: 12.7 g/dL — AB (ref 13.0–17.0)
LYMPHS ABS: 0.9 10*3/uL (ref 0.7–4.0)
LYMPHS PCT: 14 %
MCH: 29.3 pg (ref 26.0–34.0)
MCHC: 32.8 g/dL (ref 30.0–36.0)
MCV: 89.2 fL (ref 78.0–100.0)
MONOS PCT: 9 %
Monocytes Absolute: 0.6 10*3/uL (ref 0.1–1.0)
NEUTROS ABS: 4.5 10*3/uL (ref 1.7–7.7)
NEUTROS PCT: 73 %
PLATELETS: 110 10*3/uL — AB (ref 150–400)
RBC: 4.34 MIL/uL (ref 4.22–5.81)
RDW: 15.6 % — ABNORMAL HIGH (ref 11.5–15.5)
WBC: 6.1 10*3/uL (ref 4.0–10.5)

## 2016-02-13 MED ORDER — HEPARIN SODIUM (PORCINE) 5000 UNIT/ML IJ SOLN
5000.0000 [IU] | Freq: Three times a day (TID) | INTRAMUSCULAR | Status: DC
  Filled 2016-02-13 (×3): qty 1

## 2016-02-13 MED ORDER — METOLAZONE 2.5 MG PO TABS
2.5000 mg | ORAL_TABLET | ORAL | Status: DC
  Administered 2016-02-14 – 2016-02-16 (×2): 2.5 mg via ORAL
  Filled 2016-02-13 (×2): qty 1

## 2016-02-13 MED ORDER — FUROSEMIDE 10 MG/ML IJ SOLN
40.0000 mg | Freq: Two times a day (BID) | INTRAMUSCULAR | Status: DC
  Administered 2016-02-14 – 2016-02-15 (×5): 40 mg via INTRAVENOUS
  Filled 2016-02-13 (×5): qty 4

## 2016-02-13 MED ORDER — ACETAMINOPHEN 325 MG PO TABS: 650.0000 mg | ORAL_TABLET | ORAL | Status: DC | PRN

## 2016-02-13 MED ORDER — SPIRONOLACTONE 25 MG PO TABS
12.5000 mg | ORAL_TABLET | Freq: Every day | ORAL | Status: DC
  Administered 2016-02-14 – 2016-02-16 (×3): 12.5 mg via ORAL
  Filled 2016-02-13 (×3): qty 1

## 2016-02-13 MED ORDER — POTASSIUM CHLORIDE CRYS ER 10 MEQ PO TBCR
10.0000 meq | EXTENDED_RELEASE_TABLET | Freq: Two times a day (BID) | ORAL | Status: DC
  Administered 2016-02-14: 10 meq via ORAL
  Filled 2016-02-13: qty 1

## 2016-02-13 MED ORDER — ONDANSETRON HCL 4 MG/2ML IJ SOLN: 4.0000 mg | Freq: Four times a day (QID) | INTRAMUSCULAR | Status: DC | PRN

## 2016-02-13 MED ORDER — SODIUM CHLORIDE 0.9 % IV SOLN
250.0000 mL | INTRAVENOUS | Status: DC | PRN
  Administered 2016-02-14: 250 mL via INTRAVENOUS

## 2016-02-13 MED ORDER — SODIUM CHLORIDE 0.9% FLUSH
3.0000 mL | Freq: Two times a day (BID) | INTRAVENOUS | Status: DC
  Administered 2016-02-14 – 2016-02-15 (×4): 3 mL via INTRAVENOUS

## 2016-02-13 MED ORDER — INSULIN ASPART PROT & ASPART (70-30 MIX) 100 UNIT/ML ~~LOC~~ SUSP
20.0000 [IU] | Freq: Every day | SUBCUTANEOUS | Status: DC
  Administered 2016-02-14 – 2016-02-16 (×2): 20 [IU] via SUBCUTANEOUS
  Filled 2016-02-13 (×2): qty 10

## 2016-02-13 MED ORDER — SODIUM CHLORIDE 0.9% FLUSH: 3.0000 mL | INTRAVENOUS | Status: DC | PRN

## 2016-02-13 NOTE — H&P (Signed)
Referring Physician:  [name] is an 75 y.o. male.                       Chief Complaint: Shortness of breath with activity.  HPI: 75 year old male with known CAD, CABG, Type II DM and CHF has 5-6 pound weight gain with ascites, shortness of breath and leg edema. He has no fever or cough.   Past Medical History  Diagnosis Date  . Atrial fibrillation     ?GI bleed in past, plus pt tends to "self adjust/manage" his meds w/out regard for MD's guidance, so he has NOT been on anticoagulant for this.  . Coronary artery disease   . Hypertension   . Chronic systolic heart failure   . High cholesterol   . Pulmonary embolism 1980's or 1990's  . Pneumonia   . 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 Gout   . Chronic renal insufficiency, stage III (moderate)     stage II/III  . Heart murmur   . Chronic bronchitis     "used to get it often; haven't had it for awhile now" (04/12/2013)  . Type II diabetes mellitus   . Postpolio syndrome 1951    "affected the right side of my body; been weak on that side ever since" (04/12/2013)  . Severe mitral regurgitation 2013  . Severe tricuspid regurgitation 2013  . 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 Katy Fitch)  . Umbilical hernia [id number]/4095    Symptomatic--Dr. Georgette Dover to repair if cleared by cardiologist  . Subclinical hypothyroidism       Past Surgical History  Procedure Laterality Date  . Retinal laser procedure Bilateral     "more than once on the left" (04/12/2013)  . Cataract extraction w/ intraocular lens  implant, bilateral Bilateral 2005  . Appendectomy  ~ 1962  . Cardiac catheterization      "I've had a few since 1989" (04/12/2013)  . Cervical discectomy  1984    C6-7  . 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 Exploratory laparotomy  ~ 1962    "took out my appendix while there; bowel was inflammed" (04/12/2013)  . Hydrocele excision / repair      "couple  times" (04/12/2013)  . Transthoracic echocardiogram  2013;12/2014    LV dilation, EF 30-35%, diffuse hypokinesis, grade I diast dysfxn, severe mitral and tricuspid regurg, LAE and RAE--biventricular dysfunction moderate/severe  . Paracentesis  2013-2014    Multiple times, usually pulling off 6-7 liters of fluid  . Cardiac catheterization  12/2012    Severe native CAD, patent bypass grafts x 3, EF 25-30%--med mgmt  . Left and right heart catheterization with coronary/graft angiogram  01/13/2013    Procedure: LEFT AND RIGHT HEART CATHETERIZATION WITH Beatrix Fetters;  Surgeon: Birdie Riddle, MD;  Location: [facility];  Service: Cardiovascular;;    Family History  Problem Relation Age of Onset  . Cancer Sister   . Parkinson's disease Brother    Social History:  reports that he quit smoking about 42 years ago. His smoking use included Cigarettes. He has a 5 pack-year smoking history. He quit smokeless tobacco use about 46 years ago. His smokeless tobacco use included Chew. He reports that he does not drink alcohol or use illicit drugs.  Allergies:  Allergies  Allergen Reactions  . Adhesive [  Tape] Other (See Comments)    Pulls skin off. Please use "paper" tape.  . Sulfa Antibiotics Rash    Medications Prior to Admission  Medication Sig Dispense Refill  . Blood Glucose Monitoring Suppl (ONE TOUCH ULTRA SYSTEM KIT) W/DEVICE KIT 1 kit by Does not apply route once. Pt should check his glucose TID 1 each 0  . carvedilol (COREG) 3.125 MG tablet Take 1 tablet (3.125 mg total) by mouth 2 (two) times daily with a meal.    . furosemide (LASIX) 80 MG tablet Take 80 mg by mouth 3 (three) times daily.    Marland Kitchen glucose blood test strip Please check glucose BID, PRN or as directed. 100 each 3  . insulin NPH-regular Human (NOVOLIN 70/30) (70-30) 100 UNIT/ML injection 25 U SQ q BF and 15 U SQ q Supper 10 mL 11  . Insulin Syringe-Needle U-100 (RELION INSULIN SYR 0.3ML/31G) 31G X 5/16" 0.3 ML MISC Please  use BID along with insulin. 100 each 11  . metolazone (ZAROXOLYN) 2.5 MG tablet Take 1 tablet (2.5 mg total) by mouth every other day. 15 tablet 1  . miconazole (MICOTIN) 2 % powder Apply 1 application topically 3 (three) times daily as needed for itching (rash).     Glory Rosebush DELICA LANCETS 85U MISC Check blood sugar three times a day 100 each 11  . OVER THE COUNTER MEDICATION Take 2 capsules by mouth every morning. Ultra guard forte.  Multi vitamin in food form    . potassium chloride SA (K-DUR,KLOR-CON) 20 MEQ tablet Take 1-2 tablets (20-40 mEq total) by mouth 2 (two) times daily. 2 tabs in the morning and 1 tab in the evening    . spironolactone (ALDACTONE) 12.5 mg TABS Take 0.5 tablets (12.5 mg total) by mouth daily. (Patient taking differently: Take 12.5 mg by mouth daily. Pt has instructions to take the 25 mg if his BP >90/60.) 15 tablet 1    No results found for this or any previous visit (from the past 48 hour(s)). No results found.  Review Of Systems Constitutional: Positive for malaise/fatigue. Negative for fever, chills, weight loss and diaphoresis.  HENT: Negative for hearing loss, nosebleeds, sore throat and ear discharge.  Eyes: Negative for blurred vision, pain and discharge.  Respiratory: Positive for shortness of breath. Negative for cough and sputum production.  Cardiovascular: Positive for orthopnea. Negative for chest pain and leg swelling.  Gastrointestinal: Positive for abdominal pain. Negative for heartburn, nausea and constipation. + ascites.  Genitourinary: Negative for dysuria and urgency.  Musculoskeletal: Positive for joint pain. Negative for myalgias and back pain.  Skin: Negative for rash.  Neurological: Positive for weakness. Negative for dizziness, tremors, focal weakness, seizures and headaches.  Psychiatric/Behavioral: Positive for depression. Negative for suicidal ideas, memory loss and substance abuse. The patient is not nervous/anxious and does  not have insomnia  There were no vitals taken for this visit. Physical Exam: Constitutional: He appears averagely-developed and poorly nourished.  HENT: Head: Normocephalic and atraumatic. Mouth/Throat: No oropharyngeal exudate. Tongue-pink, wet and midline. Eyes: Conjunctivae are normal. Pupils are equal, round, and reactive to light. No scleral icterus.  Neck: Normal range of motion. JVD++ at 90 degree angle present. No tracheal deviation or thyromegaly present.  Cardiovascular: Normal rate and regular rhythm. III/VI systolic murmur heard.  Respiratory: He is in mild respiratory distress. He has few basal rales.  GI: Soft. He exhibits marked distension. There is no tenderness. There is no rebound.  Musculoskeletal: He exhibits 1 + edema of  lower leg and no tenderness.  Lymphadenopathy: He has no cervical adenopathy.  Neurological: He is alert and oriented to person, place, and time. No cranial nerve deficit. He moves all 4 extremities. Skin: Skin is warm and dry.  Psychiatric: He has a normal mood and affect.  Assessment/Plan Shortness of breath   Acute on chronic systolic left heart failure CAD CABG DM, II Dilated cardiomyopathy. Ascites  R/O MI Blood work, Home medications.  Birdie Riddle, MD  02/13/2016, 10:50 PM

## 2016-02-14 ENCOUNTER — Observation Stay (HOSPITAL_COMMUNITY): Payer: PPO

## 2016-02-14 DIAGNOSIS — E784 Other hyperlipidemia: Secondary | ICD-10-CM | POA: Diagnosis not present

## 2016-02-14 DIAGNOSIS — E1165 Type 2 diabetes mellitus with hyperglycemia: Secondary | ICD-10-CM | POA: Diagnosis not present

## 2016-02-14 DIAGNOSIS — I5023 Acute on chronic systolic (congestive) heart failure: Secondary | ICD-10-CM | POA: Diagnosis not present

## 2016-02-14 DIAGNOSIS — E46 Unspecified protein-calorie malnutrition: Secondary | ICD-10-CM | POA: Diagnosis not present

## 2016-02-14 DIAGNOSIS — I130 Hypertensive heart and chronic kidney disease with heart failure and stage 1 through stage 4 chronic kidney disease, or unspecified chronic kidney disease: Secondary | ICD-10-CM | POA: Diagnosis not present

## 2016-02-14 DIAGNOSIS — R0602 Shortness of breath: Secondary | ICD-10-CM | POA: Diagnosis not present

## 2016-02-14 DIAGNOSIS — R188 Other ascites: Secondary | ICD-10-CM | POA: Diagnosis not present

## 2016-02-14 LAB — COMPREHENSIVE METABOLIC PANEL
ALBUMIN: 3.4 g/dL — AB (ref 3.5–5.0)
ALT: 16 U/L — ABNORMAL LOW (ref 17–63)
ANION GAP: 12 (ref 5–15)
AST: 33 U/L (ref 15–41)
Alkaline Phosphatase: 103 U/L (ref 38–126)
BUN: 64 mg/dL — AB (ref 6–20)
CO2: 24 mmol/L (ref 22–32)
Calcium: 9.3 mg/dL (ref 8.9–10.3)
Chloride: 96 mmol/L — ABNORMAL LOW (ref 101–111)
Creatinine, Ser: 1.59 mg/dL — ABNORMAL HIGH (ref 0.61–1.24)
GFR calc Af Amer: 47 mL/min — ABNORMAL LOW (ref >60)
GFR calc non Af Amer: 41 mL/min — ABNORMAL LOW (ref >60)
GLUCOSE: 201 mg/dL — AB (ref 65–99)
POTASSIUM: 3.7 mmol/L (ref 3.5–5.1)
SODIUM: 132 mmol/L — AB (ref 135–145)
Total Bilirubin: 1.4 mg/dL — ABNORMAL HIGH (ref 0.3–1.2)
Total Protein: 7.7 g/dL (ref 6.5–8.1)

## 2016-02-14 LAB — TROPONIN I
TROPONIN I: 0.09 ng/mL — AB (ref <0.031)
TROPONIN I: 0.08 ng/mL — AB (ref <0.031)
Troponin I: 0.07 ng/mL — ABNORMAL HIGH (ref <0.031)

## 2016-02-14 LAB — BASIC METABOLIC PANEL
ANION GAP: 10 (ref 5–15)
BUN: 62 mg/dL — ABNORMAL HIGH (ref 6–20)
CALCIUM: 9.1 mg/dL (ref 8.9–10.3)
CO2: 25 mmol/L (ref 22–32)
Chloride: 98 mmol/L — ABNORMAL LOW (ref 101–111)
Creatinine, Ser: 1.44 mg/dL — ABNORMAL HIGH (ref 0.61–1.24)
GFR, EST AFRICAN AMERICAN: 53 mL/min — AB (ref >60)
GFR, EST NON AFRICAN AMERICAN: 46 mL/min — AB (ref >60)
GLUCOSE: 149 mg/dL — AB (ref 65–99)
POTASSIUM: 3.4 mmol/L — AB (ref 3.5–5.1)
SODIUM: 133 mmol/L — AB (ref 135–145)

## 2016-02-14 LAB — MAGNESIUM: MAGNESIUM: 2.3 mg/dL (ref 1.7–2.4)

## 2016-02-14 LAB — BRAIN NATRIURETIC PEPTIDE: B Natriuretic Peptide: 866.6 pg/mL — ABNORMAL HIGH (ref 0.0–100.0)

## 2016-02-14 MED ORDER — CARVEDILOL 3.125 MG PO TABS
3.1250 mg | ORAL_TABLET | Freq: Two times a day (BID) | ORAL | Status: DC
  Administered 2016-02-14 – 2016-02-16 (×4): 3.125 mg via ORAL
  Filled 2016-02-14 (×4): qty 1

## 2016-02-14 MED ORDER — POTASSIUM CHLORIDE CRYS ER 20 MEQ PO TBCR
20.0000 meq | EXTENDED_RELEASE_TABLET | Freq: Three times a day (TID) | ORAL | Status: DC
  Administered 2016-02-14 – 2016-02-16 (×7): 20 meq via ORAL
  Filled 2016-02-14: qty 2
  Filled 2016-02-14 (×7): qty 1

## 2016-02-14 NOTE — Progress Notes (Signed)
Ref: Jeoffrey MassedMCGOWEN,PHILIP H, MD   Subjective:  Feeling better. Afebrile. Low potassium with occasional frequent VPCs.  Objective:  Vital Signs in the last 24 hours: Temp:  [97.6 F (36.4 C)-97.9 F (36.6 C)] 97.7 F (36.5 C) (04/26 1320) Pulse Rate:  [32-75] 69 (04/26 1656) Cardiac Rhythm:  [-] Atrial fibrillation (04/26 0700) Resp:  [16-18] 18 (04/26 1320) BP: (113-120)/(57-70) 114/66 mmHg (04/26 1656) SpO2:  [95 %-99 %] 99 % (04/26 1320) Weight:  [65.318 kg (144 lb)-67.314 kg (148 lb 6.4 oz)] 65.318 kg (144 lb) (04/26 0500)  Physical Exam: BP Readings from Last 1 Encounters:  02/14/16 114/66    Wt Readings from Last 1 Encounters:  02/14/16 65.318 kg (144 lb)    Weight change:   HEENT: [place]/AT, Eyes-Blue, PERL, EOMI, Conjunctiva-Pink, Sclera-Non-icteric Neck: No JVD, No bruit, Trachea midline. Lungs:  Clear, Bilateral. Cardiac:  Regular rhythm, normal S1 and S2, no S3. III/VI systolic murmur. Abdomen:  Soft, non-tender. Extremities:  Trace edema present. No cyanosis. No clubbing. CNS: AxOx3, Cranial nerves grossly intact, moves all 4 extremities. Right handed. Skin: Warm and dry.   Intake/Output from previous day: 04/25 0701 - 04/26 0700 In: -  Out: 600 [Urine:600]    Lab Results: BMET    Component Value Date/Time   NA 133* 02/14/2016 0431   NA 132* 02/13/2016 2317   NA 132* 05/15/2015 1121   K 3.4* 02/14/2016 0431   K 3.7 02/13/2016 2317   K 3.9 05/15/2015 1121   CL 98* 02/14/2016 0431   CL 96* 02/13/2016 2317   CL 91* 05/15/2015 1121   CO2 25 02/14/2016 0431   CO2 24 02/13/2016 2317   CO2 29 05/15/2015 1121   GLUCOSE 149* 02/14/2016 0431   GLUCOSE 201* 02/13/2016 2317   GLUCOSE 98 05/15/2015 1121   BUN 62* 02/14/2016 0431   BUN 64* 02/13/2016 2317   BUN 68* 05/15/2015 1121   CREATININE 1.44* 02/14/2016 0431   CREATININE 1.59* 02/13/2016 2317   CREATININE 1.46 05/15/2015 1121   CALCIUM 9.1 02/14/2016 0431   CALCIUM 9.3 02/13/2016 2317   CALCIUM 9.9  05/15/2015 1121   GFRNONAA 46* 02/14/2016 0431   GFRNONAA 41* 02/13/2016 2317   GFRNONAA 52* 02/14/2015 0720   GFRAA 53* 02/14/2016 0431   GFRAA 47* 02/13/2016 2317   GFRAA 61* 02/14/2015 0720   CBC    Component Value Date/Time   WBC 6.1 02/13/2016 2317   WBC 10.1 02/21/2015 1501   RBC 4.34 02/13/2016 2317   RBC 5.05 02/21/2015 1501   HGB 12.7* 02/13/2016 2317   HGB 14.9 02/21/2015 1501   HCT 38.7* 02/13/2016 2317   HCT 45.8 02/21/2015 1501   PLT 110* 02/13/2016 2317   MCV 89.2 02/13/2016 2317   MCV 90.6 02/21/2015 1501   MCH 29.3 02/13/2016 2317   MCH 29.4 02/21/2015 1501   MCHC 32.8 02/13/2016 2317   MCHC 32.5 02/21/2015 1501   RDW 15.6* 02/13/2016 2317   LYMPHSABS 0.9 02/13/2016 2317   MONOABS 0.6 02/13/2016 2317   EOSABS 0.2 02/13/2016 2317   BASOSABS 0.0 02/13/2016 2317   HEPATIC Function Panel  Recent Labs  05/15/15 1121 02/13/16 2317  PROT 8.4* 7.7   HEMOGLOBIN A1C No components found for: HGA1C,  MPG CARDIAC ENZYMES Lab Results  Component Value Date   CKTOTAL 72 11/01/2011   CKMB 3.0 11/01/2011   TROPONINI 0.08* 02/14/2016   TROPONINI 0.09* 02/14/2016   TROPONINI 0.07* 02/13/2016   BNP No results for input(s): PROBNP in the last  8760 hours. TSH  Recent Labs  05/15/15 1121  TSH 5.79*   CHOLESTEROL No results for input(s): CHOL in the last 8760 hours.  Scheduled Meds: . carvedilol  3.125 mg Oral BID WC  . furosemide  40 mg Intravenous Q12H  . heparin  5,000 Units Subcutaneous Q8H  . insulin aspart protamine- aspart  20 Units Subcutaneous Q breakfast  . metolazone  2.5 mg Oral QODAY  . potassium chloride SA  20 mEq Oral TID  . sodium chloride flush  3 mL Intravenous Q12H  . spironolactone  12.5 mg Oral Daily   Continuous Infusions:  PRN Meds:.sodium chloride, acetaminophen, ondansetron (ZOFRAN) IV, sodium chloride flush  Assessment/Plan: Shortness of breath  Acute on chronic systolic left heart failure Abnormal Troponin-I, demand  ischemia more likely. CAD CABG DM, II Dilated cardiomyopathy. Ascites  Increase potassium supplement. Postpone paracentesis. Continue lasix.      Orpah Cobb  MD  02/14/2016, 7:37 PM

## 2016-02-14 NOTE — Progress Notes (Addendum)
Pt is havings some runs of PVCs, vital signs are good. Pt is nt in distress, and resting. [facility] Cardiologist on call notified. Dr. Algie CofferKadakia called back and gave a verbal order to check Mg lab levels. Charge RN aware. Will continue to monitor.  Pt refused his Heparin, Pt educated. MD sticky note left to inform the MD. Will continue to monitor.

## 2016-02-14 NOTE — Progress Notes (Signed)
RN called MD regarding Telemetry reading increase in PVCs and pt's concern that he is not getting his blood sugar checked. MD gave verbal order for carvedilol 3.125 BID with meals. NO further orders received at this time. Tobin Chadracy Taleeyah Bora 02/14/2016 2:06 PM

## 2016-02-14 NOTE — Progress Notes (Signed)
  Echocardiogram 2D Echocardiogram has been performed.  Cathie BeamsGREGORY, Tellis Spivak 02/14/2016, 3:17 PM

## 2016-02-14 NOTE — Care Management Obs Status (Signed)
MEDICARE OBSERVATION STATUS NOTIFICATION   Patient Details  Name: [name] MRN: [id number] Date of Birth: [birth year]   Medicare Observation Status Notification Given:  Yes    CrutchfieldDerrill Memo, Volanda Mangine M, RN 02/14/2016, 11:25 AM

## 2016-02-15 DIAGNOSIS — R188 Other ascites: Secondary | ICD-10-CM | POA: Diagnosis not present

## 2016-02-15 DIAGNOSIS — E46 Unspecified protein-calorie malnutrition: Secondary | ICD-10-CM | POA: Diagnosis not present

## 2016-02-15 DIAGNOSIS — E1165 Type 2 diabetes mellitus with hyperglycemia: Secondary | ICD-10-CM | POA: Diagnosis not present

## 2016-02-15 DIAGNOSIS — I130 Hypertensive heart and chronic kidney disease with heart failure and stage 1 through stage 4 chronic kidney disease, or unspecified chronic kidney disease: Secondary | ICD-10-CM | POA: Diagnosis not present

## 2016-02-15 DIAGNOSIS — E784 Other hyperlipidemia: Secondary | ICD-10-CM | POA: Diagnosis not present

## 2016-02-15 DIAGNOSIS — I5023 Acute on chronic systolic (congestive) heart failure: Secondary | ICD-10-CM | POA: Diagnosis not present

## 2016-02-15 LAB — BASIC METABOLIC PANEL
ANION GAP: 13 (ref 5–15)
BUN: 63 mg/dL — ABNORMAL HIGH (ref 6–20)
CHLORIDE: 96 mmol/L — AB (ref 101–111)
CO2: 27 mmol/L (ref 22–32)
Calcium: 9.6 mg/dL (ref 8.9–10.3)
Creatinine, Ser: 1.6 mg/dL — ABNORMAL HIGH (ref 0.61–1.24)
GFR calc Af Amer: 47 mL/min — ABNORMAL LOW (ref >60)
GFR, EST NON AFRICAN AMERICAN: 41 mL/min — AB (ref >60)
GLUCOSE: 97 mg/dL (ref 65–99)
POTASSIUM: 3.6 mmol/L (ref 3.5–5.1)
Sodium: 136 mmol/L (ref 135–145)

## 2016-02-15 LAB — GLUCOSE, CAPILLARY
GLUCOSE-CAPILLARY: 240 mg/dL — AB (ref 65–99)
Glucose-Capillary: 240 mg/dL — ABNORMAL HIGH (ref 65–99)
Glucose-Capillary: 333 mg/dL — ABNORMAL HIGH (ref 65–99)

## 2016-02-15 MED ORDER — INSULIN ASPART 100 UNIT/ML ~~LOC~~ SOLN: 3.0000 [IU] | Freq: Three times a day (TID) | SUBCUTANEOUS | Status: DC

## 2016-02-15 MED ORDER — INSULIN ASPART 100 UNIT/ML ~~LOC~~ SOLN
0.0000 [IU] | Freq: Three times a day (TID) | SUBCUTANEOUS | Status: DC
  Administered 2016-02-15: 3 [IU] via SUBCUTANEOUS
  Administered 2016-02-15: 7 [IU] via SUBCUTANEOUS
  Administered 2016-02-16: 2 [IU] via SUBCUTANEOUS

## 2016-02-15 NOTE — Progress Notes (Signed)
Ref: Jeoffrey MassedMCGOWEN,PHILIP H, MD   Subjective:  Feeling better. Leg edema resolved. Blood sugars are in 90-100 mg. range.   Objective:  Vital Signs in the last 24 hours: Temp:  [97.3 F (36.3 C)-97.7 F (36.5 C)] 97.7 F (36.5 C) (04/27 0528) Pulse Rate:  [32-75] 75 (04/27 0912) Cardiac Rhythm:  [-] Atrial fibrillation (04/27 0701) Resp:  [16-19] 16 (04/27 0528) BP: (107-136)/(56-76) 136/76 mmHg (04/27 0912) SpO2:  [95 %-99 %] 95 % (04/27 0528) Weight:  [65.182 kg (143 lb 11.2 oz)] 65.182 kg (143 lb 11.2 oz) (04/27 0500)  Physical Exam: BP Readings from Last 1 Encounters:  02/15/16 136/76    Wt Readings from Last 1 Encounters:  02/15/16 65.182 kg (143 lb 11.2 oz)    Weight change: -2.132 kg (-4 lb 11.2 oz)  HEENT: [place]/AT, Eyes-Blue, PERL, EOMI, Conjunctiva-Pink, Sclera-Non-icteric Neck: No JVD, No bruit, Trachea midline. Lungs:  Clear, Bilateral. Cardiac:  Regular rhythm, normal S1 and S2, no S3.  Abdomen:  Soft, non-tender. Extremities:  No edema present. No cyanosis. No clubbing. CNS: AxOx3, Cranial nerves grossly intact, moves all 4 extremities. Right handed. Skin: Warm and dry.   Intake/Output from previous day: 04/26 0701 - 04/27 0700 In: 480 [P.O.:480] Out: 2400 [Urine:2400]    Lab Results: BMET    Component Value Date/Time   NA 136 02/15/2016 0701   NA 133* 02/14/2016 0431   NA 132* 02/13/2016 2317   K 3.6 02/15/2016 0701   K 3.4* 02/14/2016 0431   K 3.7 02/13/2016 2317   CL 96* 02/15/2016 0701   CL 98* 02/14/2016 0431   CL 96* 02/13/2016 2317   CO2 27 02/15/2016 0701   CO2 25 02/14/2016 0431   CO2 24 02/13/2016 2317   GLUCOSE 97 02/15/2016 0701   GLUCOSE 149* 02/14/2016 0431   GLUCOSE 201* 02/13/2016 2317   BUN 63* 02/15/2016 0701   BUN 62* 02/14/2016 0431   BUN 64* 02/13/2016 2317   CREATININE 1.60* 02/15/2016 0701   CREATININE 1.44* 02/14/2016 0431   CREATININE 1.59* 02/13/2016 2317   CALCIUM 9.6 02/15/2016 0701   CALCIUM 9.1 02/14/2016 0431   CALCIUM 9.3 02/13/2016 2317   GFRNONAA 41* 02/15/2016 0701   GFRNONAA 46* 02/14/2016 0431   GFRNONAA 41* 02/13/2016 2317   GFRAA 47* 02/15/2016 0701   GFRAA 53* 02/14/2016 0431   GFRAA 47* 02/13/2016 2317   CBC    Component Value Date/Time   WBC 6.1 02/13/2016 2317   WBC 10.1 02/21/2015 1501   RBC 4.34 02/13/2016 2317   RBC 5.05 02/21/2015 1501   HGB 12.7* 02/13/2016 2317   HGB 14.9 02/21/2015 1501   HCT 38.7* 02/13/2016 2317   HCT 45.8 02/21/2015 1501   PLT 110* 02/13/2016 2317   MCV 89.2 02/13/2016 2317   MCV 90.6 02/21/2015 1501   MCH 29.3 02/13/2016 2317   MCH 29.4 02/21/2015 1501   MCHC 32.8 02/13/2016 2317   MCHC 32.5 02/21/2015 1501   RDW 15.6* 02/13/2016 2317   LYMPHSABS 0.9 02/13/2016 2317   MONOABS 0.6 02/13/2016 2317   EOSABS 0.2 02/13/2016 2317   BASOSABS 0.0 02/13/2016 2317   HEPATIC Function Panel  Recent Labs  05/15/15 1121 02/13/16 2317  PROT 8.4* 7.7   HEMOGLOBIN A1C No components found for: HGA1C,  MPG CARDIAC ENZYMES Lab Results  Component Value Date   CKTOTAL 72 11/01/2011   CKMB 3.0 11/01/2011   TROPONINI 0.08* 02/14/2016   TROPONINI 0.09* 02/14/2016   TROPONINI 0.07* 02/13/2016   BNP No  results for input(s): PROBNP in the last 8760 hours. TSH  Recent Labs  05/15/15 1121  TSH 5.79*   CHOLESTEROL No results for input(s): CHOL in the last 8760 hours.  Scheduled Meds: . carvedilol  3.125 mg Oral BID WC  . furosemide  40 mg Intravenous Q12H  . heparin  5,000 Units Subcutaneous Q8H  . insulin aspart  0-9 Units Subcutaneous TID WC  . insulin aspart protamine- aspart  20 Units Subcutaneous Q breakfast  . metolazone  2.5 mg Oral QODAY  . potassium chloride SA  20 mEq Oral TID  . sodium chloride flush  3 mL Intravenous Q12H  . spironolactone  12.5 mg Oral Daily   Continuous Infusions:  PRN Meds:.sodium chloride, acetaminophen, ondansetron (ZOFRAN) IV, sodium chloride flush  Assessment/Plan: Shortness of breath  Acute on  chronic systolic left heart failure Abnormal Troponin-I, demand ischemia more likely. CAD CABG DM, II Dilated cardiomyopathy. Ascites   Continue diuresis/Change to PO lasix from tomorrow. Ambulate as tolerated. Sliding scale glycemic control.      Orpah Cobb  MD  02/15/2016, 9:46 AM

## 2016-02-16 DIAGNOSIS — E784 Other hyperlipidemia: Secondary | ICD-10-CM | POA: Diagnosis not present

## 2016-02-16 DIAGNOSIS — R188 Other ascites: Secondary | ICD-10-CM | POA: Diagnosis not present

## 2016-02-16 DIAGNOSIS — I5023 Acute on chronic systolic (congestive) heart failure: Secondary | ICD-10-CM | POA: Diagnosis not present

## 2016-02-16 DIAGNOSIS — I130 Hypertensive heart and chronic kidney disease with heart failure and stage 1 through stage 4 chronic kidney disease, or unspecified chronic kidney disease: Secondary | ICD-10-CM | POA: Diagnosis not present

## 2016-02-16 DIAGNOSIS — E1165 Type 2 diabetes mellitus with hyperglycemia: Secondary | ICD-10-CM | POA: Diagnosis not present

## 2016-02-16 DIAGNOSIS — E46 Unspecified protein-calorie malnutrition: Secondary | ICD-10-CM | POA: Diagnosis not present

## 2016-02-16 LAB — BASIC METABOLIC PANEL
ANION GAP: 13 (ref 5–15)
BUN: 72 mg/dL — ABNORMAL HIGH (ref 6–20)
CALCIUM: 9.2 mg/dL (ref 8.9–10.3)
CO2: 24 mmol/L (ref 22–32)
Chloride: 96 mmol/L — ABNORMAL LOW (ref 101–111)
Creatinine, Ser: 1.69 mg/dL — ABNORMAL HIGH (ref 0.61–1.24)
GFR, EST AFRICAN AMERICAN: 44 mL/min — AB (ref >60)
GFR, EST NON AFRICAN AMERICAN: 38 mL/min — AB (ref >60)
Glucose, Bld: 175 mg/dL — ABNORMAL HIGH (ref 65–99)
Potassium: 3.9 mmol/L (ref 3.5–5.1)
Sodium: 133 mmol/L — ABNORMAL LOW (ref 135–145)

## 2016-02-16 LAB — ECHOCARDIOGRAM COMPLETE
HEIGHTINCHES: 65 in
WEIGHTICAEL: 2304 [oz_av]

## 2016-02-16 LAB — GLUCOSE, CAPILLARY: GLUCOSE-CAPILLARY: 165 mg/dL — AB (ref 65–99)

## 2016-02-16 MED ORDER — FUROSEMIDE 80 MG PO TABS
80.0000 mg | ORAL_TABLET | Freq: Two times a day (BID) | ORAL | Status: DC
  Administered 2016-02-16: 80 mg via ORAL
  Filled 2016-02-16: qty 1

## 2016-02-16 NOTE — Progress Notes (Signed)
Nsg Discharge Note  Admit Date:  02/13/2016 Discharge date: 02/16/2016   Antonis Lor to be D/C'd Home per MD order.  AVS completed.  Copy for chart, and copy for patient signed, and dated. Patient/caregiver able to verbalize understanding.  Discharge Medication:   Medication List    TAKE these medications        camphor-menthol lotion  Commonly known as:  SARNA  Apply 1 application topically as needed (pain).     carvedilol 3.125 MG tablet  Commonly known as:  COREG  Take 1 tablet (3.125 mg total) by mouth 2 (two) times daily with a meal.     furosemide 80 MG tablet  Commonly known as:  LASIX  Take 80 mg by mouth 3 (three) times daily.     glucose blood test strip  Please check glucose BID, PRN or as directed.     insulin NPH-regular Human (70-30) 100 UNIT/ML injection  Commonly known as:  NOVOLIN 70/30  25 U SQ q BF and 15 U SQ q Supper     Insulin Syringe-Needle U-100 31G X 5/16" 0.3 ML Misc  Commonly known as:  RELION INSULIN SYR 0.3ML/31G  Please use BID along with insulin.     metolazone 5 MG tablet  Commonly known as:  ZAROXOLYN  Take 5 mg by mouth daily.     ONE TOUCH ULTRA SYSTEM KIT w/Device Kit  1 kit by Does not apply route once. Pt should check his glucose TID     ONETOUCH DELICA LANCETS 29V Misc  Check blood sugar three times a day     OVER THE COUNTER MEDICATION  Take 1 tablet by mouth daily. Whole Food "food form"     potassium chloride SA 20 MEQ tablet  Commonly known as:  K-DUR,KLOR-CON  Take 1-2 tablets (20-40 mEq total) by mouth 2 (two) times daily. 2 tabs in the morning and 1 tab in the evening     spironolactone 12.5 mg Tabs tablet  Commonly known as:  ALDACTONE  Take 0.5 tablets (12.5 mg total) by mouth daily.        Discharge Assessment: Filed Vitals:   02/15/16 2201 02/16/16 0425  BP: 116/63 102/49  Pulse: 70 74  Temp: 97.8 F (36.6 C) 98.2 F (36.8 C)  Resp: 18    Skin clean, dry and intact without evidence of skin break  down, no evidence of skin tears noted. IV catheter discontinued intact. Site without signs and symptoms of complications - no redness or edema noted at insertion site, patient denies c/o pain - only slight tenderness at site.  Dressing with slight pressure applied.  D/c Instructions-Education: Discharge instructions given to patient/family with verbalized understanding. D/c education completed with patient/family including follow up instructions, medication list, d/c activities limitations if indicated, with other d/c instructions as indicated by MD - patient able to verbalize understanding, all questions fully answered. Patient instructed to return to ED, call 911, or call MD for any changes in condition.  Patient escorted via [place], and D/C home via private auto.  Dayle Points, RN 02/16/2016 11:05 AM

## 2016-02-16 NOTE — Care Management Note (Signed)
Case Management Note  Patient Details  Name: [name] MRN: [id number] Date of Birth: Dec 14, 1940  Subjective/Objective:                 Patient admitted from home with wife. Independent PTA. No needs identified by CM. Dyspnea improved with diuresis, did not require para.    Action/Plan:  Will DC to home, self care, today. Expected Discharge Date:                  Expected Discharge Plan:  Home/Self Care  In-House Referral:     Discharge planning Services  CM Consult  Post Acute Care Choice:  NA Choice offered to:     DME Arranged:    DME Agency:     HH Arranged:    HH Agency:     Status of Service:  Completed, signed off  Medicare Important Message Given:    Date Medicare IM Given:    Medicare IM give by:    Date Additional Medicare IM Given:    Additional Medicare Important Message give by:     If discussed at Long Length of Stay Meetings, dates discussed:    Additional Comments:  Lawerance SabalDebbie Kenedie Dirocco, RN 02/16/2016, 10:25 AM

## 2016-02-16 NOTE — Discharge Summary (Signed)
Physician Discharge Summary  Patient ID: [name] MRN: [id number] DOB/AGE: [birth year] 76 y.o.  Admit date: 02/13/2016 Discharge date: 02/16/2016  Admission Diagnoses: Shortness of breath  Acute on chronic systolic left heart failure Abnormal Troponin-I, demand ischemia more likely. CAD CABG DM, II Dilated cardiomyopathy. Ascites  Discharge Diagnoses:  Principal Problem:   Acute on chronic left systolic heart failure (HCC) Active Problems:   DM II (diabetes mellitus, type II), controlled (HCC)   Nonischemic dilated cardiomyopathy (HCC)   Abnormal Troponin-I, demand ischemia more likely.   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 He has no fever or cough. His BNP was 866.6. He had steady diuresis with IV lasix. His ascites was mild post diuresis hence paracentesis was not done.  His chronic atrial fibrillation is managed with rate control as patient is against use of blood thinners on daily basis. He was discharged home in stable condition with follow up by me and primary care in 1 month.   Consults: cardiology  Significant Diagnostic Studies: labs: Mild hypokalemia and hyponatremia with BUN 64 and Creatinine of 1.59. Minimally elevated Troponin-I. Normal blood sugars.   EKG-Atrial fibrillation, LVH, lateral ischemia and possible inferior infarct.  Treatments: cardiac meds: carvedilol, spironolactone and furosemide.  Discharge Exam: Blood pressure 102/49, pulse 74, temperature 98.2 F (36.8 C), temperature source Oral, resp. rate 18, height 5' 5"  (1.651 m), weight 65.2 kg (143 lb 11.8 oz), SpO2 96 %. HEENT: [place]/AT, Eyes-Blue, PERL, EOMI, Conjunctiva-Pink, Sclera-Non-icteric Neck: No JVD, No bruit, Trachea midline. Lungs: Clear,  Bilateral. Cardiac: Irregular rhythm, normal S1 and S2, no S3. III/VI systolic murmur. Abdomen: Soft, non-tender. Extremities: No edema present. No cyanosis. No clubbing. CNS: AxOx3, Cranial nerves grossly intact, moves all 4 extremities. Right handed. Skin: Warm and dry.  Disposition: 01-Home or Self Care     Medication List    TAKE these medications        camphor-menthol lotion  Commonly known as:  SARNA  Apply 1 application topically as needed (pain).     carvedilol 3.125 MG tablet  Commonly known as:  COREG  Take 1 tablet (3.125 mg total) by mouth 2 (two) times daily with a meal.     furosemide 80 MG tablet  Commonly known as:  LASIX  Take 80 mg by mouth 3 (three) times daily.     glucose blood test strip  Please check glucose BID, PRN or as directed.     insulin NPH-regular Human (70-30) 100 UNIT/ML injection  Commonly known as:  NOVOLIN 70/30  25 U SQ q BF and 15 U SQ q Supper     Insulin Syringe-Needle U-100 31G X 5/16" 0.3 ML Misc  Commonly known as:  RELION INSULIN SYR 0.3ML/31G  Please use BID along with insulin.     metolazone 5 MG tablet  Commonly known as:  ZAROXOLYN  Take 5 mg by mouth daily.     ONE TOUCH ULTRA SYSTEM KIT w/Device Kit  1 kit by Does not apply route once. Pt should check his glucose TID     ONETOUCH DELICA LANCETS 03E Misc  Check blood sugar three times a day     OVER THE COUNTER MEDICATION  Take 1 tablet by mouth daily.  Whole Food "food form"     potassium chloride SA 20 MEQ tablet  Commonly known as:  K-DUR,KLOR-CON  Take 1-2 tablets (20-40 mEq total) by mouth 2 (two) times daily. 2 tabs in the morning and 1 tab in the evening     spironolactone 12.5 mg Tabs tablet  Commonly known as:  ALDACTONE  Take 0.5 tablets (12.5 mg total) by mouth daily.           Follow-up Information    Follow up with MCGOWEN,PHILIP H, MD. Schedule an appointment as soon as possible for a visit in 1 month.   Specialty:  Family Medicine    Contact information:   [address] [place] [address] [phone]       Follow up with [facility] S, MD. Schedule an appointment as soon as possible for a visit in 1 month.   Specialty:  Cardiology   Contact information:   [place] [address] [phone]       Signed: Birdie Riddle 02/16/2016, 8:50 AM

## 2016-03-27 ENCOUNTER — Observation Stay (HOSPITAL_COMMUNITY)
Admission: AD | Admit: 2016-03-27 | Discharge: 2016-03-29 | Disposition: A | Discharge status: Home / Self Care | Payer: PPO | Source: Ambulatory Visit | Attending: Cardiovascular Disease | Admitting: Cardiovascular Disease

## 2016-03-27 ENCOUNTER — Observation Stay (HOSPITAL_COMMUNITY): Payer: PPO

## 2016-03-27 DIAGNOSIS — E871 Hypo-osmolality and hyponatremia: Secondary | ICD-10-CM | POA: Diagnosis not present

## 2016-03-27 DIAGNOSIS — I509 Heart failure, unspecified: Secondary | ICD-10-CM

## 2016-03-27 DIAGNOSIS — Z794 Long term (current) use of insulin: Secondary | ICD-10-CM | POA: Insufficient documentation

## 2016-03-27 DIAGNOSIS — D696 Thrombocytopenia, unspecified: Secondary | ICD-10-CM | POA: Diagnosis not present

## 2016-03-27 DIAGNOSIS — I420 Dilated cardiomyopathy: Secondary | ICD-10-CM | POA: Insufficient documentation

## 2016-03-27 DIAGNOSIS — Z87891 Personal history of nicotine dependence: Secondary | ICD-10-CM | POA: Diagnosis not present

## 2016-03-27 DIAGNOSIS — Z86711 Personal history of pulmonary embolism: Secondary | ICD-10-CM | POA: Insufficient documentation

## 2016-03-27 DIAGNOSIS — N183 Chronic kidney disease, stage 3 (moderate): Secondary | ICD-10-CM | POA: Diagnosis not present

## 2016-03-27 DIAGNOSIS — I2510 Atherosclerotic heart disease of native coronary artery without angina pectoris: Secondary | ICD-10-CM | POA: Diagnosis not present

## 2016-03-27 DIAGNOSIS — E43 Unspecified severe protein-calorie malnutrition: Secondary | ICD-10-CM | POA: Insufficient documentation

## 2016-03-27 DIAGNOSIS — E02 Subclinical iodine-deficiency hypothyroidism: Secondary | ICD-10-CM | POA: Insufficient documentation

## 2016-03-27 DIAGNOSIS — I5023 Acute on chronic systolic (congestive) heart failure: Secondary | ICD-10-CM | POA: Diagnosis present

## 2016-03-27 DIAGNOSIS — I130 Hypertensive heart and chronic kidney disease with heart failure and stage 1 through stage 4 chronic kidney disease, or unspecified chronic kidney disease: Principal | ICD-10-CM | POA: Insufficient documentation

## 2016-03-27 DIAGNOSIS — Z951 Presence of aortocoronary bypass graft: Secondary | ICD-10-CM | POA: Diagnosis not present

## 2016-03-27 DIAGNOSIS — E1165 Type 2 diabetes mellitus with hyperglycemia: Secondary | ICD-10-CM | POA: Diagnosis not present

## 2016-03-27 DIAGNOSIS — E119 Type 2 diabetes mellitus without complications: Secondary | ICD-10-CM

## 2016-03-27 DIAGNOSIS — E784 Other hyperlipidemia: Secondary | ICD-10-CM | POA: Diagnosis not present

## 2016-03-27 DIAGNOSIS — E1122 Type 2 diabetes mellitus with diabetic chronic kidney disease: Secondary | ICD-10-CM | POA: Insufficient documentation

## 2016-03-27 DIAGNOSIS — I517 Cardiomegaly: Secondary | ICD-10-CM | POA: Diagnosis not present

## 2016-03-27 DIAGNOSIS — E7800 Pure hypercholesterolemia, unspecified: Secondary | ICD-10-CM | POA: Insufficient documentation

## 2016-03-27 DIAGNOSIS — R188 Other ascites: Secondary | ICD-10-CM | POA: Diagnosis present

## 2016-03-27 DIAGNOSIS — E46 Unspecified protein-calorie malnutrition: Secondary | ICD-10-CM | POA: Diagnosis not present

## 2016-03-27 LAB — COMPREHENSIVE METABOLIC PANEL
ALBUMIN: 3.5 g/dL (ref 3.5–5.0)
ALT: 15 U/L — ABNORMAL LOW (ref 17–63)
AST: 33 U/L (ref 15–41)
Alkaline Phosphatase: 98 U/L (ref 38–126)
Anion gap: 10 (ref 5–15)
BUN: 61 mg/dL — ABNORMAL HIGH (ref 6–20)
CHLORIDE: 96 mmol/L — AB (ref 101–111)
CO2: 26 mmol/L (ref 22–32)
CREATININE: 1.47 mg/dL — AB (ref 0.61–1.24)
Calcium: 9.4 mg/dL (ref 8.9–10.3)
GFR calc non Af Amer: 45 mL/min — ABNORMAL LOW (ref >60)
GFR, EST AFRICAN AMERICAN: 52 mL/min — AB (ref >60)
GLUCOSE: 116 mg/dL — AB (ref 65–99)
Potassium: 3.8 mmol/L (ref 3.5–5.1)
SODIUM: 132 mmol/L — AB (ref 135–145)
Total Bilirubin: 1.4 mg/dL — ABNORMAL HIGH (ref 0.3–1.2)
Total Protein: 7.9 g/dL (ref 6.5–8.1)

## 2016-03-27 LAB — CBC WITH DIFFERENTIAL/PLATELET
Basophils Absolute: 0 10*3/uL (ref 0.0–0.1)
Basophils Relative: 0 %
EOS ABS: 0.2 10*3/uL (ref 0.0–0.7)
Eosinophils Relative: 4 %
HEMATOCRIT: 38.6 % — AB (ref 39.0–52.0)
HEMOGLOBIN: 12.8 g/dL — AB (ref 13.0–17.0)
LYMPHS ABS: 0.9 10*3/uL (ref 0.7–4.0)
Lymphocytes Relative: 16 %
MCH: 29.1 pg (ref 26.0–34.0)
MCHC: 33.2 g/dL (ref 30.0–36.0)
MCV: 87.7 fL (ref 78.0–100.0)
MONO ABS: 0.6 10*3/uL (ref 0.1–1.0)
MONOS PCT: 10 %
NEUTROS ABS: 4.2 10*3/uL (ref 1.7–7.7)
NEUTROS PCT: 70 %
Platelets: 134 10*3/uL — ABNORMAL LOW (ref 150–400)
RBC: 4.4 MIL/uL (ref 4.22–5.81)
RDW: 16.2 % — ABNORMAL HIGH (ref 11.5–15.5)
WBC: 6 10*3/uL (ref 4.0–10.5)

## 2016-03-27 LAB — GLUCOSE, CAPILLARY: GLUCOSE-CAPILLARY: 143 mg/dL — AB (ref 65–99)

## 2016-03-27 LAB — BRAIN NATRIURETIC PEPTIDE: B Natriuretic Peptide: 921 pg/mL — ABNORMAL HIGH (ref 0.0–100.0)

## 2016-03-27 MED ORDER — SODIUM CHLORIDE 0.9% FLUSH
3.0000 mL | Freq: Two times a day (BID) | INTRAVENOUS | Status: DC
  Administered 2016-03-28 (×2): 3 mL via INTRAVENOUS

## 2016-03-27 MED ORDER — HEPARIN SODIUM (PORCINE) 5000 UNIT/ML IJ SOLN: 5000.0000 [IU] | Freq: Three times a day (TID) | INTRAMUSCULAR | Status: DC

## 2016-03-27 MED ORDER — SPIRONOLACTONE 25 MG PO TABS
12.5000 mg | ORAL_TABLET | Freq: Every day | ORAL | Status: DC
  Administered 2016-03-29: 12.5 mg via ORAL
  Filled 2016-03-27 (×2): qty 1

## 2016-03-27 MED ORDER — CARVEDILOL 3.125 MG PO TABS
3.1250 mg | ORAL_TABLET | Freq: Two times a day (BID) | ORAL | Status: DC
  Administered 2016-03-28 – 2016-03-29 (×3): 3.125 mg via ORAL
  Filled 2016-03-27 (×3): qty 1

## 2016-03-27 MED ORDER — SODIUM CHLORIDE 0.9% FLUSH
3.0000 mL | Freq: Two times a day (BID) | INTRAVENOUS | Status: DC
  Administered 2016-03-27 – 2016-03-29 (×2): 3 mL via INTRAVENOUS

## 2016-03-27 MED ORDER — INSULIN ASPART 100 UNIT/ML ~~LOC~~ SOLN
0.0000 [IU] | Freq: Three times a day (TID) | SUBCUTANEOUS | Status: DC
  Administered 2016-03-28: 3 [IU] via SUBCUTANEOUS
  Administered 2016-03-29 (×2): 2 [IU] via SUBCUTANEOUS

## 2016-03-27 MED ORDER — INSULIN ASPART 100 UNIT/ML ~~LOC~~ SOLN
3.0000 [IU] | Freq: Three times a day (TID) | SUBCUTANEOUS | Status: DC
  Administered 2016-03-28 – 2016-03-29 (×3): 3 [IU] via SUBCUTANEOUS

## 2016-03-27 MED ORDER — METOLAZONE 5 MG PO TABS
5.0000 mg | ORAL_TABLET | Freq: Every day | ORAL | Status: DC
  Administered 2016-03-29: 5 mg via ORAL
  Filled 2016-03-27 (×2): qty 1

## 2016-03-27 MED ORDER — SODIUM CHLORIDE 0.9 % IV SOLN: 250.0000 mL | INTRAVENOUS | Status: DC | PRN

## 2016-03-27 MED ORDER — POTASSIUM CHLORIDE CRYS ER 20 MEQ PO TBCR
20.0000 meq | EXTENDED_RELEASE_TABLET | Freq: Three times a day (TID) | ORAL | Status: DC
  Administered 2016-03-27 – 2016-03-29 (×4): 20 meq via ORAL
  Filled 2016-03-27 (×5): qty 1

## 2016-03-27 MED ORDER — INSULIN ASPART PROT & ASPART (70-30 MIX) 100 UNIT/ML ~~LOC~~ SUSP: 35.0000 [IU] | Freq: Every day | SUBCUTANEOUS | Status: DC

## 2016-03-27 MED ORDER — FUROSEMIDE 80 MG PO TABS
80.0000 mg | ORAL_TABLET | Freq: Three times a day (TID) | ORAL | Status: DC
  Administered 2016-03-27 – 2016-03-29 (×4): 80 mg via ORAL
  Filled 2016-03-27 (×5): qty 1

## 2016-03-27 MED ORDER — FUROSEMIDE 80 MG PO TABS: 80.0000 mg | ORAL_TABLET | Freq: Three times a day (TID) | ORAL | Status: DC

## 2016-03-27 MED ORDER — SODIUM CHLORIDE 0.9% FLUSH: 3.0000 mL | INTRAVENOUS | Status: DC | PRN

## 2016-03-27 NOTE — Progress Notes (Signed)
Pt arrived to floor as direct admit.  Telemetry placed and CCMD notified x 2.  Call placed to attending to alert of arrival.  Awaiting admission orders.  Pt denies chest pain and sob on arrival.  Pt stable.  Will cont to monitor.

## 2016-03-27 NOTE — H&P (Signed)
Referring Physician:  Khaliq Turay is an 75 y.o. male.                       Chief Complaint: Shortness of breath and abdominal swelling.   HPI: 75 year old male with known CAD, CABG, Type II DM and CHF has 10 pound weight gain with ascites, shortness of breath without much leg edema. He has no fever or cough.   Past Medical History  Diagnosis Date  . Atrial fibrillation ([place])     ?GI bleed in past, plus pt tends to "self adjust/manage" his meds w/out regard for MD's guidance, so he has NOT been on anticoagulant for this.  . Coronary artery disease   . Hypertension   . Chronic systolic heart failure ([place])   . High cholesterol   . Pulmonary embolism ([place]) 1980's or 1990's  . Pneumonia   . 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 Gout   . Chronic renal insufficiency, stage III (moderate)     stage II/III  . Heart murmur   . Chronic bronchitis ([place])     "used to get it often; haven't had it for awhile now" (04/12/2013)  . Type II diabetes mellitus ([place])   . Postpolio syndrome 1951    "affected the right side of my body; been weak on that side ever since" (04/12/2013)  . Severe mitral regurgitation 2013  . Severe tricuspid regurgitation 2013  . 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 Katy Fitch)  . Umbilical hernia 12/9028    Symptomatic--Dr. Georgette Dover to repair if cleared by cardiologist  . Subclinical hypothyroidism       Past Surgical History  Procedure Laterality Date  . Retinal laser procedure Bilateral     "more than once on the left" (04/12/2013)  . Cataract extraction w/ intraocular lens  implant, bilateral Bilateral 2005  . Appendectomy  ~ 1962  . Cardiac catheterization      "I've had a few since 1989" (04/12/2013)  . Cervical discectomy  1984    C6-7  . 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 Exploratory laparotomy  ~ 1962    "took out my appendix while there; bowel was inflammed"  (04/12/2013)  . Hydrocele excision / repair      "couple times" (04/12/2013)  . Transthoracic echocardiogram  2013;12/2014    LV dilation, EF 30-35%, diffuse hypokinesis, grade I diast dysfxn, severe mitral and tricuspid regurg, LAE and RAE--biventricular dysfunction moderate/severe  . Paracentesis  2013-2014    Multiple times, usually pulling off 6-7 liters of fluid  . Cardiac catheterization  12/2012    Severe native CAD, patent bypass grafts x 3, EF 25-30%--med mgmt  . Left and right heart catheterization with coronary/graft angiogram  01/13/2013    Procedure: LEFT AND RIGHT HEART CATHETERIZATION WITH Beatrix Fetters;  Surgeon: Birdie Riddle, MD;  Location: [facility];  Service: Cardiovascular;;    Family History  Problem Relation Age of Onset  . Cancer Sister   . Parkinson's disease Brother    Social History:  reports that he quit smoking about 42 years ago. His smoking use included Cigarettes. He has a 5 pack-year smoking history. He quit smokeless tobacco use about 46 years ago. His smokeless tobacco use included Chew. He reports that he does not drink alcohol or use illicit drugs.  Allergies:  Allergies  Allergen Reactions  . Adhesive [Tape] Other (See Comments)    Pulls skin off. Please use "paper" tape.  . Sulfa Antibiotics Rash    Medications Prior to Admission  Medication Sig Dispense Refill  . Blood Glucose Monitoring Suppl (ONE TOUCH ULTRA SYSTEM KIT) W/DEVICE KIT 1 kit by Does not apply route once. Pt should check his glucose TID 1 each 0  . camphor-menthol (SARNA) lotion Apply 1 application topically as needed (pain).    . carvedilol (COREG) 3.125 MG tablet Take 1 tablet (3.125 mg total) by mouth 2 (two) times daily with a meal.    . furosemide (LASIX) 80 MG tablet Take 80 mg by mouth 3 (three) times daily.    Marland Kitchen glucose blood test strip Please check glucose BID, PRN or as directed. 100 each 3  . insulin NPH-regular Human (NOVOLIN 70/30) (70-30) 100 UNIT/ML  injection 25 U SQ q BF and 15 U SQ q Supper (Patient taking differently: Inject 35 Units into the skin daily with breakfast. ) 10 mL 11  . Insulin Syringe-Needle U-100 (RELION INSULIN SYR 0.3ML/31G) 31G X 5/16" 0.3 ML MISC Please use BID along with insulin. 100 each 11  . metolazone (ZAROXOLYN) 5 MG tablet Take 5 mg by mouth daily.    Glory Rosebush DELICA LANCETS 24Q MISC Check blood sugar three times a day 100 each 11  . OVER THE COUNTER MEDICATION Take 1 tablet by mouth daily. Whole Food "food form"    . potassium chloride SA (K-DUR,KLOR-CON) 20 MEQ tablet Take 1-2 tablets (20-40 mEq total) by mouth 2 (two) times daily. 2 tabs in the morning and 1 tab in the evening (Patient taking differently: Take 20 mEq by mouth 3 (three) times daily. 2 tabs in the morning and 1 tab in the evening)    . spironolactone (ALDACTONE) 12.5 mg TABS Take 0.5 tablets (12.5 mg total) by mouth daily. 15 tablet 1    No results found for this or any previous visit (from the past 48 hour(s)). No results found.  Review Of Systems Constitutional: Positive for malaise/fatigue. Negative for fever, chills, weight loss and diaphoresis.  HENT: Negative for hearing loss, nosebleeds, sore throat and ear discharge.  Eyes: Negative for blurred vision, pain and discharge.  Respiratory: Positive for shortness of breath. Negative for cough and sputum production.  Cardiovascular: Positive for orthopnea. Negative for chest pain and leg swelling.  Gastrointestinal: Positive for abdominal pain. Negative for heartburn, nausea and constipation. + ascites.  Genitourinary: Negative for dysuria and urgency.  Musculoskeletal: Positive for joint pain. Negative for myalgias and back pain.  Skin: Negative for rash.  Neurological: Positive for weakness. Negative for dizziness, tremors, focal weakness, seizures and headaches.  Psychiatric/Behavioral: Positive for depression. Negative for suicidal ideas, memory loss and substance abuse. The  patient is not nervous/anxious and does not have insomnia  Blood pressure 112/90, pulse 76, temperature 97.6 F (36.4 C), temperature source Oral, resp. rate 17, height 5' 5"  (1.651 m), SpO2 99 %. Physical Exam: Constitutional: He appears averagely-developed and poorly nourished.  HENT: Head: Normocephalic and atraumatic. Mouth/Throat: No oropharyngeal exudate. Tongue-pink, wet and midline. Eyes: Conjunctivae are normal. Pupils are equal, round, and reactive to light. No scleral icterus.  Neck: Normal range of motion. JVD++ at 90 degree angle present. No tracheal deviation or thyromegaly present.  Cardiovascular: Normal rate and regular rhythm. III/VI systolic murmur heard.  Respiratory: He is in mild respiratory distress. He has few basal rales.  GI: Soft. He  exhibits marked distension. There is no tenderness. There is no rebound.  Musculoskeletal: He exhibits trace edema of left lower leg and no tenderness.  Lymphadenopathy: He has no cervical adenopathy.  Neurological: He is alert and oriented to person, place, and time. No cranial nerve deficit. He moves all 4 extremities. Skin: Skin is warm and dry.  Psychiatric: He has a normal mood and affect.  Assessment/Plan Shortness of breath  Acute on chronic systolic left heart failure Ascites CAD CABG DM, II Dilated cardiomyopathy.  Place in observation. R/O CHF. Paracentesis in AM.   Birdie Riddle, MD  03/27/2016, 6:03 PM

## 2016-03-28 ENCOUNTER — Encounter (HOSPITAL_COMMUNITY): Payer: Self-pay

## 2016-03-28 DIAGNOSIS — I130 Hypertensive heart and chronic kidney disease with heart failure and stage 1 through stage 4 chronic kidney disease, or unspecified chronic kidney disease: Secondary | ICD-10-CM | POA: Diagnosis not present

## 2016-03-28 DIAGNOSIS — E46 Unspecified protein-calorie malnutrition: Secondary | ICD-10-CM | POA: Diagnosis not present

## 2016-03-28 DIAGNOSIS — I5023 Acute on chronic systolic (congestive) heart failure: Secondary | ICD-10-CM | POA: Diagnosis not present

## 2016-03-28 DIAGNOSIS — E1165 Type 2 diabetes mellitus with hyperglycemia: Secondary | ICD-10-CM | POA: Diagnosis not present

## 2016-03-28 DIAGNOSIS — E784 Other hyperlipidemia: Secondary | ICD-10-CM | POA: Diagnosis not present

## 2016-03-28 DIAGNOSIS — R188 Other ascites: Secondary | ICD-10-CM | POA: Diagnosis not present

## 2016-03-28 LAB — GLUCOSE, CAPILLARY
GLUCOSE-CAPILLARY: 106 mg/dL — AB (ref 65–99)
GLUCOSE-CAPILLARY: 223 mg/dL — AB (ref 65–99)
Glucose-Capillary: 66 mg/dL (ref 65–99)
Glucose-Capillary: 71 mg/dL (ref 65–99)
Glucose-Capillary: 191 mg/dL — ABNORMAL HIGH (ref 65–99)

## 2016-03-28 LAB — BASIC METABOLIC PANEL
ANION GAP: 8 (ref 5–15)
BUN: 59 mg/dL — ABNORMAL HIGH (ref 6–20)
CO2: 29 mmol/L (ref 22–32)
Calcium: 9.2 mg/dL (ref 8.9–10.3)
Chloride: 98 mmol/L — ABNORMAL LOW (ref 101–111)
Creatinine, Ser: 1.66 mg/dL — ABNORMAL HIGH (ref 0.61–1.24)
GFR calc non Af Amer: 39 mL/min — ABNORMAL LOW (ref >60)
GFR, EST AFRICAN AMERICAN: 45 mL/min — AB (ref >60)
Glucose, Bld: 86 mg/dL (ref 65–99)
Potassium: 3.6 mmol/L (ref 3.5–5.1)
Sodium: 135 mmol/L (ref 135–145)

## 2016-03-28 LAB — CBC
HEMATOCRIT: 36.7 % — AB (ref 39.0–52.0)
HEMOGLOBIN: 12.2 g/dL — AB (ref 13.0–17.0)
MCH: 29.3 pg (ref 26.0–34.0)
MCHC: 33.2 g/dL (ref 30.0–36.0)
MCV: 88.2 fL (ref 78.0–100.0)
Platelets: 130 10*3/uL — ABNORMAL LOW (ref 150–400)
RBC: 4.16 MIL/uL — AB (ref 4.22–5.81)
RDW: 16.2 % — ABNORMAL HIGH (ref 11.5–15.5)
WBC: 6 10*3/uL (ref 4.0–10.5)

## 2016-03-28 LAB — HEMOGLOBIN A1C
Hgb A1c MFr Bld: 7.5 % — ABNORMAL HIGH (ref 4.8–5.6)
MEAN PLASMA GLUCOSE: 169 mg/dL

## 2016-03-28 MED ORDER — DEXTROSE 50 % IV SOLN
INTRAVENOUS | Status: AC
  Administered 2016-03-28: 15 mL
  Filled 2016-03-28: qty 50

## 2016-03-28 NOTE — Care Management Obs Status (Signed)
MEDICARE OBSERVATION STATUS NOTIFICATION   Patient Details  Name: [name] MRN: [id number] Date of Birth: Mar 16, 1941   Medicare Observation Status Notification Given:  Yes    Darrold SpanWebster, Maleki Hippe Hall, RN 03/28/2016, 12:01 PM

## 2016-03-28 NOTE — Progress Notes (Signed)
Pt blood sugar 66. Asymptomatic. Gave dextrose. Blood sugar back to normal range

## 2016-03-28 NOTE — Progress Notes (Signed)
Ref: [name], MD   Subjective:  Paracentesis postponed due to scheduling conflict. Fair diuresis with oral lasix. Elevated BNP-? Chronically.  Objective:  Vital Signs in the last 24 hours: Temp:  [97.5 F (36.4 C)-97.8 F (36.6 C)] 97.5 F (36.4 C) (06/08 1421) Pulse Rate:  [75-96] 75 (06/08 1421) Cardiac Rhythm:  [-] Atrial fibrillation (06/08 1900) Resp:  [16-18] 16 (06/08 1421) BP: (105-118)/(53-97) 105/53 mmHg (06/08 1421) SpO2:  [98 %] 98 % (06/08 1421) Weight:  [67 kg (147 lb 11.3 oz)] 67 kg (147 lb 11.3 oz) (06/08 0500)  Physical Exam: BP Readings from Last 1 Encounters:  03/28/16 105/53    Wt Readings from Last 1 Encounters:  03/28/16 67 kg (147 lb 11.3 oz)    Weight change:   HEENT: [place]/AT, Eyes-Blue, PERL, EOMI, Conjunctiva-Pink, Sclera-Non-icteric Neck: + JVD, No bruit, Trachea midline. Lungs:  Clearing, Bilateral. Cardiac:  Regular rhythm, normal S1 and S2, no S3. III/VI systolic murmur. Abdomen:  Soft, non-tender. Extremities:  Trace edema present. No cyanosis. No clubbing. CNS: AxOx3, Cranial nerves grossly intact, moves all 4 extremities. Right handed. Skin: Warm and dry.   Intake/Output from previous day: 06/07 0701 - 06/08 0700 In: -  Out: 750 [Urine:750]    Lab Results: BMET    Component Value Date/Time   NA 135 03/28/2016 0300   NA 132* 03/27/2016 1900   NA 133* 02/16/2016 0649   K 3.6 03/28/2016 0300   K 3.8 03/27/2016 1900   K 3.9 02/16/2016 0649   CL 98* 03/28/2016 0300   CL 96* 03/27/2016 1900   CL 96* 02/16/2016 0649   CO2 29 03/28/2016 0300   CO2 26 03/27/2016 1900   CO2 24 02/16/2016 0649   GLUCOSE 86 03/28/2016 0300   GLUCOSE 116* 03/27/2016 1900   GLUCOSE 175* 02/16/2016 0649   BUN 59* 03/28/2016 0300   BUN 61* 03/27/2016 1900   BUN 72* 02/16/2016 0649   CREATININE 1.66* 03/28/2016 0300   CREATININE 1.47* 03/27/2016 1900   CREATININE 1.69* 02/16/2016 0649   CALCIUM 9.2 03/28/2016 0300   CALCIUM 9.4 03/27/2016 1900    CALCIUM 9.2 02/16/2016 0649   GFRNONAA 39* 03/28/2016 0300   GFRNONAA 45* 03/27/2016 1900   GFRNONAA 38* 02/16/2016 0649   GFRAA 45* 03/28/2016 0300   GFRAA 52* 03/27/2016 1900   GFRAA 44* 02/16/2016 0649   CBC    Component Value Date/Time   WBC 6.0 03/28/2016 0300   WBC 10.1 02/21/2015 1501   RBC 4.16* 03/28/2016 0300   RBC 5.05 02/21/2015 1501   HGB 12.2* 03/28/2016 0300   HGB 14.9 02/21/2015 1501   HCT 36.7* 03/28/2016 0300   HCT 45.8 02/21/2015 1501   PLT 130* 03/28/2016 0300   MCV 88.2 03/28/2016 0300   MCV 90.6 02/21/2015 1501   MCH 29.3 03/28/2016 0300   MCH 29.4 02/21/2015 1501   MCHC 33.2 03/28/2016 0300   MCHC 32.5 02/21/2015 1501   RDW 16.2* 03/28/2016 0300   LYMPHSABS 0.9 03/27/2016 1900   MONOABS 0.6 03/27/2016 1900   EOSABS 0.2 03/27/2016 1900   BASOSABS 0.0 03/27/2016 1900   HEPATIC Function Panel  Recent Labs  05/15/15 1121 02/13/16 2317 03/27/16 1900  PROT 8.4* 7.7 7.9   HEMOGLOBIN A1C No components found for: HGA1C,  MPG CARDIAC ENZYMES Lab Results  Component Value Date   CKTOTAL 72 11/01/2011   CKMB 3.0 11/01/2011   TROPONINI 0.08* 02/14/2016   TROPONINI 0.09* 02/14/2016   TROPONINI 0.07* 02/13/2016   BNP  No results for input(s): PROBNP in the last 8760 hours. TSH  Recent Labs  05/15/15 1121  TSH 5.79*   CHOLESTEROL No results for input(s): CHOL in the last 8760 hours.  Scheduled Meds: . carvedilol  3.125 mg Oral BID WC  . furosemide  80 mg Oral TID  . heparin  5,000 Units Subcutaneous Q8H  . insulin aspart  0-9 Units Subcutaneous TID WC  . insulin aspart  3 Units Subcutaneous TID WC  . metolazone  5 mg Oral Daily  . potassium chloride SA  20 mEq Oral TID  . sodium chloride flush  3 mL Intravenous Q12H  . sodium chloride flush  3 mL Intravenous Q12H  . spironolactone  12.5 mg Oral Daily   Continuous Infusions:  PRN Meds:.sodium chloride, sodium chloride flush  Assessment/Plan: Shortness of breath  Acute on  chronic systolic left heart failure Ascites CAD CABG DM, II Dilated cardiomyopathy.   Awaiting Paracentesis.     Orpah CobbAjay Danica Camarena  MD  03/28/2016, 7:52 PM

## 2016-03-29 ENCOUNTER — Observation Stay (HOSPITAL_COMMUNITY): Payer: PPO

## 2016-03-29 ENCOUNTER — Telehealth: Payer: Self-pay | Admitting: Family Medicine

## 2016-03-29 DIAGNOSIS — E784 Other hyperlipidemia: Secondary | ICD-10-CM | POA: Diagnosis not present

## 2016-03-29 DIAGNOSIS — I130 Hypertensive heart and chronic kidney disease with heart failure and stage 1 through stage 4 chronic kidney disease, or unspecified chronic kidney disease: Secondary | ICD-10-CM | POA: Diagnosis not present

## 2016-03-29 DIAGNOSIS — E1165 Type 2 diabetes mellitus with hyperglycemia: Secondary | ICD-10-CM | POA: Diagnosis not present

## 2016-03-29 DIAGNOSIS — R188 Other ascites: Secondary | ICD-10-CM | POA: Diagnosis not present

## 2016-03-29 DIAGNOSIS — I5023 Acute on chronic systolic (congestive) heart failure: Secondary | ICD-10-CM | POA: Diagnosis not present

## 2016-03-29 DIAGNOSIS — E46 Unspecified protein-calorie malnutrition: Secondary | ICD-10-CM | POA: Diagnosis not present

## 2016-03-29 LAB — GLUCOSE, CAPILLARY
GLUCOSE-CAPILLARY: 154 mg/dL — AB (ref 65–99)
Glucose-Capillary: 180 mg/dL — ABNORMAL HIGH (ref 65–99)

## 2016-03-29 MED ORDER — MAGNESIUM SULFATE IN D5W 1-5 GM/100ML-% IV SOLN
1.0000 g | Freq: Once | INTRAVENOUS | Status: AC
  Administered 2016-03-29: 1 g via INTRAVENOUS
  Filled 2016-03-29: qty 100

## 2016-03-29 MED ORDER — POTASSIUM CHLORIDE CRYS ER 20 MEQ PO TBCR: 20.0000 meq | EXTENDED_RELEASE_TABLET | Freq: Three times a day (TID) | ORAL | Status: DC

## 2016-03-29 MED ORDER — POTASSIUM CHLORIDE CRYS ER 10 MEQ PO TBCR
10.0000 meq | EXTENDED_RELEASE_TABLET | Freq: Once | ORAL | Status: AC
  Administered 2016-03-29: 10 meq via ORAL
  Filled 2016-03-29: qty 1

## 2016-03-29 NOTE — Progress Notes (Signed)
Unable to perform paracentesis as there is minimal ascites with no safe window in order to perform the procedure.  Mollee Neer E

## 2016-03-29 NOTE — Discharge Summary (Signed)
Physician Discharge Summary  Patient ID: Taylan Marez MRN: [id number] DOB/AGE: 1941/09/09 75 y.o.  Admit date: 03/27/2016 Discharge date: 03/29/2016  Admission Diagnoses: Shortness of breath  Acute on chronic systolic left heart failure Ascites CAD CABG DM, II Dilated cardiomyopathy.  Discharge Diagnoses:  Principal Problem: * Acute on chronic left systolic heart failure (HCC) * Active Problems:   DM II (diabetes mellitus, type II), controlled (HCC)   Ascites   Shortness of breath    CAD   CABG   DM, II   Dilated cardiomyopathy.   Protein calorie malnutrition, severe   CKD, III from DM, II and diuretic use  Discharged Condition: fair  Hospital Course: 75 year old male with known CAD, CABG, Type II DM and CHF has 10 pound weight gain with ascites, shortness of breath without much leg edema. He has no fever or cough. He had good diuresis with 3 times a day oral lasix with 5 mg. Metolazone use while waiting for possible paracentesis. Today his ultrasound of abdomen showed small ascites hence the paracentesis procedure was cancelled. He received extra potassium and magnesium for non-sustained VT. He was discharged home in stable condition with follow up by me and primary care doctor in 1 month or earlier as needed.  Consults: cardiology  Significant Diagnostic Studies: labs: Near normal CBC with mild thrombocytopenia. Mild hyponatremia, BUN-61 and Creatinine of 1.47 to 1.66.  EKG- Atrial fibrillation with frequent Ventricular Premature Complexes.  Chest x-ray: cardiomegaly otherwise unremarkable.  Ultrasound of abdomen: Trace ascites.  Treatments: cardiac meds: carvedilol, furosemide, spironolactone and metolazone.  Discharge Exam: Blood pressure 115/67, pulse 75, temperature 98.2 F (36.8 C), temperature source Oral, resp. rate 18, height 5' 5"  (1.651 m), weight 66.18 kg (145 lb 14.4 oz), SpO2 96 %. HEENT: [place]/AT, Eyes-Blue, PERL, EOMI, Conjunctiva-Pink,  Sclera-Non-icteric Neck: + JVD at 30 degree angle, No bruit, Trachea midline. Lungs: Clearing, Bilateral. Cardiac: Regular rhythm, normal S1 and S2, no S3. III/VI systolic murmur. Abdomen: Soft, non-tender. Extremities: Trace left lower leg edema present. No cyanosis. No clubbing. CNS: AxOx3, Cranial nerves grossly intact, moves all 4 extremities. Right handed. Skin: Warm and dry.  Disposition: 01-Home or Self Care     Medication List    TAKE these medications        camphor-menthol lotion  Commonly known as:  SARNA  Apply 1 application topically as needed (pain).     carvedilol 3.125 MG tablet  Commonly known as:  COREG  Take 1 tablet (3.125 mg total) by mouth 2 (two) times daily with a meal.     furosemide 80 MG tablet  Commonly known as:  LASIX  Take 80 mg by mouth 3 (three) times daily.     glucose blood test strip  Please check glucose BID, PRN or as directed.     insulin NPH-regular Human (70-30) 100 UNIT/ML injection  Commonly known as:  NOVOLIN 70/30  Inject 25-35 Units into the skin 2 (two) times daily with a meal. Per sliding scale if sugar is 90-95=25units or between 180-210 take 35 units per patient     Insulin Syringe-Needle U-100 31G X 5/16" 0.3 ML Misc  Commonly known as:  RELION INSULIN SYR 0.3ML/31G  Please use BID along with insulin.     metolazone 5 MG tablet  Commonly known as:  ZAROXOLYN  Take 5 mg by mouth daily.     ONE TOUCH ULTRA SYSTEM KIT w/Device Kit  1 kit by Does not apply route once. Pt should check his  glucose TID     ONETOUCH DELICA LANCETS 84T Misc  Check blood sugar three times a day     OVER THE COUNTER MEDICATION  Take 1 tablet by mouth daily. Whole Food "food form"     potassium chloride SA 20 MEQ tablet  Commonly known as:  K-DUR,KLOR-CON  Take 1 tablet (20 mEq total) by mouth 3 (three) times daily. 2 tabs in the morning and 1 tab in the evening     spironolactone 12.5 mg Tabs tablet  Commonly known as:  ALDACTONE   Take 0.5 tablets (12.5 mg total) by mouth daily.           Follow-up Information    Follow up with MCGOWEN,PHILIP H, MD. Schedule an appointment as soon as possible for a visit in 1 month.   Specialty:  Family Medicine   Contact information:   [address] [place] [address] [phone]       Follow up with [facility] S, MD. Schedule an appointment as soon as possible for a visit in 1 month.   Specialty:  Cardiology   Contact information:   [place] [address] [phone]       Signed: Birdie Riddle 03/29/2016, 1:46 PM

## 2016-03-29 NOTE — Telephone Encounter (Signed)
Tried to contact patient for hospital f/u.  Unable to reach patient / no VM set up.  TCM should go to cardiology.

## 2016-03-29 NOTE — Progress Notes (Signed)
Pt experienced 20 beat run of VTACH. Pt asymptomatic. Vital signs stable. MD notified

## 2016-03-29 NOTE — Progress Notes (Signed)
Patient discharged home with wife. IV was dc'd and was intact. Patient stated that they understood the medications and discharge instructions.

## 2016-04-01 ENCOUNTER — Encounter: Payer: Self-pay | Admitting: Family Medicine

## 2016-04-08 DIAGNOSIS — E784 Other hyperlipidemia: Secondary | ICD-10-CM | POA: Diagnosis not present

## 2016-04-08 DIAGNOSIS — E1165 Type 2 diabetes mellitus with hyperglycemia: Secondary | ICD-10-CM | POA: Diagnosis not present

## 2016-04-08 DIAGNOSIS — I5022 Chronic systolic (congestive) heart failure: Secondary | ICD-10-CM | POA: Diagnosis not present

## 2016-04-08 DIAGNOSIS — R188 Other ascites: Secondary | ICD-10-CM | POA: Diagnosis not present

## 2016-04-08 DIAGNOSIS — I361 Nonrheumatic tricuspid (valve) insufficiency: Secondary | ICD-10-CM | POA: Diagnosis not present

## 2016-04-10 ENCOUNTER — Encounter: Payer: Self-pay | Admitting: Family Medicine

## 2016-04-10 ENCOUNTER — Ambulatory Visit (INDEPENDENT_AMBULATORY_CARE_PROVIDER_SITE_OTHER): Payer: PPO | Admitting: Family Medicine

## 2016-04-10 VITALS — BP 128/72 | HR 73 | Temp 97.3°F | Resp 16 | Ht 65.0 in | Wt 151.5 lb

## 2016-04-10 DIAGNOSIS — E118 Type 2 diabetes mellitus with unspecified complications: Secondary | ICD-10-CM | POA: Diagnosis not present

## 2016-04-10 DIAGNOSIS — N183 Chronic kidney disease, stage 3 unspecified: Secondary | ICD-10-CM

## 2016-04-10 DIAGNOSIS — L03116 Cellulitis of left lower limb: Secondary | ICD-10-CM

## 2016-04-10 DIAGNOSIS — I5023 Acute on chronic systolic (congestive) heart failure: Secondary | ICD-10-CM

## 2016-04-10 NOTE — Progress Notes (Signed)
Pre visit review using our clinic review tool, if applicable. No additional management support is needed unless otherwise documented below in the visit note. 

## 2016-04-10 NOTE — Progress Notes (Signed)
OFFICE VISIT  04/10/2016   CC:  Chief Complaint  Patient presents with  . Hospitalization Follow-up     HPI:    Patient is a 75 y.o. Caucasian male who presents for hospital f/u: admitted 6/7-6/9, 2017. Admission dx: acute-on-chronic systolic L&R heart failure.  I reviewed Dr. Merrilee Jansky d/c summary today. It looks like his only med change at d/c was metolazone was changed from 2.32m qd to 569mqd. D/C wt was 145 lbs.  Home weights since d/c have been 146-148 naked.  Says breathing feels stable.  He can walk 50-100 yards on flat surface before having to stop due to SOB.  I have not seen him here since 04/2015.  Checking glucoses: fasting avg 120. 2H PP 190-200s. Gives himself 25 U humulin 70/30 at BF and 12-17 U of same insulin at supper.  He saw Dr. KaDoylene Canardor hosp f/u 2 days ago and was rx'd keflex for cellulitis on L lower ext.  Past Medical History  Diagnosis Date  . Atrial fibrillation ([facility]    ?GI bleed in past, plus pt tends to "self adjust/manage" his meds w/out regard for MD's guidance, so he has NOT been on anticoagulant for this.  . Coronary artery disease   . Hypertension   . Chronic systolic heart failure ([facility] [place]  . High cholesterol   . Pulmonary embolism (HCKins[facility]1980's or 1990's  . Pneumonia   . 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 Gout   . Chronic renal insufficiency, stage III (moderate)     stage II/III  . Heart murmur   . Chronic bronchitis ([facility]CM[place]    "used to get it often; haven't had it for awhile now" (04/12/2013)  . Type II diabetes mellitus ([facility]  . Postpolio syndrome 1951    "affected the right side of my body; been weak on that side ever since" (04/12/2013)  . Severe mitral regurgitation 2013  . Severe tricuspid regurgitation 2013  . 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 GrKaty Fitch . Umbilical hernia 02/18/5725  Symptomatic--Dr. TsGeorgette Dovero repair if cleared by cardiologist  . Subclinical  hypothyroidism   . [facility]    Past Surgical History  Procedure Laterality Date  . Retinal laser procedure Bilateral     "more than once on the left" (04/12/2013)  . Cataract extraction w/ intraocular lens  implant, bilateral Bilateral 2005  . Appendectomy  ~ 1962  . Cardiac catheterization      "I've had a few since 1989" (04/12/2013)  . Cervical discectomy  1984    C6-7  . 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 Exploratory laparotomy  ~ 1962    "took out my appendix while there; bowel was inflammed" (04/12/2013)  . Hydrocele excision / repair      "couple times" (04/12/2013)  . Transthoracic echocardiogram  2013;12/2014;01/2016    LV dilation, EF 30-35%, diffuse hypokinesis, grade I diast dysfxn, severe mitral and tricuspid regurg, LAE and RAE--biventricular dysfunction moderate/severe. 01/2016 dilated LV with akinesis/hypokinesis diffusely, severe RV dysfxn, severe mitral regurg, severe PA HTN  . Paracentesis  2013-2014    Multiple times, usually pulling off 6-7 liters of fluid  . Cardiac catheterization  12/2012    Severe native CAD, patent bypass grafts x 3, EF 25-30%--med mgmt  . Left and right  heart catheterization with coronary/graft angiogram  01/13/2013    Procedure: LEFT AND RIGHT HEART CATHETERIZATION WITH Beatrix Fetters;  Surgeon: Birdie Riddle, MD;  Location: [facility];  Service: Cardiovascular;;    Outpatient Prescriptions Prior to Visit  Medication Sig Dispense Refill  . Blood Glucose Monitoring Suppl (ONE TOUCH ULTRA SYSTEM KIT) W/DEVICE KIT 1 kit by Does not apply route once. Pt should check his glucose TID 1 each 0  . camphor-menthol (SARNA) lotion Apply 1 application topically as needed (pain).    . carvedilol (COREG) 3.125 MG tablet Take 1 tablet (3.125 mg total) by mouth 2 (two) times daily with a meal.    . furosemide (LASIX) 80 MG tablet Take 80 mg by mouth 3 (three) times daily.    Marland Kitchen glucose  blood test strip Please check glucose BID, PRN or as directed. 100 each 3  . insulin NPH-regular Human (NOVOLIN 70/30) (70-30) 100 UNIT/ML injection Inject 25-35 Units into the skin 2 (two) times daily with a meal. Per sliding scale if sugar is 90-95=25units or between 180-210 take 35 units per patient    . Insulin Syringe-Needle U-100 (RELION INSULIN SYR 0.3ML/31G) 31G X 5/16" 0.3 ML MISC Please use BID along with insulin. 100 each 11  . metolazone (ZAROXOLYN) 5 MG tablet Take 5 mg by mouth daily.    Glory Rosebush DELICA LANCETS 33I MISC Check blood sugar three times a day 100 each 11  . OVER THE COUNTER MEDICATION Take 1 tablet by mouth daily. Whole Food "food form"    . potassium chloride SA (K-DUR,KLOR-CON) 20 MEQ tablet Take 1 tablet (20 mEq total) by mouth 3 (three) times daily. 2 tabs in the morning and 1 tab in the evening    . spironolactone (ALDACTONE) 12.5 mg TABS Take 0.5 tablets (12.5 mg total) by mouth daily. 15 tablet 1   No facility-administered medications prior to visit.    Allergies  Allergen Reactions  . Adhesive [Tape] Other (See Comments)    Pulls skin off. Please use "paper" tape.  . Sulfa Antibiotics Rash    ROS As per HPI  PE: Blood pressure 128/72, pulse 73, temperature 97.3 F (36.3 C), temperature source Oral, resp. rate 16, height _0  (1.651 m), weight 151 lb 8 oz (68.72 kg), SpO2 100 %. Gen: Alert, well appearing.  Patient is oriented to person, place, time, and situation. CV: Irreg irreg rhythm, 2/6 harsh systolic murmur, no rub or gallop. Chest is clear, no wheezing or rales. Normal symmetric air entry throughout both lung fields. No chest wall deformities or tenderness. ABD: soft, NT, mild amount of ascites.  No HSM or mass or bruit.  Normal BS. EXT: 1+ pitting edema both LL's.  Left pretibial surface with some mottled erythema and 3 small superficial erosions with scab formation.  This is covered by gauze with antibiotic ointment.   LABS:  Lab Results   Component Value Date   TSH 5.79* 05/15/2015   Lab Results  Component Value Date   WBC 6.0 03/28/2016   HGB 12.2* 03/28/2016   HCT 36.7* 03/28/2016   MCV 88.2 03/28/2016   PLT 130* 03/28/2016   Lab Results  Component Value Date   CREATININE 1.66* 03/28/2016   BUN 59* 03/28/2016   NA 135 03/28/2016   K 3.6 03/28/2016   CL 98* 03/28/2016   CO2 29 03/28/2016   Lab Results  Component Value Date   ALT 15* 03/27/2016   AST 33 03/27/2016   ALKPHOS 98 03/27/2016  BILITOT 1.4* 03/27/2016   Lab Results  Component Value Date   CHOL 198 07/04/2014   Lab Results  Component Value Date   HDL 40.40 07/04/2014   Lab Results  Component Value Date   LDLCALC 139* 07/04/2014   Lab Results  Component Value Date   TRIG 95.0 07/04/2014   Lab Results  Component Value Date   CHOLHDL 5 07/04/2014   Lab Results  Component Value Date   HGBA1C 7.5* 03/27/2016   IMPRESSION AND PLAN:  Hospital follow up:  1) Acute-on-chronic biventricular CHF: stable on current regimen. Check lytes/cr today.  2) DM 2: latest A1c was done in hospital on 03/27/16 and was 7.5%.   With this patient's tendency to self adjust meds + sometimes have hypoglycemia, I will not make any insulin dosing changes today---Hba1c is at goal as far as I'm considered.  3) CRI stage III:  Recheck BMET today.  4) Left lower leg cellulitis: pt states he is already improving on cephalexin.  He is set to f/u with Dr. Doylene Canard for this problem.  An After Visit Summary was printed and given to the patient.  FOLLOW UP: Return in about 3 months (around 07/11/2016) for routine chronic illness f/u (30 min).  Signed:  Crissie Sickles, MD           04/10/2016

## 2016-04-11 LAB — BASIC METABOLIC PANEL
BUN: 57 mg/dL — AB (ref 6–23)
CO2: 32 mEq/L (ref 19–32)
CREATININE: 1.53 mg/dL — AB (ref 0.40–1.50)
Calcium: 9.4 mg/dL (ref 8.4–10.5)
Chloride: 96 mEq/L (ref 96–112)
GFR: 47.27 mL/min — AB (ref >60.00)
Glucose, Bld: 128 mg/dL — ABNORMAL HIGH (ref 70–99)
Potassium: 3.9 mEq/L (ref 3.5–5.1)
Sodium: 134 mEq/L — ABNORMAL LOW (ref 135–145)

## 2016-04-12 ENCOUNTER — Telehealth: Payer: Self-pay | Admitting: *Deleted

## 2016-04-12 ENCOUNTER — Encounter: Payer: Self-pay | Admitting: Family Medicine

## 2016-04-12 NOTE — Telephone Encounter (Signed)
Spoke with patient reviewed lab results. 

## 2016-06-17 DIAGNOSIS — E1165 Type 2 diabetes mellitus with hyperglycemia: Secondary | ICD-10-CM | POA: Diagnosis not present

## 2016-06-17 DIAGNOSIS — E784 Other hyperlipidemia: Secondary | ICD-10-CM | POA: Diagnosis not present

## 2016-06-17 DIAGNOSIS — R188 Other ascites: Secondary | ICD-10-CM | POA: Diagnosis not present

## 2016-06-17 DIAGNOSIS — I5022 Chronic systolic (congestive) heart failure: Secondary | ICD-10-CM | POA: Diagnosis not present

## 2016-06-20 ENCOUNTER — Other Ambulatory Visit: Payer: Self-pay | Admitting: Family Medicine

## 2016-07-01 ENCOUNTER — Telehealth: Payer: Self-pay | Admitting: Family Medicine

## 2016-07-01 MED ORDER — GLUCOSE BLOOD VI STRP: ORAL_STRIP | 3 refills | Status: DC

## 2016-07-01 NOTE — Telephone Encounter (Signed)
[facility]

## 2016-07-01 NOTE — Telephone Encounter (Signed)
[facility]

## 2016-07-01 NOTE — Telephone Encounter (Signed)
Patient is almost out of strips.  

## 2016-07-02 ENCOUNTER — Other Ambulatory Visit: Payer: Self-pay | Admitting: *Deleted

## 2016-07-02 NOTE — Telephone Encounter (Signed)
Patient glucose test strips sent by Dr Milinda CaveMcgowen through [employer].

## 2016-07-11 ENCOUNTER — Ambulatory Visit (INDEPENDENT_AMBULATORY_CARE_PROVIDER_SITE_OTHER): Payer: PPO | Admitting: Family Medicine

## 2016-07-11 ENCOUNTER — Encounter: Payer: Self-pay | Admitting: Family Medicine

## 2016-07-11 VITALS — BP 119/73 | HR 69 | Temp 97.8°F | Resp 16 | Wt 136.4 lb

## 2016-07-11 DIAGNOSIS — N183 Chronic kidney disease, stage 3 unspecified: Secondary | ICD-10-CM

## 2016-07-11 DIAGNOSIS — E118 Type 2 diabetes mellitus with unspecified complications: Secondary | ICD-10-CM

## 2016-07-11 DIAGNOSIS — D696 Thrombocytopenia, unspecified: Secondary | ICD-10-CM | POA: Diagnosis not present

## 2016-07-11 DIAGNOSIS — Z794 Long term (current) use of insulin: Secondary | ICD-10-CM

## 2016-07-11 DIAGNOSIS — D649 Anemia, unspecified: Secondary | ICD-10-CM | POA: Diagnosis not present

## 2016-07-11 DIAGNOSIS — E038 Other specified hypothyroidism: Secondary | ICD-10-CM

## 2016-07-11 DIAGNOSIS — I5042 Chronic combined systolic (congestive) and diastolic (congestive) heart failure: Secondary | ICD-10-CM

## 2016-07-11 DIAGNOSIS — E039 Hypothyroidism, unspecified: Secondary | ICD-10-CM

## 2016-07-11 LAB — BASIC METABOLIC PANEL
BUN: 78 mg/dL — AB (ref 6–23)
CALCIUM: 9.6 mg/dL (ref 8.4–10.5)
CO2: 32 meq/L (ref 19–32)
Chloride: 93 mEq/L — ABNORMAL LOW (ref 96–112)
Creatinine, Ser: 1.49 mg/dL (ref 0.40–1.50)
GFR: 48.7 mL/min — AB (ref >60.00)
GLUCOSE: 73 mg/dL (ref 70–99)
Potassium: 3.8 mEq/L (ref 3.5–5.1)
SODIUM: 132 meq/L — AB (ref 135–145)

## 2016-07-11 LAB — TSH: TSH: 7.75 u[IU]/mL — AB (ref 0.35–4.50)

## 2016-07-11 LAB — CBC WITH DIFFERENTIAL/PLATELET
BASOS ABS: 0.1 10*3/uL (ref 0.0–0.1)
Basophils Relative: 1 % (ref 0.0–3.0)
EOS ABS: 0.2 10*3/uL (ref 0.0–0.7)
EOS PCT: 3.9 % (ref 0.0–5.0)
HEMATOCRIT: 39.1 % (ref 39.0–52.0)
Hemoglobin: 13.4 g/dL (ref 13.0–17.0)
Lymphocytes Relative: 12.1 % (ref 12.0–46.0)
Lymphs Abs: 0.8 10*3/uL (ref 0.7–4.0)
MCHC: 34.3 g/dL (ref 30.0–36.0)
MCV: 89.4 fl (ref 78.0–100.0)
MONOS PCT: 10.7 % (ref 3.0–12.0)
Monocytes Absolute: 0.7 10*3/uL (ref 0.1–1.0)
NEUTROS ABS: 4.5 10*3/uL (ref 1.4–7.7)
NEUTROS PCT: 72.3 % (ref 43.0–77.0)
Platelets: 116 10*3/uL — ABNORMAL LOW (ref 150.0–400.0)
RBC: 4.37 Mil/uL (ref 4.22–5.81)
RDW: 15.7 % — ABNORMAL HIGH (ref 11.5–15.5)
WBC: 6.2 10*3/uL (ref 4.0–10.5)

## 2016-07-11 LAB — MICROALBUMIN / CREATININE URINE RATIO
CREATININE, U: 31.4 mg/dL
Microalb Creat Ratio: 2.2 mg/g (ref 0.0–30.0)

## 2016-07-11 LAB — HEMOGLOBIN A1C: Hgb A1c MFr Bld: 7.2 % — ABNORMAL HIGH (ref 4.6–6.5)

## 2016-07-11 NOTE — Progress Notes (Signed)
OFFICE VISIT  07/11/2016   CC:  Chief Complaint  Patient presents with  . Follow-up    HPI:    Patient is a 75 y.o. Caucasian male who presents for 3 mo f/u HTN, chronic combined systolic/diastolic CHF, DM 2. Feet: no burning, tingling, or numbness.  + past hx of diabetic ulcer on L big toe. Glucoses: "pretty fair most of the time".  No log brought.  Rare reading around 70 and he feels hypoglycemic with this.  Breathing feels fine, says he doesn't feel like he's fluid overloaded.  He last saw his heart MD around a month ago.  Nothing was changed per pt's report.  No  CP or SOB.  He has lost a significant amount of wt and says he is near his baseline wt of 133 lbs.  Past Medical History:  Diagnosis Date  . Atrial fibrillation ([place])    ?GI bleed in past, plus pt tends to "self adjust/manage" his meds w/out regard for MD's guidance, so he has NOT been on anticoagulant for this.  . CHF (congestive heart failure) ([place])   . Chronic bronchitis ([place])    "used to get it often; haven't had it for awhile now" (04/12/2013)  . Chronic renal insufficiency, stage III (moderate)    GFR @ 50  . Chronic systolic heart failure ([place])   . Coronary artery disease   . Gout   . Heart murmur   . High cholesterol   . Hypertension   . Pneumonia   . Positive TB test    "but I don't have it"  . Postpolio syndrome 1951   "affected the right side of my body; been weak on that side ever since" (04/12/2013)  . 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 Katy Fitch)  . Pulmonary embolism ([place]) 1980's or 1990's  . Severe mitral regurgitation 2013  . Severe tricuspid regurgitation 2013  . Subclinical hypothyroidism   . Type II diabetes mellitus ([place])   . Umbilical hernia 04/176   Symptomatic--Dr. Georgette Dover to repair if cleared by cardiologist    Past Surgical History:  Procedure Laterality Date  . APPENDECTOMY  ~ 1962  . CARDIAC CATHETERIZATION     "I've had a few  since 1989" (04/12/2013)  . CARDIAC CATHETERIZATION  12/2012   Severe native CAD, patent bypass grafts x 3, EF 25-30%--med mgmt  . CATARACT EXTRACTION W/ INTRAOCULAR LENS  IMPLANT, BILATERAL Bilateral 2005  . CERVICAL DISCECTOMY  1984   C6-7  . CORONARY ARTERY BYPASS GRAFT  01/1988; 05/1999   CABG X 3; CABG X 2  . EXPLORATORY LAPAROTOMY  ~ 1962   "took out my appendix while there; bowel was inflammed" (04/12/2013)  . HYDROCELE EXCISION / REPAIR     "couple times" (04/12/2013)  . LEFT AND RIGHT HEART CATHETERIZATION WITH CORONARY/GRAFT ANGIOGRAM  01/13/2013   Procedure: LEFT AND RIGHT HEART CATHETERIZATION WITH Beatrix Fetters;  Surgeon: Birdie Riddle, MD;  Location: [facility];  Service: Cardiovascular;;  . PARACENTESIS  2013-2014   Multiple times, usually pulling off 6-7 liters of fluid  . RETINAL LASER PROCEDURE Bilateral    "more than once on the left" (04/12/2013)  . TRANSTHORACIC ECHOCARDIOGRAM  2013;12/2014;01/2016   LV dilation, EF 30-35%, diffuse hypokinesis, grade I diast dysfxn, severe mitral and tricuspid regurg, LAE and RAE--biventricular dysfunction moderate/severe. 01/2016 dilated LV with akinesis/hypokinesis diffusely, severe RV dysfxn, severe mitral regurg, severe PA HTN    Outpatient Medications Prior to Visit  Medication Sig Dispense Refill  . Blood Glucose Monitoring Suppl (ONE TOUCH ULTRA SYSTEM KIT) W/DEVICE KIT 1 kit by Does not apply route once. Pt should check his glucose TID 1 each 0  . camphor-menthol (SARNA) lotion Apply 1 application topically as needed (pain).    . carvedilol (COREG) 3.125 MG tablet Take 1 tablet (3.125 mg total) by mouth 2 (two) times daily with a meal.    . furosemide (LASIX) 80 MG tablet Take 80 mg by mouth 3 (three) times daily.    Marland Kitchen glucose blood test strip Please check glucose BID, PRN or as directed. 100 each 3  . insulin NPH-regular Human (NOVOLIN 70/30) (70-30) 100 UNIT/ML injection Inject 25-35 Units into the skin 2 (two) times  daily with a meal. Per sliding scale if sugar is 90-95=25units or between 180-210 take 35 units per patient    . Insulin Syringe-Needle U-100 (RELION INSULIN SYR 0.3ML/31G) 31G X 5/16" 0.3 ML MISC Please use BID along with insulin. 100 each 11  . metolazone (ZAROXOLYN) 5 MG tablet Take 5 mg by mouth daily.    Glory Rosebush DELICA LANCETS 81W MISC USE ONE LANCET TO CHECK GLUCOSE THREE TIMES DAILY 100 each 11  . OVER THE COUNTER MEDICATION Take 1 tablet by mouth daily. Whole Food "food form"    . potassium chloride SA (K-DUR,KLOR-CON) 20 MEQ tablet Take 1 tablet (20 mEq total) by mouth 3 (three) times daily. 2 tabs in the morning and 1 tab in the evening    . spironolactone (ALDACTONE) 12.5 mg TABS Take 0.5 tablets (12.5 mg total) by mouth daily. 15 tablet 1  . cephALEXin (KEFLEX) 500 MG capsule Take 1 capsule by mouth 3 (three) times daily.     No facility-administered medications prior to visit.     Allergies  Allergen Reactions  . Adhesive [Tape] Other (See Comments)    Pulls skin off. Please use "paper" tape.  . Sulfa Antibiotics Rash    ROS As per HPI  PE: Blood pressure 119/73, pulse 69, temperature 97.8 F (36.6 C), temperature source Oral, resp. rate 16, weight 136 lb 6.4 oz (61.9 kg), SpO2 97 %. Gen: Alert, well appearing.  Patient is oriented to person, place, time, and situation. CV: irreg irreg, 3/6 harsh systolic murmur, no rub or gallop. Chest is clear, no wheezing or rales. Normal symmetric air entry throughout both lung fields. No chest wall deformities or tenderness. EXT: trace bilat LE pitting edema with skin changes of chronic stasis but no sign of active dermatitis or cellulitis. Foot exam -  no swelling, tenderness or skin or vascular lesions. Color and temperature is normal. Sensation is intact except for plantar surfaces of big toe and 1st metatarsal head on both sides. Peripheral pulses are palpable. Toenails are normal.   LABS:  Lab Results  Component Value Date    TSH 5.79 (H) 05/15/2015   Lab Results  Component Value Date   WBC 6.0 03/28/2016   HGB 12.2 (L) 03/28/2016   HCT 36.7 (L) 03/28/2016   MCV 88.2 03/28/2016   PLT 130 (L) 03/28/2016   Lab Results  Component Value Date   CREATININE 1.53 (H) 04/10/2016   BUN 57 (H) 04/10/2016   NA 134 (L) 04/10/2016   K 3.9 04/10/2016   CL 96 04/10/2016   CO2 32 04/10/2016   Lab Results  Component Value Date   ALT 15 (L) 03/27/2016   AST 33 03/27/2016   ALKPHOS 98 03/27/2016   BILITOT 1.4 (H) 03/27/2016  Lab Results  Component Value Date   CHOL 198 07/04/2014   Lab Results  Component Value Date   HDL 40.40 07/04/2014   Lab Results  Component Value Date   LDLCALC 139 (H) 07/04/2014   Lab Results  Component Value Date   TRIG 95.0 07/04/2014   Lab Results  Component Value Date   CHOLHDL 5 07/04/2014   Lab Results  Component Value Date   HGBA1C 7.5 (H) 03/27/2016    IMPRESSION AND PLAN:  1) DM 2, hx of fair control.  Hx of questionable self management tactics. 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 HbA1c today. Reminded pt of need for diabetic eye exam. Urine microalb/cr today.  2) CRI stage III: check BMET today.  3) Recent hx of normocytic anemia and mild thrombocytopenia (blood draw while in hosp for CHF 03/2016): repeat CBC today.  4) Hx of subclinical hypothyroidism: last TSH was 1 yr ago.  Recheck TSH today.  He declined flu vaccine today.  An After Visit Summary was printed and given to the patient.  FOLLOW UP: Return in about 3 months (around 10/10/2016) for routine chronic illness f/u.  Signed:  Crissie Sickles, MD           07/11/2016

## 2016-07-11 NOTE — Progress Notes (Signed)
Pre visit review using our clinic review tool, if applicable. No additional management support is needed unless otherwise documented below in the visit note. 

## 2016-07-12 ENCOUNTER — Other Ambulatory Visit (INDEPENDENT_AMBULATORY_CARE_PROVIDER_SITE_OTHER): Payer: PPO

## 2016-07-12 DIAGNOSIS — R946 Abnormal results of thyroid function studies: Secondary | ICD-10-CM

## 2016-07-12 LAB — T4, FREE: Free T4: 1.03 ng/dL (ref 0.60–1.60)

## 2016-07-13 LAB — T3: T3, Total: 77 ng/dL (ref 76–181)

## 2016-07-29 ENCOUNTER — Encounter: Payer: Self-pay | Admitting: Family Medicine

## 2016-07-29 DIAGNOSIS — H02055 Trichiasis without entropian left lower eyelid: Secondary | ICD-10-CM | POA: Diagnosis not present

## 2016-07-29 DIAGNOSIS — E113593 Type 2 diabetes mellitus with proliferative diabetic retinopathy without macular edema, bilateral: Secondary | ICD-10-CM | POA: Diagnosis not present

## 2016-07-29 DIAGNOSIS — H35372 Puckering of macula, left eye: Secondary | ICD-10-CM | POA: Diagnosis not present

## 2016-07-29 DIAGNOSIS — Z961 Presence of intraocular lens: Secondary | ICD-10-CM | POA: Diagnosis not present

## 2016-07-29 LAB — HM DIABETES EYE EXAM

## 2016-08-05 ENCOUNTER — Telehealth: Payer: Self-pay

## 2016-08-05 NOTE — Telephone Encounter (Signed)
Pharmacy requesting new Rx for Patient testing 2-3 times daily for One touch Ultra Blue test strip. Also include Dx code. Please Advise.

## 2016-08-05 NOTE — Telephone Encounter (Signed)
Rx written.

## 2016-09-04 ENCOUNTER — Ambulatory Visit: Payer: PPO | Admitting: Physician Assistant

## 2016-09-04 ENCOUNTER — Telehealth: Payer: Self-pay | Admitting: Family Medicine

## 2016-09-04 ENCOUNTER — Ambulatory Visit (INDEPENDENT_AMBULATORY_CARE_PROVIDER_SITE_OTHER): Payer: PPO | Admitting: Physician Assistant

## 2016-09-04 VITALS — BP 118/76 | HR 79 | Temp 97.9°F | Resp 17 | Ht 65.0 in | Wt 147.0 lb

## 2016-09-04 DIAGNOSIS — D696 Thrombocytopenia, unspecified: Secondary | ICD-10-CM | POA: Diagnosis not present

## 2016-09-04 DIAGNOSIS — T148XXA Other injury of unspecified body region, initial encounter: Secondary | ICD-10-CM | POA: Diagnosis not present

## 2016-09-04 DIAGNOSIS — Z0289 Encounter for other administrative examinations: Secondary | ICD-10-CM

## 2016-09-04 LAB — POCT CBC
Granulocyte percent: 78.5 %G (ref 37–80)
HCT, POC: 36.7 % — AB (ref 43.5–53.7)
HEMOGLOBIN: 12.8 g/dL — AB (ref 14.1–18.1)
LYMPH, POC: 1.2 (ref 0.6–3.4)
MCH, POC: 31.5 pg — AB (ref 27–31.2)
MCHC: 34.9 g/dL (ref 31.8–35.4)
MCV: 90.4 fL (ref 80–97)
MID (cbc): 0.8 (ref 0–0.9)
MPV: 8.6 fL (ref 0–99.8)
PLATELET COUNT, POC: 103 10*3/uL — AB (ref 142–424)
POC Granulocyte: 7.4 — AB (ref 2–6.9)
POC LYMPH PERCENT: 12.6 %L (ref 10–50)
POC MID %: 8.9 % (ref 0–12)
RBC: 4.06 M/uL — AB (ref 4.69–6.13)
RDW, POC: 16.2 %
WBC: 9.4 10*3/uL (ref 4.6–10.2)

## 2016-09-04 NOTE — Telephone Encounter (Signed)
Patient was seen & treated by EMT yesterday. They advised that patient be seen by PCP today. Tried to collect more information however patient was difficult to understand.

## 2016-09-04 NOTE — Progress Notes (Signed)
09/06/2016 8:11 AM   DOB: 08-16-1941 / MRN: [id number]  SUBJECTIVE:  [name] is a 75 y.o. male presenting for a fall that occurred yesterday.  Reports he lost his balance yesterday while reaching down to pick up a water bottle.  States that he could not get back up and had to crawol to the phone and scraped his elbows up in the process.  He is here to have his bandages changed and his wound evaluated.  He has not taken any medication for the abrasions and denies any pain.     He is allergic to adhesive [tape] and sulfa antibiotics.   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    He  reports that he quit smoking about 42 years ago. His smoking use included Cigarettes. He has a 5.00 pack-year smoking history. He quit smokeless tobacco use about 46 years ago. His smokeless tobacco use included Chew. He reports that he does not drink alcohol or use drugs. He  reports that he does not currently engage in sexual activity. The patient  has a past surgical history that includes Retinal laser procedure (Bilateral); Cataract extraction w/ intraocular lens  implant, bilateral (Bilateral, 2005); Appendectomy (~ 1962); Cardiac catheterization; Cervical discectomy (1984); Coronary artery bypass graft (01/1988; 05/1999); Exploratory laparotomy (~ 1962); Hydrocele excision / repair; transthoracic echocardiogram (2013;12/2014;01/2016);  Paracentesis (2013-2014); Cardiac catheterization (12/2012); and left and right heart catheterization with coronary/graft angiogram (01/13/2013).  His family history includes Cancer in his sister; Parkinson's disease in his brother.  ROS  The problem list and medications were reviewed and updated by myself where necessary and exist elsewhere in the encounter.   OBJECTIVE:  BP 118/76 (BP Location: Right Arm, Patient Position: Sitting, Cuff Size: Normal)   Pulse 79   Temp 97.9 F (36.6 C) (Oral)   Resp 17   Ht 5\' 5"  (1.651 m)   Wt 147 lb (66.7 kg)   SpO2 100%   BMI 24.46 kg/m   Physical Exam        CBC Latest Ref Rng & Units 09/04/2016 07/11/2016 03/28/2016  WBC 4.6 - 10.2 K/uL 9.4 6.2 6.0  Hemoglobin 14.1 - 18.1 g/dL 12.8(A) 13.4 12.2(L)  Hematocrit 43.5 - 53.7 % 36.7(A) 39.1 36.7(L)  Platelets 150.0 - 400.0 K/uL - 116.0(L) 130(L)     Results for orders placed or performed in visit on 09/04/16 (from the past 72 hour(s))  POCT CBC     Status: Abnormal   Collection Time: 09/04/16  2:49 PM  Result Value Ref Range   WBC 9.4 4.6 - 10.2 K/uL   Lymph, poc 1.2 0.6 - 3.4   POC LYMPH PERCENT 12.6 10 - 50 %L   MID (cbc) 0.8 0 - 0.9   POC MID % 8.9 0 - 12 %M   POC Granulocyte 7.4 (A) 2 - 6.9   Granulocyte percent 78.5 37 - 80 %G   RBC 4.06 (A) 4.69 - 6.13 M/uL   Hemoglobin 12.8 (A) 14.1 - 18.1 g/dL   HCT, POC 40.936.7 (A) 81.143.5 - 53.7 %   MCV 90.4  80 - 97 fL   MCH, POC 31.5 (A) 27 - 31.2 pg   MCHC 34.9 31.8 - 35.4 g/dL   RDW, POC 14.716.2 %   Platelet Count, POC 103 (A) 142 - 424 K/uL   MPV 8.6 0 - 99.8 fL    No results found.  ASSESSMENT AND PLAN  [name] was seen today for elbow injury.  Diagnoses and all orders for this visit:  Skin abrasion Comments: Wounds dressed here with mupirocin.  Will see him back in two days for dressing change.  Historically low platelets and at 103 today.  Will reassess at f/u.   Thrombocytopenia (HCC) -     POCT CBC -      Protime-INR    The patient is advised to call or return to clinic if he does not see an improvement in symptoms, or to seek the care of the closest emergency department if he worsens with the above plan.   Deliah BostonMichael Lenzie Montesano, MHS, PA-C [facility] 09/06/2016 8:11 AM

## 2016-09-04 NOTE — Telephone Encounter (Signed)
Appointment made for patient for follow up from [facility].

## 2016-09-04 NOTE — Patient Instructions (Signed)
     IF you received an x-ray today, you will receive an invoice from [facility]. Please contact [facility] at [phone] with questions or concerns regarding your invoice.   IF you received labwork today, you will receive an invoice from [facility]. Please contact [facility] at [phone] with questions or concerns regarding your invoice.   Our billing staff will not be able to assist you with questions regarding bills from these companies.  You will be contacted with the lab results as soon as they are available. The fastest way to get your results is to activate your My Chart account. Instructions are located on the last page of this paperwork. If you have not heard from us regarding the results in 2 weeks, please contact this office.      

## 2016-09-06 ENCOUNTER — Ambulatory Visit (INDEPENDENT_AMBULATORY_CARE_PROVIDER_SITE_OTHER): Payer: PPO | Admitting: Physician Assistant

## 2016-09-06 ENCOUNTER — Ambulatory Visit: Payer: PPO

## 2016-09-06 VITALS — BP 126/68 | HR 84 | Temp 97.5°F | Resp 17 | Ht 63.5 in | Wt 149.8 lb

## 2016-09-06 DIAGNOSIS — T148XXA Other injury of unspecified body region, initial encounter: Secondary | ICD-10-CM

## 2016-09-06 NOTE — Patient Instructions (Signed)
     IF you received an x-ray today, you will receive an invoice from [facility]. Please contact [facility] at [phone] with questions or concerns regarding your invoice.   IF you received labwork today, you will receive an invoice from [facility]. Please contact [facility] at [phone] with questions or concerns regarding your invoice.   Our billing staff will not be able to assist you with questions regarding bills from these companies.  You will be contacted with the lab results as soon as they are available. The fastest way to get your results is to activate your My Chart account. Instructions are located on the last page of this paperwork. If you have not heard from us regarding the results in 2 weeks, please contact this office.      

## 2016-09-06 NOTE — Progress Notes (Signed)
   09/06/2016 2:18 PM   DOB: 06-23-1941 / MRN: [id number]  SUBJECTIVE:  [name] is a 75 y.o. male presenting for recheck of arm abrasions.  He denies any exquisite tenderness today as well as bony pain.  Reports that he is feeling somewhat better today than previous.   He is allergic to adhesive [tape] and sulfa antibiotics.   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    He  reports that he quit smoking about 42 years ago. His smoking use included Cigarettes. He has a 5.00 pack-year smoking history. He quit smokeless tobacco use about 46 years ago. His smokeless tobacco use included Chew. He reports that he does not drink alcohol or use drugs. He  reports that he does not currently engage in sexual activity. The patient  has a past surgical history that includes Retinal laser procedure (Bilateral); Cataract extraction w/ intraocular lens  implant, bilateral (Bilateral, 2005); Appendectomy (~ 1962); Cardiac catheterization; Cervical discectomy (1984); Coronary artery bypass graft (01/1988; 05/1999); Exploratory laparotomy (~ 1962); Hydrocele excision / repair; transthoracic echocardiogram (2013;12/2014;01/2016); Paracentesis (2013-2014); Cardiac catheterization (12/2012); and left and right heart catheterization with coronary/graft angiogram (01/13/2013).  His family history includes Cancer in his sister; Parkinson's disease in his  brother.  Review of Systems  Constitutional: Negative for chills and fever.  Musculoskeletal: Negative for myalgias.  Skin: Positive for rash (abrasions). Negative for itching.  Neurological: Negative for dizziness.    The problem list and medications were reviewed and updated by myself where necessary and exist elsewhere in the encounter.   OBJECTIVE:  BP 126/68 (BP Location: Right Arm, Patient Position: Sitting, Cuff Size: Normal)   Pulse 84   Temp 97.5 F (36.4 C) (Axillary)   Resp (!) 8   Ht 5' 3.5" (1.613 m)   Wt 149 lb 12.8 oz (67.9 kg)   SpO2 99%   BMI 26.12 kg/m   Physical Exam  Constitutional: He is oriented to person, place, and time.  Cardiovascular: Normal rate and regular rhythm.   Pulmonary/Chest: Effort normal and breath sounds normal.  Musculoskeletal: Normal range of motion.  Neurological: He is alert and oriented to person, place, and time.  Skin: Skin is warm and dry. Rash (multiple abrasions, largely unchanged, negative for exquisite tnederness. ) noted.     No results found.  ASSESSMENT AND PLAN  [name] was seen today for elbow pain and fatigue.  Diagnoses and all orders for this visit:  Skin abrasion: He is doing well.  Advised I see him back in three days however he wants to see me in five.  Appointment scheduled.      The patient is advised to call or return to clinic if he does not see an improvement in symptoms, or to seek the care of the closest emergency department if he worsens with the above plan.   [name], MHS, PA-C [facility] 09/06/2016 2:18 PM

## 2016-09-11 ENCOUNTER — Ambulatory Visit: Payer: PPO

## 2016-09-13 ENCOUNTER — Encounter: Payer: Self-pay | Admitting: Physician Assistant

## 2016-09-13 ENCOUNTER — Ambulatory Visit (INDEPENDENT_AMBULATORY_CARE_PROVIDER_SITE_OTHER): Payer: PPO | Admitting: Physician Assistant

## 2016-09-13 VITALS — BP 100/70 | HR 78 | Temp 97.4°F | Resp 18

## 2016-09-13 DIAGNOSIS — T148XXA Other injury of unspecified body region, initial encounter: Secondary | ICD-10-CM | POA: Diagnosis not present

## 2016-09-13 NOTE — Progress Notes (Signed)
   09/13/2016 2:52 PM   DOB: 03-Aug-1941 / MRN: [id number]  SUBJECTIVE:  [name] is a 75 y.o. male presenting for reeval of skin abrasions.  He reports the wound "feel fine" and he is not having any pain today.  He is eating normally for him an denies fever.   He is allergic to adhesive [tape] and sulfa antibiotics.   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    He  reports that he quit smoking about 42 years ago. His smoking use included Cigarettes. He has a 5.00 pack-year smoking history. He quit smokeless tobacco use about 46 years ago. His smokeless tobacco use included Chew. He reports that he does not drink alcohol or use drugs. He  reports that he does not currently engage in sexual activity. The patient  has a past surgical history that includes Retinal laser procedure (Bilateral); Cataract extraction w/ intraocular lens  implant, bilateral (Bilateral, 2005); Appendectomy (~ 1962); Cardiac catheterization; Cervical discectomy (1984); Coronary artery bypass graft (01/1988; 05/1999); Exploratory laparotomy (~ 1962); Hydrocele excision / repair; transthoracic echocardiogram (2013;12/2014;01/2016); Paracentesis (2013-2014); Cardiac catheterization (12/2012); and left and right heart catheterization with coronary/graft angiogram (01/13/2013).  His family history includes Cancer in his sister; Parkinson's disease in his  brother.  Review of Systems  Constitutional: Negative for fever.  Musculoskeletal: Negative for joint pain and myalgias.  Skin: Negative for itching and rash.  Neurological: Negative for dizziness.    The problem list and medications were reviewed and updated by myself where necessary and exist elsewhere in the encounter.   OBJECTIVE:  BP 100/70 (BP Location: Right Arm, Patient Position: Sitting, Cuff Size: Normal)   Pulse 78   Temp 97.4 F (36.3 C) (Oral)   Resp 18   SpO2 100%   Physical Exam  Constitutional: He is oriented to person, place, and time.  Cardiovascular: Normal rate and regular rhythm.   Pulmonary/Chest: Effort normal and breath sounds normal.  Musculoskeletal: Normal range of motion.  Neurological: He is alert and oriented to person, place, and time.  Skin: Skin is warm and dry.     Psychiatric: He has a normal mood and affect.    No results found for this or any previous visit (from the past 72 hour(s)).  No results found.  ASSESSMENT AND PLAN  [name] was seen today for follow-up.  Diagnoses and all orders for this visit:  Skin abrasion: Resolving.  No sign of infection. The one serosanguinous lesion was redressed.  Will discharge the care of these lesions to his wife.     The patient is advised to call or return to clinic if he does not see an improvement in symptoms, or to seek the care of the closest emergency department if he worsens with the above plan.   Deliah BostonMichael Austyn Perriello, MHS, PA-C [facility] 09/13/2016 2:52 PM

## 2016-09-13 NOTE — Patient Instructions (Signed)
     IF you received an x-ray today, you will receive an invoice from [facility]. Please contact [facility] at [phone] with questions or concerns regarding your invoice.   IF you received labwork today, you will receive an invoice from [facility]. Please contact [facility] at [phone] with questions or concerns regarding your invoice.   Our billing staff will not be able to assist you with questions regarding bills from these companies.  You will be contacted with the lab results as soon as they are available. The fastest way to get your results is to activate your My Chart account. Instructions are located on the last page of this paperwork. If you have not heard from us regarding the results in 2 weeks, please contact this office.      

## 2016-09-17 DIAGNOSIS — I5022 Chronic systolic (congestive) heart failure: Secondary | ICD-10-CM | POA: Diagnosis not present

## 2016-09-17 DIAGNOSIS — M25522 Pain in left elbow: Secondary | ICD-10-CM | POA: Diagnosis not present

## 2016-09-17 DIAGNOSIS — E1165 Type 2 diabetes mellitus with hyperglycemia: Secondary | ICD-10-CM | POA: Diagnosis not present

## 2016-09-17 DIAGNOSIS — R188 Other ascites: Secondary | ICD-10-CM | POA: Diagnosis not present

## 2016-09-17 DIAGNOSIS — E784 Other hyperlipidemia: Secondary | ICD-10-CM | POA: Diagnosis not present

## 2016-09-17 DIAGNOSIS — M25521 Pain in right elbow: Secondary | ICD-10-CM | POA: Diagnosis not present

## 2016-09-20 DIAGNOSIS — K7460 Unspecified cirrhosis of liver: Secondary | ICD-10-CM | POA: Insufficient documentation

## 2016-09-20 DIAGNOSIS — R188 Other ascites: Secondary | ICD-10-CM

## 2016-09-20 HISTORY — DX: Unspecified cirrhosis of liver: K74.60

## 2016-09-20 HISTORY — DX: Other ascites: R18.8

## 2016-10-07 ENCOUNTER — Emergency Department (HOSPITAL_COMMUNITY): Payer: PPO

## 2016-10-07 ENCOUNTER — Inpatient Hospital Stay (HOSPITAL_COMMUNITY)
Admission: EM | Admit: 2016-10-07 | Discharge: 2016-10-17 | DRG: 689 | Disposition: A | Discharge status: Skilled Nursing Facility | Payer: PPO | Attending: Family Medicine | Admitting: Family Medicine

## 2016-10-07 ENCOUNTER — Encounter (HOSPITAL_COMMUNITY): Payer: Self-pay

## 2016-10-07 DIAGNOSIS — K7460 Unspecified cirrhosis of liver: Secondary | ICD-10-CM | POA: Diagnosis not present

## 2016-10-07 DIAGNOSIS — I2721 Secondary pulmonary arterial hypertension: Secondary | ICD-10-CM | POA: Diagnosis not present

## 2016-10-07 DIAGNOSIS — R319 Hematuria, unspecified: Secondary | ICD-10-CM

## 2016-10-07 DIAGNOSIS — Z794 Long term (current) use of insulin: Secondary | ICD-10-CM

## 2016-10-07 DIAGNOSIS — N390 Urinary tract infection, site not specified: Secondary | ICD-10-CM | POA: Diagnosis not present

## 2016-10-07 DIAGNOSIS — I130 Hypertensive heart and chronic kidney disease with heart failure and stage 1 through stage 4 chronic kidney disease, or unspecified chronic kidney disease: Secondary | ICD-10-CM | POA: Diagnosis present

## 2016-10-07 DIAGNOSIS — N3000 Acute cystitis without hematuria: Secondary | ICD-10-CM

## 2016-10-07 DIAGNOSIS — E118 Type 2 diabetes mellitus with unspecified complications: Secondary | ICD-10-CM | POA: Diagnosis present

## 2016-10-07 DIAGNOSIS — K7682 Hepatic encephalopathy: Secondary | ICD-10-CM

## 2016-10-07 DIAGNOSIS — I4891 Unspecified atrial fibrillation: Secondary | ICD-10-CM | POA: Diagnosis present

## 2016-10-07 DIAGNOSIS — I5030 Unspecified diastolic (congestive) heart failure: Secondary | ICD-10-CM

## 2016-10-07 DIAGNOSIS — E871 Hypo-osmolality and hyponatremia: Secondary | ICD-10-CM | POA: Diagnosis present

## 2016-10-07 DIAGNOSIS — N183 Chronic kidney disease, stage 3 (moderate): Secondary | ICD-10-CM | POA: Diagnosis not present

## 2016-10-07 DIAGNOSIS — E039 Hypothyroidism, unspecified: Secondary | ICD-10-CM | POA: Diagnosis not present

## 2016-10-07 DIAGNOSIS — R17 Unspecified jaundice: Secondary | ICD-10-CM | POA: Diagnosis not present

## 2016-10-07 DIAGNOSIS — I480 Paroxysmal atrial fibrillation: Secondary | ICD-10-CM | POA: Diagnosis not present

## 2016-10-07 DIAGNOSIS — I2510 Atherosclerotic heart disease of native coronary artery without angina pectoris: Secondary | ICD-10-CM | POA: Diagnosis present

## 2016-10-07 DIAGNOSIS — R404 Transient alteration of awareness: Secondary | ICD-10-CM | POA: Diagnosis not present

## 2016-10-07 DIAGNOSIS — I2781 Cor pulmonale (chronic): Secondary | ICD-10-CM | POA: Diagnosis not present

## 2016-10-07 DIAGNOSIS — I5022 Chronic systolic (congestive) heart failure: Secondary | ICD-10-CM | POA: Diagnosis not present

## 2016-10-07 DIAGNOSIS — Z951 Presence of aortocoronary bypass graft: Secondary | ICD-10-CM | POA: Diagnosis not present

## 2016-10-07 DIAGNOSIS — E876 Hypokalemia: Secondary | ICD-10-CM | POA: Diagnosis present

## 2016-10-07 DIAGNOSIS — Z87891 Personal history of nicotine dependence: Secondary | ICD-10-CM | POA: Diagnosis not present

## 2016-10-07 DIAGNOSIS — Z820 Family history of epilepsy and other diseases of the nervous system: Secondary | ICD-10-CM

## 2016-10-07 DIAGNOSIS — Z9114 Patient's other noncompliance with medication regimen: Secondary | ICD-10-CM

## 2016-10-07 DIAGNOSIS — K7290 Hepatic failure, unspecified without coma: Secondary | ICD-10-CM | POA: Diagnosis not present

## 2016-10-07 DIAGNOSIS — R4182 Altered mental status, unspecified: Secondary | ICD-10-CM | POA: Diagnosis not present

## 2016-10-07 DIAGNOSIS — E113599 Type 2 diabetes mellitus with proliferative diabetic retinopathy without macular edema, unspecified eye: Secondary | ICD-10-CM | POA: Diagnosis not present

## 2016-10-07 DIAGNOSIS — E7800 Pure hypercholesterolemia, unspecified: Secondary | ICD-10-CM | POA: Diagnosis not present

## 2016-10-07 DIAGNOSIS — G9341 Metabolic encephalopathy: Secondary | ICD-10-CM | POA: Diagnosis not present

## 2016-10-07 DIAGNOSIS — R188 Other ascites: Secondary | ICD-10-CM | POA: Diagnosis not present

## 2016-10-07 DIAGNOSIS — K8020 Calculus of gallbladder without cholecystitis without obstruction: Secondary | ICD-10-CM | POA: Diagnosis not present

## 2016-10-07 DIAGNOSIS — N179 Acute kidney failure, unspecified: Secondary | ICD-10-CM | POA: Diagnosis not present

## 2016-10-07 DIAGNOSIS — Z86711 Personal history of pulmonary embolism: Secondary | ICD-10-CM | POA: Diagnosis not present

## 2016-10-07 DIAGNOSIS — R531 Weakness: Secondary | ICD-10-CM | POA: Diagnosis not present

## 2016-10-07 DIAGNOSIS — E119 Type 2 diabetes mellitus without complications: Secondary | ICD-10-CM

## 2016-10-07 DIAGNOSIS — E1122 Type 2 diabetes mellitus with diabetic chronic kidney disease: Secondary | ICD-10-CM | POA: Diagnosis present

## 2016-10-07 DIAGNOSIS — B952 Enterococcus as the cause of diseases classified elsewhere: Secondary | ICD-10-CM | POA: Diagnosis present

## 2016-10-07 DIAGNOSIS — I509 Heart failure, unspecified: Secondary | ICD-10-CM

## 2016-10-07 DIAGNOSIS — W19XXXA Unspecified fall, initial encounter: Secondary | ICD-10-CM

## 2016-10-07 LAB — I-STAT TROPONIN, ED: Troponin i, poc: 0.06 ng/mL (ref 0.00–0.08)

## 2016-10-07 LAB — BASIC METABOLIC PANEL
Anion gap: 12 (ref 5–15)
BUN: 92 mg/dL — AB (ref 4–21)
BUN: 92 mg/dL — AB (ref 6–20)
CALCIUM: 9.4 mg/dL (ref 8.9–10.3)
CHLORIDE: 90 mmol/L — AB (ref 101–111)
CO2: 27 mmol/L (ref 22–32)
CREATININE: 1.7 mg/dL — AB (ref 0.6–1.3)
CREATININE: 1.67 mg/dL — AB (ref 0.61–1.24)
GFR calc non Af Amer: 38 mL/min — ABNORMAL LOW (ref >60)
GFR, EST AFRICAN AMERICAN: 44 mL/min — AB (ref >60)
GLUCOSE: 216 mg/dL — AB (ref 65–99)
Glucose: 216 mg/dL
Potassium: 3.3 mmol/L — AB (ref 3.4–5.3)
Potassium: 3.3 mmol/L — ABNORMAL LOW (ref 3.5–5.1)
Sodium: 129 mmol/L — AB (ref 137–147)
Sodium: 129 mmol/L — ABNORMAL LOW (ref 135–145)

## 2016-10-07 LAB — CBC
HCT: 37.5 % — ABNORMAL LOW (ref 39.0–52.0)
Hemoglobin: 13.7 g/dL (ref 13.0–17.0)
MCH: 31 pg (ref 26.0–34.0)
MCHC: 36.5 g/dL — AB (ref 30.0–36.0)
MCV: 84.8 fL (ref 78.0–100.0)
PLATELETS: 136 10*3/uL — AB (ref 150–400)
RBC: 4.42 MIL/uL (ref 4.22–5.81)
RDW: 15.7 % — ABNORMAL HIGH (ref 11.5–15.5)
WBC: 5.8 10*3/uL (ref 4.0–10.5)

## 2016-10-07 LAB — URINALYSIS, ROUTINE W REFLEX MICROSCOPIC
Bilirubin Urine: NEGATIVE
GLUCOSE, UA: NEGATIVE mg/dL
KETONES UR: NEGATIVE mg/dL
Nitrite: NEGATIVE
PH: 7 (ref 5.0–8.0)
PROTEIN: NEGATIVE mg/dL
SQUAMOUS EPITHELIAL / LPF: NONE SEEN
Specific Gravity, Urine: 1.008 (ref 1.005–1.030)

## 2016-10-07 LAB — HEPATIC FUNCTION PANEL
ALT: 26 U/L (ref 10–40)
ALT: 26 U/L (ref 17–63)
AST: 39 U/L (ref 14–40)
AST: 39 U/L (ref 15–41)
Albumin: 3.1 g/dL — ABNORMAL LOW (ref 3.5–5.0)
Alkaline Phosphatase: 160 U/L — AB (ref 25–125)
Alkaline Phosphatase: 160 U/L — ABNORMAL HIGH (ref 38–126)
BILIRUBIN, TOTAL: 5.4 mg/dL
Bilirubin, Direct: 3 mg/dL — ABNORMAL HIGH (ref 0.1–0.5)
Indirect Bilirubin: 2.4 mg/dL — ABNORMAL HIGH (ref 0.3–0.9)
Total Bilirubin: 5.4 mg/dL — ABNORMAL HIGH (ref 0.3–1.2)
Total Protein: 8.1 g/dL (ref 6.5–8.1)

## 2016-10-07 LAB — CBC AND DIFFERENTIAL
HEMATOCRIT: 38 % — AB (ref 41–53)
Hemoglobin: 13.7 g/dL (ref 13.5–17.5)
PLATELETS: 136 10*3/uL — AB (ref 150–399)
WBC: 5.8 10*3/mL

## 2016-10-07 LAB — I-STAT CG4 LACTIC ACID, ED: Lactic Acid, Venous: 1.29 mmol/L (ref 0.5–1.9)

## 2016-10-07 LAB — PROTIME-INR
INR: 0.96
Prothrombin Time: 12.7 seconds (ref 11.4–15.2)

## 2016-10-07 LAB — LIPASE, BLOOD: Lipase: 47 U/L (ref 11–51)

## 2016-10-07 LAB — AMMONIA: Ammonia: 175 umol/L — ABNORMAL HIGH (ref 9–35)

## 2016-10-07 LAB — GLUCOSE, CAPILLARY: Glucose-Capillary: 194 mg/dL — ABNORMAL HIGH (ref 65–99)

## 2016-10-07 MED ORDER — CEFTRIAXONE SODIUM 1 G IJ SOLR
1.0000 g | Freq: Once | INTRAMUSCULAR | Status: AC
  Administered 2016-10-07: 1 g via INTRAVENOUS
  Filled 2016-10-07: qty 10

## 2016-10-07 MED ORDER — SODIUM CHLORIDE 0.9 % IV SOLN
250.0000 mL | INTRAVENOUS | Status: DC | PRN
  Administered 2016-10-09: 250 mL via INTRAVENOUS

## 2016-10-07 MED ORDER — VITAMIN C 500 MG PO TABS
500.0000 mg | ORAL_TABLET | Freq: Every day | ORAL | Status: DC
  Administered 2016-10-08 – 2016-10-17 (×10): 500 mg via ORAL
  Filled 2016-10-07 (×10): qty 1

## 2016-10-07 MED ORDER — VITAMIN B-12 1000 MCG PO TABS
1000.0000 ug | ORAL_TABLET | Freq: Every day | ORAL | Status: DC
  Administered 2016-10-08 – 2016-10-17 (×10): 1000 ug via ORAL
  Filled 2016-10-07 (×10): qty 1

## 2016-10-07 MED ORDER — INSULIN ASPART 100 UNIT/ML ~~LOC~~ SOLN
0.0000 [IU] | Freq: Three times a day (TID) | SUBCUTANEOUS | Status: DC
  Administered 2016-10-08: 2 [IU] via SUBCUTANEOUS
  Administered 2016-10-08 (×2): 5 [IU] via SUBCUTANEOUS
  Administered 2016-10-09: 3 [IU] via SUBCUTANEOUS
  Administered 2016-10-09: 11 [IU] via SUBCUTANEOUS
  Administered 2016-10-09 – 2016-10-10 (×2): 5 [IU] via SUBCUTANEOUS
  Administered 2016-10-10: 3 [IU] via SUBCUTANEOUS
  Administered 2016-10-11 (×2): 5 [IU] via SUBCUTANEOUS
  Administered 2016-10-11: 3 [IU] via SUBCUTANEOUS
  Administered 2016-10-12 (×2): 5 [IU] via SUBCUTANEOUS
  Administered 2016-10-12: 3 [IU] via SUBCUTANEOUS
  Administered 2016-10-13 (×2): 2 [IU] via SUBCUTANEOUS
  Administered 2016-10-14: 8 [IU] via SUBCUTANEOUS
  Administered 2016-10-15: 2 [IU] via SUBCUTANEOUS
  Administered 2016-10-16: 3 [IU] via SUBCUTANEOUS
  Administered 2016-10-16: 2 [IU] via SUBCUTANEOUS

## 2016-10-07 MED ORDER — IOPAMIDOL (ISOVUE-300) INJECTION 61%
75.0000 mL | Freq: Once | INTRAVENOUS | Status: AC | PRN
  Administered 2016-10-07: 75 mL via INTRAVENOUS

## 2016-10-07 MED ORDER — IOPAMIDOL (ISOVUE-300) INJECTION 61%
INTRAVENOUS | Status: AC
  Filled 2016-10-07: qty 75

## 2016-10-07 MED ORDER — LACTULOSE 10 GM/15ML PO SOLN
20.0000 g | ORAL | Status: DC
  Administered 2016-10-07 – 2016-10-08 (×2): 20 g via ORAL
  Filled 2016-10-07 (×2): qty 30

## 2016-10-07 MED ORDER — FUROSEMIDE 40 MG PO TABS
80.0000 mg | ORAL_TABLET | Freq: Every day | ORAL | Status: DC
  Administered 2016-10-08 – 2016-10-09 (×2): 80 mg via ORAL
  Filled 2016-10-07 (×2): qty 2

## 2016-10-07 MED ORDER — SODIUM CHLORIDE 0.9 % IJ SOLN
INTRAMUSCULAR | Status: AC
  Filled 2016-10-07: qty 50

## 2016-10-07 MED ORDER — SODIUM CHLORIDE 0.9 % IV BOLUS (SEPSIS)
500.0000 mL | Freq: Once | INTRAVENOUS | Status: AC
  Administered 2016-10-07: 500 mL via INTRAVENOUS

## 2016-10-07 MED ORDER — CARVEDILOL 3.125 MG PO TABS
3.1250 mg | ORAL_TABLET | Freq: Two times a day (BID) | ORAL | Status: DC
  Administered 2016-10-08 – 2016-10-17 (×19): 3.125 mg via ORAL
  Filled 2016-10-07 (×19): qty 1

## 2016-10-07 MED ORDER — SPIRONOLACTONE 25 MG PO TABS
25.0000 mg | ORAL_TABLET | Freq: Every day | ORAL | Status: DC
  Administered 2016-10-07 – 2016-10-17 (×11): 25 mg via ORAL
  Filled 2016-10-07 (×11): qty 1

## 2016-10-07 MED ORDER — LACTULOSE 10 GM/15ML PO SOLN: 20.0000 g | Freq: Every day | ORAL | Status: DC | PRN

## 2016-10-07 MED ORDER — SODIUM CHLORIDE 0.9% FLUSH
3.0000 mL | INTRAVENOUS | Status: DC | PRN
  Administered 2016-10-11: 3 mL via INTRAVENOUS
  Filled 2016-10-07: qty 3

## 2016-10-07 MED ORDER — HEPARIN SODIUM (PORCINE) 5000 UNIT/ML IJ SOLN
5000.0000 [IU] | Freq: Three times a day (TID) | INTRAMUSCULAR | Status: DC
  Filled 2016-10-07 (×13): qty 1

## 2016-10-07 MED ORDER — INSULIN ASPART 100 UNIT/ML ~~LOC~~ SOLN
0.0000 [IU] | Freq: Every day | SUBCUTANEOUS | Status: DC
  Administered 2016-10-09 – 2016-10-10 (×2): 2 [IU] via SUBCUTANEOUS
  Administered 2016-10-12: 0 [IU] via SUBCUTANEOUS
  Administered 2016-10-14: 2 [IU] via SUBCUTANEOUS
  Administered 2016-10-15: 0 [IU] via SUBCUTANEOUS
  Administered 2016-10-16: 2 [IU] via SUBCUTANEOUS

## 2016-10-07 MED ORDER — DEXTROSE 5 % IV SOLN
1.0000 g | INTRAVENOUS | Status: DC
  Administered 2016-10-08 – 2016-10-09 (×2): 1 g via INTRAVENOUS
  Filled 2016-10-07 (×4): qty 10

## 2016-10-07 MED ORDER — SODIUM CHLORIDE 0.9% FLUSH
3.0000 mL | Freq: Two times a day (BID) | INTRAVENOUS | Status: DC
  Administered 2016-10-07 – 2016-10-13 (×11): 3 mL via INTRAVENOUS

## 2016-10-07 NOTE — ED Notes (Signed)
WILL TRANSPORT PT TO 3W 1332-1. AAOX4. PT IN NO APPARENT DISTRESS OR PAIN. THE OPPORTUNITY TO ASK QUESTIONS WAS PROVIDED.

## 2016-10-07 NOTE — ED Triage Notes (Addendum)
PT RECEIVED FROM HOME VIA EMS FOR INCREASED WEAKNESS X2 WEEKS. PER EMS, THE WIFE STS HE FELL 5 DAYS AGO, EMS CAME OUT, BUT THE PT WAS NOT TAKEN TO THE HOSPITAL. TODAY THE WIFE STS MORE WEAKNESS. DENIES CHEST PAIN, N/V/D, OR FEVER.

## 2016-10-07 NOTE — ED Notes (Signed)
Bed: WA04 Expected date:  Expected time:  Means of arrival:  Comments: Pt still in room

## 2016-10-07 NOTE — H&P (Signed)
History and Physical    [name] MLY:[id number] DOB: 1941-06-08 DOA: 10/07/2016  PCP: Tammi Sou, MD  Patient coming from: home  Chief Complaint: confusion  HPI: [name] is a 75 y.o. male with medical history significant of CHF presumed diastolic, Afib presenting with 5 days of confusion. Pt had fall around that time. Wife reports that his confusion has persisted and is getting worse. It has been gradual. Also associate with foul smelling urine. Nothing she is aware of makes it better or worse. History is obtained from wife since patient is confused and is unable to provide history  ED Course: normal wbc and afebrile. Urinalysis + for UTI, Pt also had elevated alk phos and ammonia level.  Review of Systems: unable to accurately assess due to confusion  Past Medical History:  Diagnosis Date  . Atrial fibrillation ([place])    ?GI bleed in past, plus pt tends to "self adjust/manage" his meds w/out regard for MD's guidance, so he has NOT been on anticoagulant for this.  . CHF (congestive heart failure) ([place])   . Chronic bronchitis ([place])    "used to get it often; haven't had it for awhile now" (04/12/2013)  . Chronic renal insufficiency, stage III (moderate)    GFR @ 50  . Chronic systolic heart failure ([place])   . Coronary artery disease   . Gout   . Heart murmur   . High cholesterol   . Hypertension   . Pneumonia   . Positive TB test    "but I don't have it"  . Postpolio syndrome 1951   "affected the right side of my body; been weak on that side ever since" (04/12/2013)  . 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 Katy Fitch)  . Pulmonary embolism ([place]) 1980's or 1990's  . Severe mitral regurgitation 2013  . Severe tricuspid regurgitation 2013  . Subclinical hypothyroidism   . Type II diabetes mellitus ([place])   . Umbilical hernia 05/1274   Symptomatic--Dr. Georgette Dover to repair if cleared by cardiologist    Past Surgical  History:  Procedure Laterality Date  . APPENDECTOMY  ~ 1962  . CARDIAC CATHETERIZATION     "I've had a few since 1989" (04/12/2013)  . CARDIAC CATHETERIZATION  12/2012   Severe native CAD, patent bypass grafts x 3, EF 25-30%--med mgmt  . CATARACT EXTRACTION W/ INTRAOCULAR LENS  IMPLANT, BILATERAL Bilateral 2005  . CERVICAL DISCECTOMY  1984   C6-7  . CORONARY ARTERY BYPASS GRAFT  01/1988; 05/1999   CABG X 3; CABG X 2  . EXPLORATORY LAPAROTOMY  ~ 1962   "took out my appendix while there; bowel was inflammed" (04/12/2013)  . HYDROCELE EXCISION / REPAIR     "couple times" (04/12/2013)  . LEFT AND RIGHT HEART CATHETERIZATION WITH CORONARY/GRAFT ANGIOGRAM  01/13/2013   Procedure: LEFT AND RIGHT HEART CATHETERIZATION WITH Beatrix Fetters;  Surgeon: Birdie Riddle, MD;  Location: [facility] C[facility];  Service: Cardiovascular;;  . PARACENTESIS  2013-2014   Multiple times, usually pulling off 6-7 liters of fluid  . RETINAL LASER PROCEDURE Bilateral    "more than once on the left" (04/12/2013)  . TRANSTHORACIC ECHOCARDIOGRAM  2013;12/2014;01/2016   LV dilation, EF 30-35%, diffuse hypokinesis, grade I diast dysfxn, severe mitral and tricuspid regurg, LAE and RAE--biventricular dysfunction moderate/severe. 01/2016 dilated LV with akinesis/hypokinesis diffusely, severe RV dysfxn, severe mitral regurg, severe PA HTN     reports that he quit smoking about 42 years ago. His  smoking use included Cigarettes. He has a 5.00 pack-year smoking history. He quit smokeless tobacco use about 46 years ago. His smokeless tobacco use included Chew. He reports that he does not drink alcohol or use drugs.  Allergies  Allergen Reactions  . Adhesive [Tape] Other (See Comments)    Reaction:  Pulls pts skin off   . Sulfa Antibiotics Rash    Family History  Problem Relation Age of Onset  . Cancer Sister   . Parkinson's disease Brother     Prior to Admission medications   Medication Sig Start Date End Date Taking?  Authorizing Provider  calcium carbonate (OSCAL) 1500 (600 Ca) MG TABS tablet Take 600 mg of elemental calcium by mouth daily.   Yes Historical Provider, MD  carvedilol (COREG) 3.125 MG tablet Take 1 tablet (3.125 mg total) by mouth 2 (two) times daily with a meal. 08/13/12  Yes Dixie Dials, MD  cholecalciferol (VITAMIN D) 1000 units tablet Take 1,000 Units by mouth daily.   Yes Historical Provider, MD  furosemide (LASIX) 80 MG tablet Take 80 mg by mouth 3 (three) times daily.   Yes Historical Provider, MD  insulin NPH-regular Human (NOVOLIN 70/30) (70-30) 100 UNIT/ML injection Inject 0-30 Units into the skin 2 (two) times daily as needed (for high blood sugar). Pt uses as needed per sliding scale.   Yes Historical Provider, MD  magnesium oxide (MAG-OX) 400 (241.3 Mg) MG tablet Take 400 mg by mouth daily.   Yes Historical Provider, MD  metolazone (ZAROXOLYN) 5 MG tablet Take 5 mg by mouth daily.   Yes Historical Provider, MD  potassium chloride SA (K-DUR,KLOR-CON) 20 MEQ tablet Take 20 mEq by mouth 3 (three) times daily.   Yes Historical Provider, MD  spironolactone (ALDACTONE) 25 MG tablet Take 25 mg by mouth daily.   Yes Historical Provider, MD  vitamin B-12 (CYANOCOBALAMIN) 1000 MCG tablet Take 1,000 mcg by mouth daily.   Yes Historical Provider, MD  vitamin C (ASCORBIC ACID) 500 MG tablet Take 500 mg by mouth daily.   Yes Historical Provider, MD    Physical Exam: Vitals:   10/07/16 1407 10/07/16 1413 10/07/16 1530 10/07/16 1700  BP:   110/78 110/67  Pulse:   70 (!) 54  Resp:   20 16  Temp:  97 F (36.1 C)    TempSrc:  Rectal    SpO2: 95%  98% 100%  Weight:  61.2 kg (135 lb)    Height:  _0  (1.651 m)      Constitutional: NAD, calm, comfortable Vitals:   10/07/16 1407 10/07/16 1413 10/07/16 1530 10/07/16 1700  BP:   110/78 110/67  Pulse:   70 (!) 54  Resp:   20 16  Temp:  97 F (36.1 C)    TempSrc:  Rectal    SpO2: 95%  98% 100%  Weight:  61.2 kg (135 lb)    Height:  _1   (1.651 m)     Eyes: PERRL, lids and conjunctivae normal ENMT: Mucous membranes are dry. Posterior pharynx clear of any exudate or lesions. Neck: normal, supple, no masses, no thyromegaly Respiratory: clear to auscultation bilaterally, no wheezing, no crackles. Normal respiratory effort. No accessory muscle use.  Cardiovascular: irregularly irregular, no rubs  Abdomen: distended, non tender, + bowel sounds Musculoskeletal: no clubbing / cyanosis.  Skin: no rashes, lesions, ulcers. No induration, on limited exam. Neurologic: no facial asymmetry, moves extremities equally. Difficult exam due to confusion Psychiatric: unable to accurately assess due to confusion.  Labs on Admission: I have personally reviewed following labs and imaging studies  CBC:  Recent Labs Lab 10/07/16 1429  WBC 5.8  HGB 13.7  HCT 37.5*  MCV 84.8  PLT 616*   Basic Metabolic Panel:  Recent Labs Lab 10/07/16 1429  NA 129*  K 3.3*  CL 90*  CO2 27  GLUCOSE 216*  BUN 92*  CREATININE 1.67*  CALCIUM 9.4   GFR: Estimated Creatinine Clearance: 32.6 mL/min (by C-G formula based on SCr of 1.67 mg/dL (H)). Liver Function Tests:  Recent Labs Lab 10/07/16 1429  AST 39  ALT 26  ALKPHOS 160*  BILITOT 5.4*  PROT 8.1  ALBUMIN 3.1*    Recent Labs Lab 10/07/16 1429  LIPASE 47    Recent Labs Lab 10/07/16 1521  AMMONIA 175*   Coagulation Profile:  Recent Labs Lab 10/07/16 1429  INR 0.96   Cardiac Enzymes: No results for input(s): CKTOTAL, CKMB, CKMBINDEX, TROPONINI in the last 168 hours. BNP (last 3 results) No results for input(s): PROBNP in the last 8760 hours. HbA1C: No results for input(s): HGBA1C in the last 72 hours. CBG: No results for input(s): GLUCAP in the last 168 hours. Lipid Profile: No results for input(s): CHOL, HDL, LDLCALC, TRIG, CHOLHDL, LDLDIRECT in the last 72 hours. Thyroid Function Tests: No results for input(s): TSH, T4TOTAL, FREET4, T3FREE, THYROIDAB in the  last 72 hours. Anemia Panel: No results for input(s): VITAMINB12, FOLATE, FERRITIN, TIBC, IRON, RETICCTPCT in the last 72 hours. Urine analysis:    Component Value Date/Time   COLORURINE YELLOW 10/07/2016 1520   APPEARANCEUR TURBID (A) 10/07/2016 1520   LABSPEC 1.008 10/07/2016 1520   PHURINE 7.0 10/07/2016 1520   GLUCOSEU NEGATIVE 10/07/2016 1520   HGBUR SMALL (A) 10/07/2016 1520   BILIRUBINUR NEGATIVE 10/07/2016 1520   BILIRUBINUR neg 02/21/2015 1456   KETONESUR NEGATIVE 10/07/2016 1520   PROTEINUR NEGATIVE 10/07/2016 1520   UROBILINOGEN 0.2 02/21/2015 1456   NITRITE NEGATIVE 10/07/2016 1520   LEUKOCYTESUR LARGE (A) 10/07/2016 1520   Sepsis Labs: !!!!!!!!!!!!!!!!!!!!!!!!!!!!!!!!!!!!!!!!!!!! _0 (procalcitonin:4,lacticidven:4) )No results found for this or any previous visit (from the past 240 hour(s)).   Radiological Exams on Admission: Dg Chest 2 View  Result Date: 10/07/2016 CLINICAL DATA:  Weakness, recent falls EXAM: CHEST  2 VIEW COMPARISON:  03/27/2016 FINDINGS: Cardiac shadow is enlarged. Postsurgical changes are again seen. The lungs are well aerated bilaterally with mild central vascular congestion. No focal infiltrate or sizable effusion is seen. No bony abnormality is noted. IMPRESSION: Mild vascular congestion. Electronically Signed   By: Inez Catalina M.D.   On: 10/07/2016 15:53    EKG: Independently reviewed. Atrial fibrillation rate controlled. With no ST elevations  Assessment/Plan Active Problems:   Metabolic encephalopathy -  Multifactorial, most likely due to uti and elevated ammonia level - Will administer lactulose and place on IV antibiotics - Also will obtain hepatitis liver panel.  Elevated Alk phosphatase - f/u with CT scan of abdomen ordered in the ER    CHF (congestive heart failure) ([place]) - compensated. Will continue B blocker and lasix    Atrial fibrillation (HCC) - Will continue B blocker - recently fell I suspect he is not on  anticoagulation due to confusion and increased risk of falls    Type 2 diabetes with complication (HCC) - diabetic diet, SSI    UTI (urinary tract infection) - Place on rocephin - await urine culture results    DVT prophylaxis: heparin Code Status: full Family Communication: d/c wife at bedside Disposition  Plan: pending improvement in condition, med surg Consults called: none Admission status:  observation   Velvet Bathe MD [facility] Pager [phone]  If 7PM-7AM, please contact night-coverage [email or web address] Password [facility]  10/07/2016, 6:29 PM

## 2016-10-07 NOTE — Progress Notes (Signed)
EDCM spoke to patient and his wife at bedside.  Patient lives at home with his wife.  Patient does not have home health services currently.  He reports he has had home health services in the past but he cannot remember the agency.  Patient's wife reports the patient does not have any dme.  Patient reports he needs assistance with his ADL's at home.  EDCM provided patient's [place], [place] services.  Patient is unsure whether or not he would like home health services at this time.  No further EDCM needs at this time.

## 2016-10-07 NOTE — ED Provider Notes (Signed)
[facility]-EMERGENCY DEPT Provider Note   CSN: [id number] Arrival date & time: 10/07/16  1340     History   Chief Complaint Chief Complaint  Patient presents with  . Weakness    HPI [name] is a 75 y.o. male.  HPI   75 year old male presents today with complaints of weakness. Patient's history is provided by his wife who is at bedside. She has a history congestive heart failure for which she was admitted in 2013. He is a patient of Dr. Shawnie Pons. She notes that over the last several weeks he had increasing fatigue. She notes dramatic worsening with onset of confusion starting 4 days ago. She reports that normally uses a urinal while on the couch, but is no longer able to use that. She reports that he has been unable to ambulate on his own over the last several days. She notes that he sometimes moans, but when asking if he is having pain he denies. She denies any infectious etiology other than dark and malodorous urine. Reports he has a dry nonproductive cough from time to time, has not had a fever at home. She denies any lower extremity swelling or edema, denies any abdominal distention. She denies any focal neurological deficits. She does note that he has had numerous falls recently with abrasion to his shin with the most recent fall. Patient reports that he feels like his normal self, is at his baseline and denies any pain or infectious etiology.    Past Medical History:  Diagnosis Date  . Atrial fibrillation (HCC)    ?GI bleed in past, plus pt tends to "self adjust/manage" his meds w/out regard for MD's guidance, so he has NOT been on anticoagulant for this.  . CHF (congestive heart failure) (HCC)   . Chronic bronchitis (HCC)    "used to get it often; haven't had it for awhile now" (04/12/2013)  . Chronic renal insufficiency, stage III (moderate)    GFR @ 50  . Chronic systolic heart failure (HCC)   . Coronary artery disease   . Gout   . Heart murmur   . High cholesterol   .  Hypertension   . Pneumonia   . Positive TB test    "but I don't have it"  . Postpolio syndrome 1951   "affected the right side of my body; been weak on that side ever since" (04/12/2013)  . 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 Dione Booze)  . Pulmonary embolism (HCC) 1980's or 1990's  . Severe mitral regurgitation 2013  . Severe tricuspid regurgitation 2013  . Subclinical hypothyroidism   . Type II diabetes mellitus (HCC)   . Umbilical hernia 02/2015   Symptomatic--Dr. Corliss Skains to repair if cleared by cardiologist    Patient Active Problem List   Diagnosis Date Noted  . Metabolic encephalopathy 10/07/2016  . UTI (urinary tract infection) 10/07/2016  . Ascites 03/27/2016  . 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 Shortness of breath 02/10/2015  . 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 Chronic renal insufficiency, stage III (moderate) 01/28/2015  . Type 2 diabetes with complication (HCC) 01/28/2015  . Acute on chronic left systolic heart failure (HCC) 12/26/2014  . Rash and nonspecific skin eruption 06/20/2014  . Skin lesion of back 06/20/2014  . Preventative health care 06/20/2014  . Right medial knee pain 05/24/2014  . Right ankle sprain 05/24/2014  . Atrial fibrillation (HCC) 05/24/2014  . Hyperlipidemia 05/24/2014  . CHF (  congestive heart failure) (HCC) 10/31/2011    Class: Chronic  . Other ascites 10/31/2011    Class: Acute  . DM II (diabetes mellitus, type II), controlled (HCC) 10/31/2011    Class: Chronic  . 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 APPENDECTOMY  ~ 1962  . CARDIAC CATHETERIZATION     "I've had a few since 1989" (04/12/2013)  . CARDIAC CATHETERIZATION  12/2012   Severe native CAD, patent bypass grafts x 3, EF 25-30%--med mgmt  . CATARACT EXTRACTION W/ INTRAOCULAR LENS   IMPLANT, BILATERAL Bilateral 2005  . CERVICAL DISCECTOMY  1984   C6-7  . CORONARY ARTERY BYPASS GRAFT  01/1988; 05/1999   CABG X 3; CABG X 2  . EXPLORATORY LAPAROTOMY  ~ 1962   "took out my appendix while there; bowel was inflammed" (04/12/2013)  . HYDROCELE EXCISION / REPAIR     "couple times" (04/12/2013)  . LEFT AND RIGHT HEART CATHETERIZATION WITH CORONARY/GRAFT ANGIOGRAM  01/13/2013   Procedure: LEFT AND RIGHT HEART CATHETERIZATION WITH Isabel CapriceORONARY/GRAFT ANGIOGRAM;  Surgeon: Ricki RodriguezAjay S [name], MD;  Location: [facility] CATH [facility];  Service: Cardiovascular;;  . PARACENTESIS  2013-2014   Multiple times, usually pulling off 6-7 liters of fluid  . RETINAL LASER PROCEDURE Bilateral    "more than once on the left" (04/12/2013)  . TRANSTHORACIC ECHOCARDIOGRAM  2013;12/2014;01/2016   LV dilation, EF 30-35%, diffuse hypokinesis, grade I diast dysfxn, severe mitral and tricuspid regurg, LAE and RAE--biventricular dysfunction moderate/severe. 01/2016 dilated LV with akinesis/hypokinesis diffusely, severe RV dysfxn, severe mitral regurg, severe PA HTN       Home Medications    Prior to Admission medications   Medication Sig Start Date End Date Taking? Authorizing Provider  calcium carbonate (OSCAL) 1500 (600 Ca) MG TABS tablet Take 600 mg of elemental calcium by mouth daily.   Yes Historical Provider, MD  carvedilol (COREG) 3.125 MG tablet Take 1 tablet (3.125 mg total) by mouth 2 (two) times daily with a meal. 08/13/12  Yes Orpah CobbAjay [name], MD  cholecalciferol (VITAMIN D) 1000 units tablet Take 1,000 Units by mouth daily.   Yes Historical Provider, MD  furosemide (LASIX) 80 MG tablet Take 80 mg by mouth 3 (three) times daily.   Yes Historical Provider, MD  insulin NPH-regular Human (NOVOLIN 70/30) (70-30) 100 UNIT/ML injection Inject 0-30 Units into the skin 2 (two) times daily as needed (for high blood sugar). Pt uses as needed per sliding scale.   Yes Historical Provider, MD  magnesium oxide (MAG-OX) 400 (241.3 Mg)  MG tablet Take 400 mg by mouth daily.   Yes Historical Provider, MD  metolazone (ZAROXOLYN) 5 MG tablet Take 5 mg by mouth daily.   Yes Historical Provider, MD  potassium chloride SA (K-DUR,KLOR-CON) 20 MEQ tablet Take 20 mEq by mouth 3 (three) times daily.   Yes Historical Provider, MD  spironolactone (ALDACTONE) 25 MG tablet Take 25 mg by mouth daily.   Yes Historical Provider, MD  vitamin B-12 (CYANOCOBALAMIN) 1000 MCG tablet Take 1,000 mcg by mouth daily.   Yes Historical Provider, MD  vitamin C (ASCORBIC ACID) 500 MG tablet Take 500 mg by mouth daily.   Yes Historical Provider, MD    Family History Family History  Problem Relation Age of Onset  . Cancer Sister   . Parkinson's disease Brother     Social History Social History  Substance Use Topics  . Smoking status: Former Smoker  Packs/day: 0.25    Years: 20.00    Types: Cigarettes    Quit date: 10/30/1973  . Smokeless tobacco: Former NeurosurgeonUser    Types: Chew    Quit date: 10/21/1969  . Alcohol use No     Comment: 04/12/2013 "have ~ 2 drinks/yr; a drink for me is ~ 0.5oz"     Allergies   Adhesive [tape] and Sulfa antibiotics   Review of Systems Review of Systems  All other systems reviewed and are negative.   Physical Exam Updated Vital Signs BP 111/64 (BP Location: Right Arm)   Pulse (!) 50   Temp (!) 94 F (34.4 C) (Axillary)   Resp 16   Ht 5\' 5"  (1.651 m)   Wt 61.2 kg   SpO2 100%   BMI 22.47 kg/m   Physical Exam  Constitutional: He is oriented to person, place, and time. He appears well-developed and well-nourished.  HENT:  Head: Normocephalic and atraumatic.  Eyes: Conjunctivae are normal. Pupils are equal, round, and reactive to light. Right eye exhibits no discharge. Left eye exhibits no discharge. No scleral icterus.  Neck: Normal range of motion. No JVD present. No tracheal deviation present.  Cardiovascular:  Irregularly irregular rhythm  Pulmonary/Chest: Effort normal. No stridor.  Poor  inspiratory effort- very faint crackles noted lower lobe left  Abdominal: Soft. There is no tenderness.  Umbilical hernia  Neurological: He is alert and oriented to person, place, and time. Coordination normal.  Skin: Skin is warm.  Superficial abrasion right shin- no significant surrounding cellultis  No edema to LE's  Psychiatric: He has a normal mood and affect. His behavior is normal. Judgment and thought content normal.  Nursing note and vitals reviewed.    ED Treatments / Results  Labs (all labs ordered are listed, but only abnormal results are displayed) Labs Reviewed  BASIC METABOLIC PANEL - Abnormal; Notable for the following:       Result Value   Sodium 129 (*)    Potassium 3.3 (*)    Chloride 90 (*)    Glucose, Bld 216 (*)    BUN 92 (*)    Creatinine, Ser 1.67 (*)    GFR calc non Af Amer 38 (*)    GFR calc Af Amer 44 (*)    All other components within normal limits  CBC - Abnormal; Notable for the following:    HCT 37.5 (*)    MCHC 36.5 (*)    RDW 15.7 (*)    Platelets 136 (*)    All other components within normal limits  URINALYSIS, ROUTINE W REFLEX MICROSCOPIC - Abnormal; Notable for the following:    APPearance TURBID (*)    Hgb urine dipstick SMALL (*)    Leukocytes, UA LARGE (*)    Bacteria, UA RARE (*)    All other components within normal limits  HEPATIC FUNCTION PANEL - Abnormal; Notable for the following:    Albumin 3.1 (*)    Alkaline Phosphatase 160 (*)    Total Bilirubin 5.4 (*)    Bilirubin, Direct 3.0 (*)    Indirect Bilirubin 2.4 (*)    All other components within normal limits  AMMONIA - Abnormal; Notable for the following:    Ammonia 175 (*)    All other components within normal limits  CULTURE, BLOOD (ROUTINE X 2)  CULTURE, BLOOD (ROUTINE X 2)  URINE CULTURE  PROTIME-INR  LIPASE, BLOOD  COMPREHENSIVE METABOLIC PANEL  CBC  HEPATITIS PANEL, ACUTE  CBC  CREATININE, SERUM  Rosezena Sensor, ED  I-STAT CG4 LACTIC ACID, ED     EKG  EKG Interpretation  Date/Time:  Monday October 07 2016 14:11:32 EST Ventricular Rate:  54 PR Interval:    QRS Duration: 133 QT Interval:  499 QTC Calculation: 473 R Axis:   102 Text Interpretation:  Atrial fibrillation Ventricular premature complex Consider left ventricular hypertrophy Repol abnrm suggests ischemia, diffuse leads No significant change since last tracing Confirmed by KNAPP  MD-J, JON ([id number]) on 10/07/2016 2:56:45 PM       Radiology Dg Chest 2 View  Result Date: 10/07/2016 CLINICAL DATA:  Weakness, recent falls EXAM: CHEST  2 VIEW COMPARISON:  03/27/2016 FINDINGS: Cardiac shadow is enlarged. Postsurgical changes are again seen. The lungs are well aerated bilaterally with mild central vascular congestion. No focal infiltrate or sizable effusion is seen. No bony abnormality is noted. IMPRESSION: Mild vascular congestion. Electronically Signed   By: Alcide Clever M.D.   On: 10/07/2016 15:53   Ct Abdomen Pelvis W Contrast  Result Date: 10/07/2016 CLINICAL DATA:  Status post fall 5 days ago. Increasing weakness over the past 2 weeks. EXAM: CT ABDOMEN AND PELVIS WITH CONTRAST TECHNIQUE: Multidetector CT imaging of the abdomen and pelvis was performed using the standard protocol following bolus administration of intravenous contrast. CONTRAST:  75 ml ISOVUE-300 IOPAMIDOL (ISOVUE-300) INJECTION 61% COMPARISON:  None. FINDINGS: Lower chest: Trace right pleural effusion is noted. No left pleural effusion or pericardial effusion. Calcific coronary artery disease is seen. Gynecomastia is identified. Mild dependent atelectasis is seen in the right base. Hepatobiliary: The liver is shrunken with a nodular border consistent with cirrhosis. The portal vein is patent with laminar flow on early images seen in the SMV and portal veins. No focal liver lesion. Several small gallstones are identified. Pancreas: Unremarkable. No pancreatic ductal dilatation or surrounding inflammatory  changes. Spleen: Normal in size without focal abnormality. Adrenals/Urinary Tract: Small low attenuating lesions in the right kidney cannot be definitively characterized but are likely cysts. Scarring is noted in the upper pole of the right kidney. The adrenal glands appear normal. Urinary bladder is distended but otherwise unremarkable. Stomach/Bowel: Colonic diverticula without diverticulitis noted the stomach and small bowel appear normal. Vascular/Lymphatic: Aortoiliac atherosclerosis without aneurysm is identified. No lymphadenopathy. Reproductive: Negative.  Penile prosthesis noted. Other: Small volume of abdominal ascites is seen.  No hernia. Musculoskeletal: Scoliosis and multilevel spondylosis are identified. Multiple Schmorl's nodes are seen. No worrisome lesion is identified. IMPRESSION: Cirrhotic liver with associated small volume of abdominal ascites. Trace right pleural effusion. Cardiomegaly. Calcific aortic and coronary atherosclerosis. Gallstones without cholecystitis. Diverticulosis without diverticulitis. Gynecomastia. Electronically Signed   By: Drusilla Kanner M.D.   On: 10/07/2016 19:00    Procedures Procedures (including critical care time)  Medications Ordered in ED Medications  iopamidol (ISOVUE-300) 61 % injection (not administered)  sodium chloride 0.9 % injection (not administered)  spironolactone (ALDACTONE) tablet 25 mg (not administered)  vitamin B-12 (CYANOCOBALAMIN) tablet 1,000 mcg (not administered)  vitamin C (ASCORBIC ACID) tablet 500 mg (not administered)  furosemide (LASIX) tablet 80 mg (not administered)  carvedilol (COREG) tablet 3.125 mg (not administered)  sodium chloride flush (NS) 0.9 % injection 3 mL (not administered)  sodium chloride flush (NS) 0.9 % injection 3 mL (not administered)  0.9 %  sodium chloride infusion (not administered)  heparin injection 5,000 Units (not administered)  insulin aspart (novoLOG) injection 0-15 Units (not administered)   insulin aspart (novoLOG) injection 0-5 Units (not administered)  cefTRIAXone (ROCEPHIN) 1 g  in dextrose 5 % 50 mL IVPB (not administered)  lactulose (CHRONULAC) 10 GM/15ML solution 20 g (not administered)  sodium chloride 0.9 % bolus 500 mL (0 mLs Intravenous Stopped 10/07/16 1838)  cefTRIAXone (ROCEPHIN) 1 g in dextrose 5 % 50 mL IVPB (0 g Intravenous Stopped 10/07/16 1947)  iopamidol (ISOVUE-300) 61 % injection 75 mL (75 mLs Intravenous Contrast Given 10/07/16 1802)     Initial Impression / Assessment and Plan / ED Course  I have reviewed the triage vital signs and the nursing notes.  Pertinent labs & imaging results that were available during my care of the patient were reviewed by me and considered in my medical decision making (see chart for details).  Clinical Course      Final Clinical Impressions(s) / ED Diagnoses   Final diagnoses:  Elevated bilirubin  Urinary tract infection with hematuria, site unspecified  Hepatic encephalopathy (HCC)    Labs: I-STAT troponin, BMP, CBC  Imaging: DG chest 2 view  Consults:  Therapeutics: Ceftriaxone, normal saline  Discharge Meds:   Assessment/Plan:  75 year old male presents today with jaundice, confusion, and fatigue. Patient's workup significant for elevated bilirubin, elevated alkaline phosphatase, elevated  Ammonia,  and urinary tract infection. Question hepatic encephalopathy with elevated with urinary tract infection. Patient has no history of hepatic dysfunction, denies any abdominal pain. CT scan will be ordered, hospitalist consult for admission. Low suspicion for significant intra-abdominal infection in this patient. Hospitalist services agreed for hospital admission.   New Prescriptions Current Discharge Medication List       Eyvonne Mechanic, PA-C 10/07/16 2047    Linwood Dibbles, MD 10/08/16 [phone]

## 2016-10-07 NOTE — Progress Notes (Signed)
Pharmacy Antibiotic Note  [name] is a 75 y.o. male admitted on 10/07/2016 with UTI.  Pharmacy has been consulted for Ceftriaxone dosing.  Plan: Ceftriaxone 1g IV q24h Need for further dosage adjustment appears unlikely at present.    Will sign off at this time.  Please reconsult if a change in clinical status warrants re-evaluation of dosage.   Height: 5\' 5"  (165.1 cm) Weight: 135 lb (61.2 kg) IBW/kg (Calculated) : 61.5  Temp (24hrs), Avg:97 F (36.1 C), Min:97 F (36.1 C), Max:97 F (36.1 C)   Recent Labs Lab 10/07/16 1429 10/07/16 1717  WBC 5.8  --   CREATININE 1.67*  --   LATICACIDVEN  --  1.29    Estimated Creatinine Clearance: 32.6 mL/min (by C-G formula based on SCr of 1.67 mg/dL (H)).    Allergies  Allergen Reactions  . Adhesive [Tape] Other (See Comments)    Reaction:  Pulls pts skin off   . Sulfa Antibiotics Rash    Antimicrobials this admission: 12/18 Ceftriaxone >>  Dose adjustments this admission:  Microbiology results: 12/18 BCx: collected 12/18 UCx: ordered   Thank you for allowing pharmacy to be a part of this patient's care.  Haynes Hoehnolleen Courage Biglow, PharmD, BCPS 10/07/2016, 6:50 PM  Pager: [phone]

## 2016-10-08 DIAGNOSIS — I2510 Atherosclerotic heart disease of native coronary artery without angina pectoris: Secondary | ICD-10-CM | POA: Diagnosis not present

## 2016-10-08 DIAGNOSIS — Z9114 Patient's other noncompliance with medication regimen: Secondary | ICD-10-CM | POA: Diagnosis not present

## 2016-10-08 DIAGNOSIS — N183 Chronic kidney disease, stage 3 (moderate): Secondary | ICD-10-CM | POA: Diagnosis not present

## 2016-10-08 DIAGNOSIS — G9341 Metabolic encephalopathy: Secondary | ICD-10-CM | POA: Diagnosis not present

## 2016-10-08 DIAGNOSIS — E1122 Type 2 diabetes mellitus with diabetic chronic kidney disease: Secondary | ICD-10-CM | POA: Diagnosis not present

## 2016-10-08 DIAGNOSIS — R2681 Unsteadiness on feet: Secondary | ICD-10-CM | POA: Diagnosis not present

## 2016-10-08 DIAGNOSIS — M6281 Muscle weakness (generalized): Secondary | ICD-10-CM | POA: Diagnosis not present

## 2016-10-08 DIAGNOSIS — I5030 Unspecified diastolic (congestive) heart failure: Secondary | ICD-10-CM | POA: Diagnosis not present

## 2016-10-08 DIAGNOSIS — I4891 Unspecified atrial fibrillation: Secondary | ICD-10-CM | POA: Diagnosis not present

## 2016-10-08 DIAGNOSIS — I5022 Chronic systolic (congestive) heart failure: Secondary | ICD-10-CM

## 2016-10-08 DIAGNOSIS — E876 Hypokalemia: Secondary | ICD-10-CM

## 2016-10-08 DIAGNOSIS — Z820 Family history of epilepsy and other diseases of the nervous system: Secondary | ICD-10-CM | POA: Diagnosis not present

## 2016-10-08 DIAGNOSIS — R17 Unspecified jaundice: Secondary | ICD-10-CM | POA: Diagnosis not present

## 2016-10-08 DIAGNOSIS — Z9181 History of falling: Secondary | ICD-10-CM | POA: Diagnosis not present

## 2016-10-08 DIAGNOSIS — Z951 Presence of aortocoronary bypass graft: Secondary | ICD-10-CM | POA: Diagnosis not present

## 2016-10-08 DIAGNOSIS — I129 Hypertensive chronic kidney disease with stage 1 through stage 4 chronic kidney disease, or unspecified chronic kidney disease: Secondary | ICD-10-CM | POA: Diagnosis not present

## 2016-10-08 DIAGNOSIS — R2689 Other abnormalities of gait and mobility: Secondary | ICD-10-CM | POA: Diagnosis not present

## 2016-10-08 DIAGNOSIS — R488 Other symbolic dysfunctions: Secondary | ICD-10-CM | POA: Diagnosis not present

## 2016-10-08 DIAGNOSIS — K7460 Unspecified cirrhosis of liver: Secondary | ICD-10-CM | POA: Diagnosis not present

## 2016-10-08 DIAGNOSIS — I2721 Secondary pulmonary arterial hypertension: Secondary | ICD-10-CM | POA: Diagnosis not present

## 2016-10-08 DIAGNOSIS — S0990XA Unspecified injury of head, initial encounter: Secondary | ICD-10-CM | POA: Diagnosis not present

## 2016-10-08 DIAGNOSIS — I480 Paroxysmal atrial fibrillation: Secondary | ICD-10-CM | POA: Diagnosis not present

## 2016-10-08 DIAGNOSIS — R188 Other ascites: Secondary | ICD-10-CM | POA: Diagnosis not present

## 2016-10-08 DIAGNOSIS — R319 Hematuria, unspecified: Secondary | ICD-10-CM | POA: Diagnosis not present

## 2016-10-08 DIAGNOSIS — N390 Urinary tract infection, site not specified: Principal | ICD-10-CM

## 2016-10-08 DIAGNOSIS — I5032 Chronic diastolic (congestive) heart failure: Secondary | ICD-10-CM | POA: Diagnosis not present

## 2016-10-08 DIAGNOSIS — R4182 Altered mental status, unspecified: Secondary | ICD-10-CM | POA: Diagnosis not present

## 2016-10-08 DIAGNOSIS — E039 Hypothyroidism, unspecified: Secondary | ICD-10-CM | POA: Diagnosis not present

## 2016-10-08 DIAGNOSIS — E871 Hypo-osmolality and hyponatremia: Secondary | ICD-10-CM | POA: Diagnosis not present

## 2016-10-08 DIAGNOSIS — I429 Cardiomyopathy, unspecified: Secondary | ICD-10-CM | POA: Diagnosis not present

## 2016-10-08 DIAGNOSIS — Z86711 Personal history of pulmonary embolism: Secondary | ICD-10-CM | POA: Diagnosis not present

## 2016-10-08 DIAGNOSIS — N179 Acute kidney failure, unspecified: Secondary | ICD-10-CM | POA: Diagnosis not present

## 2016-10-08 DIAGNOSIS — I2781 Cor pulmonale (chronic): Secondary | ICD-10-CM

## 2016-10-08 DIAGNOSIS — E113599 Type 2 diabetes mellitus with proliferative diabetic retinopathy without macular edema, unspecified eye: Secondary | ICD-10-CM | POA: Diagnosis not present

## 2016-10-08 DIAGNOSIS — Z87891 Personal history of nicotine dependence: Secondary | ICD-10-CM | POA: Diagnosis not present

## 2016-10-08 DIAGNOSIS — E7800 Pure hypercholesterolemia, unspecified: Secondary | ICD-10-CM | POA: Diagnosis not present

## 2016-10-08 DIAGNOSIS — K7290 Hepatic failure, unspecified without coma: Secondary | ICD-10-CM | POA: Diagnosis not present

## 2016-10-08 DIAGNOSIS — B952 Enterococcus as the cause of diseases classified elsewhere: Secondary | ICD-10-CM | POA: Diagnosis not present

## 2016-10-08 DIAGNOSIS — I130 Hypertensive heart and chronic kidney disease with heart failure and stage 1 through stage 4 chronic kidney disease, or unspecified chronic kidney disease: Secondary | ICD-10-CM | POA: Diagnosis not present

## 2016-10-08 LAB — COMPREHENSIVE METABOLIC PANEL
ALK PHOS: 144 U/L — AB (ref 38–126)
ALT: 22 U/L (ref 17–63)
ANION GAP: 13 (ref 5–15)
AST: 34 U/L (ref 15–41)
Albumin: 3.3 g/dL — ABNORMAL LOW (ref 3.5–5.0)
BILIRUBIN TOTAL: 4.9 mg/dL — AB (ref 0.3–1.2)
BUN: 94 mg/dL — ABNORMAL HIGH (ref 6–20)
CALCIUM: 9.4 mg/dL (ref 8.9–10.3)
CO2: 24 mmol/L (ref 22–32)
CREATININE: 1.5 mg/dL — AB (ref 0.61–1.24)
Chloride: 93 mmol/L — ABNORMAL LOW (ref 101–111)
GFR calc non Af Amer: 43 mL/min — ABNORMAL LOW (ref >60)
GFR, EST AFRICAN AMERICAN: 50 mL/min — AB (ref >60)
GLUCOSE: 165 mg/dL — AB (ref 65–99)
Potassium: 3.5 mmol/L (ref 3.5–5.1)
Sodium: 130 mmol/L — ABNORMAL LOW (ref 135–145)
TOTAL PROTEIN: 8.2 g/dL — AB (ref 6.5–8.1)

## 2016-10-08 LAB — CBC
HCT: 38.2 % — ABNORMAL LOW (ref 39.0–52.0)
HEMOGLOBIN: 13.2 g/dL (ref 13.0–17.0)
MCH: 29.8 pg (ref 26.0–34.0)
MCHC: 34.6 g/dL (ref 30.0–36.0)
MCV: 86.2 fL (ref 78.0–100.0)
PLATELETS: 139 10*3/uL — AB (ref 150–400)
RBC: 4.43 MIL/uL (ref 4.22–5.81)
RDW: 15.8 % — ABNORMAL HIGH (ref 11.5–15.5)
WBC: 5.3 10*3/uL (ref 4.0–10.5)

## 2016-10-08 LAB — GLUCOSE, CAPILLARY
GLUCOSE-CAPILLARY: 146 mg/dL — AB (ref 65–99)
Glucose-Capillary: 222 mg/dL — ABNORMAL HIGH (ref 65–99)
Glucose-Capillary: 214 mg/dL — ABNORMAL HIGH (ref 65–99)
Glucose-Capillary: 160 mg/dL — ABNORMAL HIGH (ref 65–99)

## 2016-10-08 LAB — AMMONIA: Ammonia: 153 umol/L — ABNORMAL HIGH (ref 9–35)

## 2016-10-08 MED ORDER — LACTULOSE 10 GM/15ML PO SOLN
20.0000 g | Freq: Three times a day (TID) | ORAL | Status: DC
  Administered 2016-10-08 – 2016-10-17 (×27): 20 g via ORAL
  Filled 2016-10-08 (×28): qty 30

## 2016-10-08 MED ORDER — POTASSIUM CHLORIDE CRYS ER 20 MEQ PO TBCR
40.0000 meq | EXTENDED_RELEASE_TABLET | Freq: Once | ORAL | Status: AC
  Administered 2016-10-08: 40 meq via ORAL
  Filled 2016-10-08: qty 2

## 2016-10-08 NOTE — Progress Notes (Signed)
PROGRESS NOTE                                                                                                                                                                                                             Patient Demographics:    [name], is a 75 y.o. male, DOB - Jun 05, 1941, ZOX:[id number]  Admit date - 10/07/2016   Admitting Physician Penny Pia, MD  Outpatient Primary MD for the patient is Francisco Massed, MD  LOS - 0  Outpatient Specialists: Dr Algie Coffer  Chief Complaint  Patient presents with  . 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 fib not on anticoagulation due to prior history of GI bleed and nonadherence to medications,, CK D stage III, coronary artery disease, hypertension, type 2 diabetes mellitus, umbilical hernia and prior history of PE was brought to the ED by his wife with 5 days of confusion. Patient had an unwitnessed fall at home 5 days back following which he started having increased confusion. Wife also noticed that he was having foul-smelling urine. No fevers or chills, nausea, vomiting, chest pain, shortness of breath, abdominal pain, diarrhea, pain in joints. Patient has been ambulating with difficulty and unsteady. In the ED he was afebrile but confused. Blood work showed normal WBC, sodium of 129, potassium of 3.3, chloride of 90, BUN of 92 and creatinine 1.67. Glucose of 216 and elevated ammonia of 175. He had elevated total bilirubin with normal AST/ALT and elevated alkaline phosphatase. Lactic acid was normal. UA was suggestive of UTI. Chest x-ray was unremarkable. CT of the abdomen and pelvis showed cirrhotic liver with small ascites and right pleural effusion.  Patient admitted for  acute metabolic encephalopathy is secondary to UTI and/or hepatic encephalopathy.   Subjective:   Patient appears much oriented today as per wife. He is close to his baseline.   Assessment  & Plan :   Acute metabolic encephalopathy Suspect this is more likely due to UTI than hepatic encephalopathy (although pneumonia is markedly elevated). Continue empiric Rocephin. Follow urine culture. Blood culture negative. Mental status about 80% of baseline as per wife. -Ammonia level markedly elevated and is on scheduled lactulose titrated  to at least 2-3 loose bowel movements daily.   Active Problems: Cirrhosis of liver Appears to be associated with severe right heart failure. Patient gets periodic paracentesis (mostly sent by his cardiologist Dr. Algie CofferKadakia). Follow hepatitis panel. He has never had a GI evaluation done in the past. May benefit from having an outpt GI eval.  Has minimal ascites and does not need paracentesis at present.  Severe systolic congestive heart failure Patient appears euvolemic. 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 Continue Lasix and Aldactone. Monitor strict I/O and daily weight.     DM II (diabetes mellitus, type II), controlled (HCC) Continue home dose Lantus with sliding scale coverage.    Atrial fibrillation (HCC) Rate controlled. Continue beta blocker. Not an anticoagulation due to history of? GI bleed and nonadherence to medication.  Hyponatremia Suspect chronic with hypervolemia and cirrhosis. monitor for now.  Recent fall with unsteady gait. Obtain head CT to rule out any intracranial bleed or injury.. PT eval.  Chronic kidney disease stage II-III Renal function at baseline. Continue to monitor.  Hypokalemia Replenished  Code Status : Full code  Family Communication  : Wife at bedside  Disposition Plan  : Home if clinically improved tomorrow,  Barriers For Discharge : Pending  clinical improvement, urine culture and head CT. Pending PT evaluation.  Consults  :  None  Procedures  : CT abdomen and pelvis  DVT Prophylaxis  :  Heparin  Lab Results  Component Value Date   PLT 139 (L) 10/08/2016    Antibiotics  :    Anti-infectives    Start     Dose/Rate Route Frequency Ordered Stop   10/08/16 1800  cefTRIAXone (ROCEPHIN) 1 g in dextrose 5 % 50 mL IVPB     1 g 100 mL/hr over 30 Minutes Intravenous Every 24 hours 10/07/16 1851     10/07/16 1745  cefTRIAXone (ROCEPHIN) 1 g in dextrose 5 % 50 mL IVPB     1 g 100 mL/hr over 30 Minutes Intravenous  Once 10/07/16 1731 10/07/16 1947        Objective:   Vitals:   10/08/16 0008 10/08/16 0122 10/08/16 0448 10/08/16 1100  BP:  114/62 113/76 116/66  Pulse:  69 77 71  Resp:  16 16   Temp: 97.9 F (36.6 C) 98.3 F (36.8 C) 98.5 F (36.9 C)   TempSrc: Oral Oral Oral   SpO2:  99% 98%   Weight:      Height:        Wt Readings from Last 3 Encounters:  10/07/16 61.2 kg (135 lb)  09/06/16 67.9 kg (149 lb 12.8 oz)  09/04/16 66.7 kg (147 lb)     Intake/Output Summary (Last 24 hours) at 10/08/16 1324 Last data filed at 10/08/16 [id number]  Gross per 24 hour  Intake             2480 ml  Output              700 ml  Net             1780 ml     Physical Exam  Gen: not in distress HEENT: no pallor, moist mucosa, supple neck Chest: Diminished breath sounds over right lung base CVS: N S1&S2, *leg murmur 3/6, GI: soft, mild distention with umbilical hernia, bowel sounds present, nontender Musculoskeletal: warm, no edema CNS: Alert and oriented 2-3, no tremors    Data Review:    CBC  Recent Labs Lab 10/07/16 1429 10/08/16 0341  WBC 5.8 5.3  HGB 13.7 13.2  HCT 37.5* 38.2*  PLT 136* 139*  MCV 84.8 86.2  MCH 31.0 29.8  MCHC 36.5* 34.6  RDW 15.7* 15.8*    Chemistries   Recent Labs Lab 10/07/16 1429 10/08/16 0341  NA 129* 130*  K 3.3* 3.5  CL 90* 93*  CO2 27 24  GLUCOSE 216* 165*  BUN  92* 94*  CREATININE 1.67* 1.50*  CALCIUM 9.4 9.4  AST 39 34  ALT 26 22  ALKPHOS 160* 144*  BILITOT 5.4* 4.9*   ------------------------------------------------------------------------------------------------------------------ No results for input(s): CHOL, HDL, LDLCALC, TRIG, CHOLHDL, LDLDIRECT in the last 72 hours.  Lab Results  Component Value Date   HGBA1C 7.2 (H) 07/11/2016   ------------------------------------------------------------------------------------------------------------------ No results for input(s): TSH, T4TOTAL, T3FREE, THYROIDAB in the last 72 hours.  Invalid input(s): FREET3 ------------------------------------------------------------------------------------------------------------------ No results for input(s): VITAMINB12, FOLATE, FERRITIN, TIBC, IRON, RETICCTPCT in the last 72 hours.  Coagulation profile  Recent Labs Lab 10/07/16 1429  INR 0.96    No results for input(s): DDIMER in the last 72 hours.  Cardiac Enzymes No results for input(s): CKMB, TROPONINI, MYOGLOBIN in the last 168 hours.  Invalid input(s): CK ------------------------------------------------------------------------------------------------------------------    Component Value Date/Time   BNP 921.0 (H) 03/27/2016 1900    Inpatient Medications  Scheduled Meds: . carvedilol  3.125 mg Oral BID WC  . cefTRIAXone (ROCEPHIN)  IV  1 g Intravenous Q24H  . furosemide  80 mg Oral Daily  . heparin  5,000 Units Subcutaneous Q8H  . insulin aspart  0-15 Units Subcutaneous TID WC  . insulin aspart  0-5 Units Subcutaneous QHS  . lactulose  20 g Oral TID  . sodium chloride flush  3 mL Intravenous Q12H  . spironolactone  25 mg Oral Daily  . vitamin B-12  1,000 mcg Oral Daily  . vitamin C  500 mg Oral Daily   Continuous Infusions: PRN Meds:.sodium chloride, sodium chloride flush  Micro Results Recent Results (from the past 240 hour(s))  Blood culture (routine x 2)     Status: None  (Preliminary result)   Collection Time: 10/07/16  5:05 PM  Result Value Ref Range Status   Specimen Description BLOOD LEFT ANTECUBITAL  Final   Special Requests BOTTLES DRAWN AEROBIC AND ANAEROBIC 5 CC EACH  Final   Culture   Final    NO GROWTH < 24 HOURS Performed at [facility]    Report Status PENDING  Incomplete  Blood culture (routine x 2)     Status: None (Preliminary result)   Collection Time: 10/07/16  5:05 PM  Result Value Ref Range Status   Specimen Description BLOOD RIGHT ARM  Final   Special Requests BOTTLES DRAWN AEROBIC AND ANAEROBIC 5 CC EACH  Final   Culture   Final    NO GROWTH < 24 HOURS Performed at [facility]    Report Status PENDING  Incomplete    Radiology Reports Dg Chest 2 View  Result Date: 10/07/2016 CLINICAL DATA:  Weakness, recent falls EXAM: CHEST  2 VIEW COMPARISON:  03/27/2016 FINDINGS: Cardiac shadow is enlarged. Postsurgical changes are again seen. The lungs are well aerated bilaterally with mild central vascular congestion. No focal infiltrate or sizable effusion is seen. No bony abnormality is noted. IMPRESSION: Mild vascular congestion. Electronically Signed   By: Alcide Clever M.D.   On: 10/07/2016 15:53   Ct Abdomen Pelvis W Contrast  Result Date: 10/07/2016 CLINICAL  DATA:  Status post fall 5 days ago. Increasing weakness over the past 2 weeks. EXAM: CT ABDOMEN AND PELVIS WITH CONTRAST TECHNIQUE: Multidetector CT imaging of the abdomen and pelvis was performed using the standard protocol following bolus administration of intravenous contrast. CONTRAST:  75 ml ISOVUE-300 IOPAMIDOL (ISOVUE-300) INJECTION 61% COMPARISON:  None. FINDINGS: Lower chest: Trace right pleural effusion is noted. No left pleural effusion or pericardial effusion. Calcific coronary artery disease is seen. Gynecomastia is identified. Mild dependent atelectasis is seen in the right base. Hepatobiliary: The liver is shrunken with a nodular border consistent with  cirrhosis. The portal vein is patent with laminar flow on early images seen in the SMV and portal veins. No focal liver lesion. Several small gallstones are identified. Pancreas: Unremarkable. No pancreatic ductal dilatation or surrounding inflammatory changes. Spleen: Normal in size without focal abnormality. Adrenals/Urinary Tract: Small low attenuating lesions in the right kidney cannot be definitively characterized but are likely cysts. Scarring is noted in the upper pole of the right kidney. The adrenal glands appear normal. Urinary bladder is distended but otherwise unremarkable. Stomach/Bowel: Colonic diverticula without diverticulitis noted the stomach and small bowel appear normal. Vascular/Lymphatic: Aortoiliac atherosclerosis without aneurysm is identified. No lymphadenopathy. Reproductive: Negative.  Penile prosthesis noted. Other: Small volume of abdominal ascites is seen.  No hernia. Musculoskeletal: Scoliosis and multilevel spondylosis are identified. Multiple Schmorl's nodes are seen. No worrisome lesion is identified. IMPRESSION: Cirrhotic liver with associated small volume of abdominal ascites. Trace right pleural effusion. Cardiomegaly. Calcific aortic and coronary atherosclerosis. Gallstones without cholecystitis. Diverticulosis without diverticulitis. Gynecomastia. Electronically Signed   By: Drusilla Kannerhomas  Dalessio M.D.   On: 10/07/2016 19:00    Time Spent in minutes  25   Eddie NorthHUNGEL, Terrin Meddaugh M.D on 10/08/2016 at 1:24 PM  Between 7am to 7pm - Pager - [phone]  After 7pm go to [email or web address] - password [email or web address]  [facility] -  Office  [phone]

## 2016-10-09 ENCOUNTER — Inpatient Hospital Stay (HOSPITAL_COMMUNITY): Payer: PPO

## 2016-10-09 ENCOUNTER — Encounter: Payer: Self-pay | Admitting: Family Medicine

## 2016-10-09 DIAGNOSIS — K7682 Hepatic encephalopathy: Secondary | ICD-10-CM

## 2016-10-09 DIAGNOSIS — K7290 Hepatic failure, unspecified without coma: Secondary | ICD-10-CM

## 2016-10-09 LAB — BASIC METABOLIC PANEL
ANION GAP: 12 (ref 5–15)
BUN: 93 mg/dL — ABNORMAL HIGH (ref 6–20)
CO2: 24 mmol/L (ref 22–32)
Calcium: 9.6 mg/dL (ref 8.9–10.3)
Chloride: 90 mmol/L — ABNORMAL LOW (ref 101–111)
Creatinine, Ser: 2.02 mg/dL — ABNORMAL HIGH (ref 0.61–1.24)
GFR, EST AFRICAN AMERICAN: 35 mL/min — AB (ref >60)
GFR, EST NON AFRICAN AMERICAN: 30 mL/min — AB (ref >60)
Glucose, Bld: 200 mg/dL — ABNORMAL HIGH (ref 65–99)
POTASSIUM: 4.2 mmol/L (ref 3.5–5.1)
SODIUM: 126 mmol/L — AB (ref 135–145)

## 2016-10-09 LAB — GLUCOSE, CAPILLARY
GLUCOSE-CAPILLARY: 187 mg/dL — AB (ref 65–99)
GLUCOSE-CAPILLARY: 236 mg/dL — AB (ref 65–99)
Glucose-Capillary: 307 mg/dL — ABNORMAL HIGH (ref 65–99)
Glucose-Capillary: 231 mg/dL — ABNORMAL HIGH (ref 65–99)

## 2016-10-09 LAB — HEPATITIS PANEL, ACUTE
HCV Ab: <0.1 s/co ratio (ref 0.0–0.9)
HEP B C IGM: NEGATIVE
HEP B S AG: NEGATIVE
Hep A IgM: NEGATIVE

## 2016-10-09 MED ORDER — SODIUM CHLORIDE 0.9 % IV SOLN
INTRAVENOUS | Status: AC
  Administered 2016-10-09: 14:00:00 via INTRAVENOUS
  Filled 2016-10-09: qty 1000

## 2016-10-09 NOTE — Progress Notes (Signed)
PT Cancellation Note  Patient Details Name: Francisco RidgeRaymond Grose MRN: [id number] DOB: 02-19-1941   Cancelled Treatment:    Reason Eval/Treat Not Completed: Fatigue/lethargy limiting ability to participate;Patient at procedure or test/unavailable (at 1212, patient c/o fatigue and [facility], gone from room. )   Rada HayHill, Briani Maul Elizabeth 10/09/2016, 3:55 PM Blanchard KelchKaren Zandon [place] PT [phone]

## 2016-10-09 NOTE — Progress Notes (Signed)
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 fib not on anticoagulation due to prior history of GI bleed and nonadherence to medications,, CK D stage III, coronary artery disease, hypertension, type 2 diabetes mellitus, umbilical hernia and prior history of PE was brought to the ED by his wife with 5 days of confusion. Patient had an unwitnessed fall at home 5 days back following which he started having increased confusion. Wife also noticed that he was having foul-smelling urine. No fevers or chills, nausea, vomiting, chest pain, shortness of breath, abdominal pain, diarrhea, pain in joints. Patient has been ambulating with difficulty and unsteady. In the ED he was afebrile but confused. Blood work showed normal WBC, sodium of 129, potassium of 3.3, chloride of 90, BUN of 92 and creatinine 1.67. Glucose of 216 and elevated ammonia of 175. He had elevated total bilirubin with normal AST/ALT and elevated alkaline phosphatase. Lactic acid was normal. UA was suggestive of UTI. Chest x-ray was unremarkable. CT of the abdomen and pelvis showed cirrhotic liver with small ascites and right pleural effusion.  Patient admitted for acute metabolic encephalopathy  secondary to UTI and hepatic encephalopathy.   Assessment/Plan: Acute metabolic encephalopathy -Secondary to UTI and elevated ammonia -Continue ceftriaxone pending culture data -Continue lactulose -Mental status improving per wife at the bedside--close to baseline  UTI -Continue ceftriaxone pending culture data  Hepatic encephalopathy -Patient has liver cirrhosis likely secondary to chronic hepatic congestion from the patient's pulmonary  hypertension -Ammonia 175 at the time of admission -Repeat ammonia -Continue lactulose--patient had been refusing intermittently -Discussed with the patient importance of taking lactulose   Active Problems: Cirrhosis of liver -Secondary to chronic hepatic congestion from pulmonary hypertension and likely cor pulmonale -Hepatitis viral serologies negative  Chronic systolic congestive heart failure -Euvolemic clinically -Admission weight 135 pounds -holding lasix due to increasing serum creatinine -Continue carvedilol and spironolactone  Acute on chronic renal failure--CKD stage III -Baseline creatinine 1.4-1.6 -Hold furosemide -give NS x 500 cc as he appears dry clinically    DM II (diabetes mellitus, type II), controlled (HCC) -07/11/2016, A1c 7.2 -Continue NovoLog sliding scale    Atrial fibrillation (HCC) Rate controlled.  -Continue beta blocker.  -Not an anticoagulation due to history of? GI bleed and nonadherence to medication.  Hyponatremia -Chronic ranging 129-134, likely due to CHF and cirrhosis  Recent fall with unsteady gait. Obtain head CT to rule out any intracranial bleed or injury.. PT eval.  Hypokalemia Replenished   Disposition Plan:   Home in 1-2 days  Family Communication:   Spouse updated at bedside 12/20--Total time spent 35 minutes.  Greater than 50% spent face to face counseling and coordinating care.   Consultants:  none  Code Status:  FULL  DVT Prophylaxis:  [place] Heparin   Procedures: As Listed in Progress Note Above  Antibiotics: None    Subjective: Patient denies fevers, chills, headache, chest pain, dyspnea, nausea, vomiting, diarrhea, abdominal pain, dysuria, hematuria, hematochezia, and melena.   Objective: Vitals:   10/08/16 1400 10/08/16 2033 10/09/16 0430 10/09/16 0846  BP: 119/74 110/64 113/69 (!) 113/57  Pulse: 99 77 69 72  Resp: 16 18 16    Temp: 98 F (36.7 C) 98 F (36.7 C) 98.4 F (36.9 C)  TempSrc:  Oral Oral Oral   SpO2: 97% 98% 99%   Weight:      Height:        Intake/Output Summary (Last 24 hours) at 10/09/16 1330 Last data filed at 10/09/16 1119  Gross per 24 hour  Intake              965 ml  Output                0 ml  Net              965 ml   Weight change:  Exam:   General:  Pt is alert, follows commands appropriately, not in acute distress  HEENT: No icterus, No thrush, No neck mass, [place]/AT  Cardiovascular: IRRR, S1/S2, no rubs, no gallops  Respiratory: CTA bilaterally, no wheezing, no crackles, no rhonchi  Abdomen: Soft/+BS, non tender, non distended, no guarding  Extremities: No edema, No lymphangitis, No petechiae, No rashes, no synovitis   Data Reviewed: I have personally reviewed following labs and imaging studies Basic Metabolic Panel:  Recent Labs Lab 10/07/16 1429 10/08/16 0341 10/09/16 0355  NA 129* 130* 126*  K 3.3* 3.5 4.2  CL 90* 93* 90*  CO2 27 24 24   GLUCOSE 216* 165* 200*  BUN 92* 94* 93*  CREATININE 1.67* 1.50* 2.02*  CALCIUM 9.4 9.4 9.6   Liver Function Tests:  Recent Labs Lab 10/07/16 1429 10/08/16 0341  AST 39 34  ALT 26 22  ALKPHOS 160* 144*  BILITOT 5.4* 4.9*  PROT 8.1 8.2*  ALBUMIN 3.1* 3.3*    Recent Labs Lab 10/07/16 1429  LIPASE 47    Recent Labs Lab 10/07/16 1521 10/08/16 0341  AMMONIA 175* 153*   Coagulation Profile:  Recent Labs Lab 10/07/16 1429  INR 0.96   CBC:  Recent Labs Lab 10/07/16 1429 10/08/16 0341  WBC 5.8 5.3  HGB 13.7 13.2  HCT 37.5* 38.2*  MCV 84.8 86.2  PLT 136* 139*   Cardiac Enzymes: No results for input(s): CKTOTAL, CKMB, CKMBINDEX, TROPONINI in the last 168 hours. BNP: Invalid input(s): POCBNP CBG:  Recent Labs Lab 10/08/16 1222 10/08/16 1715 10/08/16 2007 10/09/16 0730 10/09/16 1143  GLUCAP 222* 214* 160* 187* 236*   HbA1C: No results for input(s): HGBA1C in the last 72 hours. Urine analysis:    Component Value Date/Time   COLORURINE YELLOW  10/07/2016 1520   APPEARANCEUR TURBID (A) 10/07/2016 1520   LABSPEC 1.008 10/07/2016 1520   PHURINE 7.0 10/07/2016 1520   GLUCOSEU NEGATIVE 10/07/2016 1520   HGBUR SMALL (A) 10/07/2016 1520   BILIRUBINUR NEGATIVE 10/07/2016 1520   BILIRUBINUR neg 02/21/2015 1456   KETONESUR NEGATIVE 10/07/2016 1520   PROTEINUR NEGATIVE 10/07/2016 1520   UROBILINOGEN 0.2 02/21/2015 1456   NITRITE NEGATIVE 10/07/2016 1520   LEUKOCYTESUR LARGE (A) 10/07/2016 1520   Sepsis Labs: @LABRCNTIP (procalcitonin:4,lacticidven:4) ) Recent Results (from the past 240 hour(s))  Culture, Urine     Status: None (Preliminary result)   Collection Time: 10/07/16  3:20 PM  Result Value Ref Range Status   Specimen Description URINE, RANDOM  Final   Special Requests NONE  Final   Culture   Final    CULTURE REINCUBATED FOR BETTER GROWTH [facility]    Report Status PENDING  Incomplete  Blood culture (routine x 2)     Status: None (Preliminary result)   Collection Time: 10/07/16  5:05 PM  Result Value Ref Range Status   Specimen Description BLOOD LEFT  ANTECUBITAL  Final   Special Requests BOTTLES DRAWN AEROBIC AND ANAEROBIC 5 CC EACH  Final   Culture   Final    NO GROWTH 2 DAYS Performed at [facility]    Report Status PENDING  Incomplete  Blood culture (routine x 2)     Status: None (Preliminary result)   Collection Time: 10/07/16  5:05 PM  Result Value Ref Range Status   Specimen Description BLOOD RIGHT ARM  Final   Special Requests BOTTLES DRAWN AEROBIC AND ANAEROBIC 5 CC EACH  Final   Culture   Final    NO GROWTH 2 DAYS Performed at [facility]    Report Status PENDING  Incomplete     Scheduled Meds: . carvedilol  3.125 mg Oral BID WC  . cefTRIAXone (ROCEPHIN)  IV  1 g Intravenous Q24H  . furosemide  80 mg Oral Daily  . heparin  5,000 Units Subcutaneous Q8H  . insulin aspart  0-15 Units Subcutaneous TID WC  . insulin aspart  0-5 Units Subcutaneous QHS  .  lactulose  20 g Oral TID  . sodium chloride flush  3 mL Intravenous Q12H  . spironolactone  25 mg Oral Daily  . vitamin B-12  1,000 mcg Oral Daily  . vitamin C  500 mg Oral Daily   Continuous Infusions:  Procedures/Studies: Dg Chest 2 View  Result Date: 10/07/2016 CLINICAL DATA:  Weakness, recent falls EXAM: CHEST  2 VIEW COMPARISON:  03/27/2016 FINDINGS: Cardiac shadow is enlarged. Postsurgical changes are again seen. The lungs are well aerated bilaterally with mild central vascular congestion. No focal infiltrate or sizable effusion is seen. No bony abnormality is noted. IMPRESSION: Mild vascular congestion. Electronically Signed   By: Alcide Clever M.D.   On: 10/07/2016 15:53   Ct Abdomen Pelvis W Contrast  Result Date: 10/07/2016 CLINICAL DATA:  Status post fall 5 days ago. Increasing weakness over the past 2 weeks. EXAM: CT ABDOMEN AND PELVIS WITH CONTRAST TECHNIQUE: Multidetector CT imaging of the abdomen and pelvis was performed using the standard protocol following bolus administration of intravenous contrast. CONTRAST:  75 ml ISOVUE-300 IOPAMIDOL (ISOVUE-300) INJECTION 61% COMPARISON:  None. FINDINGS: Lower chest: Trace right pleural effusion is noted. No left pleural effusion or pericardial effusion. Calcific coronary artery disease is seen. Gynecomastia is identified. Mild dependent atelectasis is seen in the right base. Hepatobiliary: The liver is shrunken with a nodular border consistent with cirrhosis. The portal vein is patent with laminar flow on early images seen in the SMV and portal veins. No focal liver lesion. Several small gallstones are identified. Pancreas: Unremarkable. No pancreatic ductal dilatation or surrounding inflammatory changes. Spleen: Normal in size without focal abnormality. Adrenals/Urinary Tract: Small low attenuating lesions in the right kidney cannot be definitively characterized but are likely cysts. Scarring is noted in the upper pole of the right kidney.  The adrenal glands appear normal. Urinary bladder is distended but otherwise unremarkable. Stomach/Bowel: Colonic diverticula without diverticulitis noted the stomach and small bowel appear normal. Vascular/Lymphatic: Aortoiliac atherosclerosis without aneurysm is identified. No lymphadenopathy. Reproductive: Negative.  Penile prosthesis noted. Other: Small volume of abdominal ascites is seen.  No hernia. Musculoskeletal: Scoliosis and multilevel spondylosis are identified. Multiple Schmorl's nodes are seen. No worrisome lesion is identified. IMPRESSION: Cirrhotic liver with associated small volume of abdominal ascites. Trace right pleural effusion. Cardiomegaly. Calcific aortic and coronary atherosclerosis. Gallstones without cholecystitis. Diverticulosis without diverticulitis. Gynecomastia. Electronically Signed   By: Drusilla Kanner M.D.   On: 10/07/2016  19:00    Betheny Suchecki, DO  [facility] Pager [phone]  If 7PM-7AM, please contact night-coverage [email or web address] Password [email or web address] 10/09/2016, 1:30 PM   LOS: 1 day

## 2016-10-10 ENCOUNTER — Ambulatory Visit: Payer: PPO | Admitting: Family Medicine

## 2016-10-10 DIAGNOSIS — E1122 Type 2 diabetes mellitus with diabetic chronic kidney disease: Secondary | ICD-10-CM

## 2016-10-10 DIAGNOSIS — R319 Hematuria, unspecified: Secondary | ICD-10-CM

## 2016-10-10 DIAGNOSIS — Z794 Long term (current) use of insulin: Secondary | ICD-10-CM

## 2016-10-10 DIAGNOSIS — N183 Chronic kidney disease, stage 3 (moderate): Secondary | ICD-10-CM

## 2016-10-10 LAB — BASIC METABOLIC PANEL
Anion gap: 14 (ref 5–15)
BUN: 90 mg/dL — AB (ref 6–20)
CALCIUM: 9.4 mg/dL (ref 8.9–10.3)
CO2: 20 mmol/L — ABNORMAL LOW (ref 22–32)
CREATININE: 1.84 mg/dL — AB (ref 0.61–1.24)
Chloride: 92 mmol/L — ABNORMAL LOW (ref 101–111)
GFR, EST AFRICAN AMERICAN: 39 mL/min — AB (ref >60)
GFR, EST NON AFRICAN AMERICAN: 34 mL/min — AB (ref >60)
Glucose, Bld: 123 mg/dL — ABNORMAL HIGH (ref 65–99)
Potassium: 3.5 mmol/L (ref 3.5–5.1)
SODIUM: 126 mmol/L — AB (ref 135–145)

## 2016-10-10 LAB — GLUCOSE, CAPILLARY
GLUCOSE-CAPILLARY: 197 mg/dL — AB (ref 65–99)
GLUCOSE-CAPILLARY: 214 mg/dL — AB (ref 65–99)
GLUCOSE-CAPILLARY: 241 mg/dL — AB (ref 65–99)
Glucose-Capillary: 119 mg/dL — ABNORMAL HIGH (ref 65–99)
Glucose-Capillary: 176 mg/dL — ABNORMAL HIGH (ref 65–99)

## 2016-10-10 LAB — MAGNESIUM: MAGNESIUM: 2.1 mg/dL (ref 1.7–2.4)

## 2016-10-10 LAB — URINE CULTURE: Culture: >=100000 — AB

## 2016-10-10 LAB — AMMONIA: AMMONIA: 87 umol/L — AB (ref 9–35)

## 2016-10-10 MED ORDER — RIFAXIMIN 550 MG PO TABS
550.0000 mg | ORAL_TABLET | Freq: Two times a day (BID) | ORAL | Status: DC
  Administered 2016-10-10 – 2016-10-17 (×14): 550 mg via ORAL
  Filled 2016-10-10 (×14): qty 1

## 2016-10-10 MED ORDER — AMOXICILLIN 250 MG PO CAPS
500.0000 mg | ORAL_CAPSULE | Freq: Three times a day (TID) | ORAL | Status: DC
  Administered 2016-10-10 – 2016-10-17 (×21): 500 mg via ORAL
  Filled 2016-10-10 (×21): qty 2

## 2016-10-10 NOTE — Evaluation (Signed)
Physical Therapy Evaluation Patient Details Name: [name] MRN: [id number] DOB: Jun 28, 1941 Today's Date: 10/10/2016   History of Present Illness  [name] is a 75 y.o. male with medical history significant of CHF presumed diastolic, Afib presenting with 5 days of confusion. Pt had fall around that time. Wife reports that his confusion has persisted and is getting worse. It has been gradual. Also associate with foul smelling urine.   Clinical Impression  The patient is very weak and  Barely able to ambulate with 2 persons. No family present to discuss DC plan and caregivers. Pt admitted with above diagnosis. Pt currently with functional limitations due to the deficits listed below (see PT Problem List). Pt will benefit from skilled PT to increase their independence and safety with mobility to allow discharge to the venue listed below.       Follow Up Recommendations SNF;Supervision/Assistance - 24 hour (unless he has 24/7 assistance)    Equipment Recommendations   (unsure)    Recommendations for Other Services       Precautions / Restrictions Precautions Precautions: Fall      Mobility  Bed Mobility Overal bed mobility: Needs Assistance Bed Mobility: Supine to Sit     Supine to sit: Min assist     General bed mobility comments: assist with trunk, extra time  Transfers Overall transfer level: Needs assistance Equipment used: Rolling walker (2 wheeled) Transfers: Sit to/from Stand Sit to Stand: Mod assist         General transfer comment: steady assist to stand  Ambulation/Gait Ambulation/Gait assistance: Mod assist;+2 safety/equipment Ambulation Distance (Feet): 10 Feet Assistive device: Rolling walker (2 wheeled) Gait Pattern/deviations: Step-to pattern;Staggering left;Staggering right     General Gait Details: slow to initiate, steady assist to  stay within the RW. patient requestwed return to room after 5 '.   Stairs            Wheelchair  Mobility    Modified Rankin (Stroke Patients Only)       Balance Overall balance assessment: History of Falls;Needs assistance Sitting-balance support: Feet supported;Bilateral upper extremity supported Sitting balance-Leahy Scale: Fair     Standing balance support: Bilateral upper extremity supported;During functional activity Standing balance-Leahy Scale: Poor                               Pertinent Vitals/Pain Pain Assessment: No/denies pain    Home Living Family/patient expects to be discharged to:: Private residence Living Arrangements: Spouse/significant other Available Help at Discharge: Family Type of Home: House Home Access: Stairs to enter   Secretary/administratorntrance Stairs-Number of Steps: 2   Home Equipment: Walker - 2 wheels Additional Comments: patient vague about prior function and DME.     Prior Function           Comments: patient reports being home alone at times, wife not present     Hand Dominance        Extremity/Trunk Assessment   Upper Extremity Assessment Upper Extremity Assessment: Generalized weakness    Lower Extremity Assessment Lower Extremity Assessment: Generalized weakness    Cervical / Trunk Assessment Cervical / Trunk Assessment: Kyphotic  Communication   Communication: No difficulties  Cognition Arousal/Alertness: Awake/alert Behavior During Therapy: Flat affect Overall Cognitive Status: Difficult to assess                 General Comments: vague answers related to prior function    General Comments  Exercises     Assessment/Plan    PT Assessment Patient needs continued PT services  PT Problem List Decreased strength;Decreased activity tolerance;Decreased balance;Decreased mobility;Decreased knowledge of precautions;Decreased safety awareness;Decreased knowledge of use of DME;Decreased cognition          PT Treatment Interventions DME instruction;Gait training;Functional mobility  training;Therapeutic activities;Therapeutic exercise;Patient/family education    PT Goals (Current goals can be found in the Care Plan section)  Acute Rehab PT Goals Patient Stated Goal: agreed to walk PT Goal Formulation: Patient unable to participate in goal setting Time For Goal Achievement: 10/24/16 Potential to Achieve Goals: Fair    Frequency Min 3X/week   Barriers to discharge Decreased caregiver support      Co-evaluation               End of Session Equipment Utilized During Treatment: Gait belt Activity Tolerance: Patient limited by fatigue Patient left: in chair;with call bell/phone within reach;with chair alarm set;with nursing/sitter in room Nurse Communication: Mobility status         Time: [id number] PT Time Calculation (min) (ACUTE ONLY): 22 min   Charges:   PT Evaluation $PT Eval Low Complexity: 1 Procedure PT Treatments $Gait Training: 8-22 mins   PT G Codes:        Rada HayHill, Anjela Cassara Elizabeth 10/10/2016, 1:04 PM

## 2016-10-10 NOTE — Progress Notes (Signed)
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 fib not on anticoagulation due to prior history of GI bleed and nonadherence to medications,, CK D stage III, coronary artery disease, hypertension, type 2 diabetes mellitus, umbilical hernia and prior history of PE was brought to the ED by his wife with 5 days of confusion. Patient had an unwitnessed fall at home 5 days back following which he started having increased confusion. Wife also noticed that he was having foul-smelling urine. No fevers or chills, nausea, vomiting, chest pain, shortness of breath, abdominal pain, diarrhea, pain in joints. Patient has been ambulating with difficulty and unsteady. In the ED he was afebrile but confused. Blood work showed normal WBC, sodium of 129, potassium of 3.3, chloride of 90, BUN of 92 and creatinine 1.67. Glucose of 216 and elevated ammonia of 175. He had elevated total bilirubin with normal AST/ALT and elevated alkaline phosphatase. Lactic acid was normal. UA was suggestive of UTI. Chest x-ray was unremarkable. CT of the abdomen and pelvis showed cirrhotic liver with small ascites and right pleural effusion.  Patient admitted for acute metabolic encephalopathy  secondary to UTI and hepatic encephalopathy.   Assessment/Plan: Acute metabolic encephalopathy -Secondary to UTI and elevated ammonia -Continue ceftriaxone pending culture data-->amoxil -Continue lactulose -Mental status improving per wife at the bedside--but still confused  UTI -Continue ceftriaxone pending culture data-->E.faecalis -d/c ceftriaxone -start amoxil  Hepatic encephalopathy -Patient has liver cirrhosis likely secondary to chronic hepatic  congestion from the patient's pulmonary hypertension -Ammonia 175 at the time of admission-->87 -Repeat ammonia -mental status improving but not near baseline yet -Continue lactulose--patient had been refusing intermittently -Discussed with the patient importance of taking lactulose -add rifaximin  Active Problems: Cirrhosis of liver -Secondary to chronic hepatic congestion from pulmonary hypertension and likely cor pulmonale -Hepatitis viral serologies negative  Chronic systolic congestive heart failure -Euvolemic clinically -Admission weight 135 pounds -holding lasix due to increasing serum creatinine -Continue carvedilol and spironolactone  Acute on chronic renal failure--CKD stage III -Baseline creatinine 1.4-1.6 -serum creatinine peaked 2.05 -Hold furosemide -gived NS x 500 cc as he appears dry clinically 12/20  DM II (diabetes mellitus, type II), controlled (HCC) -07/11/2016, A1c 7.2 -Continue NovoLog sliding scale  Atrial fibrillation (HCC) Rate controlled.  -Continue beta blocker.  -Not an anticoagulation due to history of GI bleed and nonadherence to medication.  Hyponatremia -Chronic ranging 129-134, likely due to CHF and cirrhosis  Recent fall with unsteady gait. Obtain head CT to rule out any intracranial bleed or injury.. PT eval.  Hypokalemia Replenished   Disposition Plan:   SNF in 1-2 days  Family Communication:   Spouse updated at bedside 12/21--Total time spent 35 minutes.  Greater than 50% spent face to face counseling and coordinating care.   Consultants:  none  Code Status:  FULL  DVT Prophylaxis:  [place] Heparin   Procedures: As Listed in Progress Note Above  Antibiotics: None   Subjective: Patient is mildly confused, but able to answer questions properly and following commands.  Patient denies fevers, chills, headache, chest pain, dyspnea, nausea, vomiting, diarrhea, abdominal pain, dysuria, hematuria,  hematochezia, and melena.   Objective: Vitals:   10/09/16 2056 10/10/16 0442 10/10/16 0832 10/10/16 1414  BP: 92/63 119/74 120/80 110/73  Pulse: 90 76 77 68  Resp: 14 16  18   Temp: 98.2 F (36.8 C) 98.2 F (36.8 C)  98.3 F (36.8 C)  TempSrc: Oral Oral  Oral  SpO2: 98% 97%  99%  Weight:      Height:        Intake/Output Summary (Last 24 hours) at 10/10/16 1751 Last data filed at 10/10/16 1414  Gross per 24 hour  Intake          1880.83 ml  Output             1125 ml  Net           755.83 ml   Weight change:  Exam:   General:  Pt is alert, follows commands appropriately, not in acute distress  HEENT: No icterus, No thrush, No neck mass, [place]/AT  Cardiovascular: RRR, S1/S2, no rubs, no gallops  Respiratory: Bibasilar crackles and no wheeze. Good air movement.  Abdomen: Soft/+BS, non tender, non distended, no guarding  Extremities: No edema, No lymphangitis, No petechiae, No rashes, no synovitis   Data Reviewed: I have personally reviewed following labs and imaging studies Basic Metabolic Panel:  Recent Labs Lab 10/07/16 1429 10/08/16 0341 10/09/16 0355 10/10/16 0411  NA 129* 130* 126* 126*  K 3.3* 3.5 4.2 3.5  CL 90* 93* 90* 92*  CO2 27 24 24  20*  GLUCOSE 216* 165* 200* 123*  BUN 92* 94* 93* 90*  CREATININE 1.67* 1.50* 2.02* 1.84*  CALCIUM 9.4 9.4 9.6 9.4  MG  --   --   --  2.1   Liver Function Tests:  Recent Labs Lab 10/07/16 1429 10/08/16 0341  AST 39 34  ALT 26 22  ALKPHOS 160* 144*  BILITOT 5.4* 4.9*  PROT 8.1 8.2*  ALBUMIN 3.1* 3.3*    Recent Labs Lab 10/07/16 1429  LIPASE 47    Recent Labs Lab 10/07/16 1521 10/08/16 0341 10/10/16 0411  AMMONIA 175* 153* 87*   Coagulation Profile:  Recent Labs Lab 10/07/16 1429  INR 0.96   CBC:  Recent Labs Lab 10/07/16 1429 10/08/16 0341  WBC 5.8 5.3  HGB 13.7 13.2  HCT 37.5* 38.2*  MCV 84.8 86.2  PLT 136* 139*   Cardiac Enzymes: No results for input(s): CKTOTAL, CKMB,  CKMBINDEX, TROPONINI in the last 168 hours. BNP: Invalid input(s): POCBNP CBG:  Recent Labs Lab 10/09/16 2023 10/10/16 0745 10/10/16 1202 10/10/16 1245 10/10/16 1647  GLUCAP 231* 119* 176* 197* 214*   HbA1C: No results for input(s): HGBA1C in the last 72 hours. Urine analysis:    Component Value Date/Time   COLORURINE YELLOW 10/07/2016 1520   APPEARANCEUR TURBID (A) 10/07/2016 1520   LABSPEC 1.008 10/07/2016 1520   PHURINE 7.0 10/07/2016 1520   GLUCOSEU NEGATIVE 10/07/2016 1520   HGBUR SMALL (A) 10/07/2016 1520   BILIRUBINUR NEGATIVE 10/07/2016 1520   BILIRUBINUR neg 02/21/2015 1456   KETONESUR NEGATIVE 10/07/2016 1520   PROTEINUR NEGATIVE 10/07/2016 1520   UROBILINOGEN 0.2 02/21/2015 1456   NITRITE NEGATIVE 10/07/2016 1520   LEUKOCYTESUR LARGE (A) 10/07/2016 1520   Sepsis Labs: @LABRCNTIP (procalcitonin:4,lacticidven:4) ) Recent Results (from the past 240 hour(s))  Culture, Urine     Status: Abnormal   Collection Time: 10/07/16  3:20 PM  Result Value Ref Range Status   Specimen Description URINE, RANDOM  Final   Special Requests NONE  Final   Culture >=100,000 COLONIES/mL ENTEROCOCCUS FAECALIS (A)  Final   Report Status 10/10/2016 FINAL  Final   Organism ID,  Bacteria ENTEROCOCCUS FAECALIS (A)  Final      Susceptibility   Enterococcus faecalis - MIC*    AMPICILLIN <=2 SENSITIVE Sensitive     LEVOFLOXACIN 1 SENSITIVE Sensitive     NITROFURANTOIN <=16 SENSITIVE Sensitive     VANCOMYCIN 2 SENSITIVE Sensitive     * >=100,000 COLONIES/mL ENTEROCOCCUS FAECALIS  Blood culture (routine x 2)     Status: None (Preliminary result)   Collection Time: 10/07/16  5:05 PM  Result Value Ref Range Status   Specimen Description BLOOD LEFT ANTECUBITAL  Final   Special Requests BOTTLES DRAWN AEROBIC AND ANAEROBIC 5 CC EACH  Final   Culture   Final    NO GROWTH 3 DAYS Performed at [facility]    Report Status PENDING  Incomplete  Blood culture (routine x 2)     Status:  None (Preliminary result)   Collection Time: 10/07/16  5:05 PM  Result Value Ref Range Status   Specimen Description BLOOD RIGHT ARM  Final   Special Requests BOTTLES DRAWN AEROBIC AND ANAEROBIC 5 CC EACH  Final   Culture   Final    NO GROWTH 3 DAYS Performed at [facility]    Report Status PENDING  Incomplete     Scheduled Meds: . carvedilol  3.125 mg Oral BID WC  . cefTRIAXone (ROCEPHIN)  IV  1 g Intravenous Q24H  . heparin  5,000 Units Subcutaneous Q8H  . insulin aspart  0-15 Units Subcutaneous TID WC  . insulin aspart  0-5 Units Subcutaneous QHS  . lactulose  20 g Oral TID  . rifaximin  550 mg Oral BID  . sodium chloride flush  3 mL Intravenous Q12H  . spironolactone  25 mg Oral Daily  . vitamin B-12  1,000 mcg Oral Daily  . vitamin C  500 mg Oral Daily   Continuous Infusions:  Procedures/Studies: Dg Chest 2 View  Result Date: 10/07/2016 CLINICAL DATA:  Weakness, recent falls EXAM: CHEST  2 VIEW COMPARISON:  03/27/2016 FINDINGS: Cardiac shadow is enlarged. Postsurgical changes are again seen. The lungs are well aerated bilaterally with mild central vascular congestion. No focal infiltrate or sizable effusion is seen. No bony abnormality is noted. IMPRESSION: Mild vascular congestion. Electronically Signed   By: Alcide Clever M.D.   On: 10/07/2016 15:53   Ct Head Wo Contrast  Result Date: 10/09/2016 CLINICAL DATA:  Recent fall.  Unsteady gait. EXAM: CT HEAD WITHOUT CONTRAST TECHNIQUE: Contiguous axial images were obtained from the base of the skull through the vertex without intravenous contrast. COMPARISON:  None. FINDINGS: Brain: Cortical atrophy and chronic microvascular ischemic change are identified. No acute intracranial abnormality including hemorrhage, infarct, mass lesion, mass effect, midline shift or abnormal extra-axial fluid collection. No hydrocephalus or pneumocephalus. Vascular: Extensive atherosclerotic vascular disease is present. Skull: Intact.  No  focal lesion. Sinuses/Orbits: Negative. Other: Scalp calcifications on the left are incidentally noted and likely due to prior infectious or inflammatory process. The patient also has a cystic lesion in the scalp over the left side of the cranium near the vertex most consistent with a sebaceous cyst. IMPRESSION: No acute abnormality. Atrophy and chronic microvascular ischemic change. Atherosclerosis. Electronically Signed   By: Drusilla Kanner M.D.   On: 10/09/2016 15:37   Ct Abdomen Pelvis W Contrast  Result Date: 10/07/2016 CLINICAL DATA:  Status post fall 5 days ago. Increasing weakness over the past 2 weeks. EXAM: CT ABDOMEN AND PELVIS WITH CONTRAST TECHNIQUE: Multidetector CT imaging of the abdomen  and pelvis was performed using the standard protocol following bolus administration of intravenous contrast. CONTRAST:  75 ml ISOVUE-300 IOPAMIDOL (ISOVUE-300) INJECTION 61% COMPARISON:  None. FINDINGS: Lower chest: Trace right pleural effusion is noted. No left pleural effusion or pericardial effusion. Calcific coronary artery disease is seen. Gynecomastia is identified. Mild dependent atelectasis is seen in the right base. Hepatobiliary: The liver is shrunken with a nodular border consistent with cirrhosis. The portal vein is patent with laminar flow on early images seen in the SMV and portal veins. No focal liver lesion. Several small gallstones are identified. Pancreas: Unremarkable. No pancreatic ductal dilatation or surrounding inflammatory changes. Spleen: Normal in size without focal abnormality. Adrenals/Urinary Tract: Small low attenuating lesions in the right kidney cannot be definitively characterized but are likely cysts. Scarring is noted in the upper pole of the right kidney. The adrenal glands appear normal. Urinary bladder is distended but otherwise unremarkable. Stomach/Bowel: Colonic diverticula without diverticulitis noted the stomach and small bowel appear normal. Vascular/Lymphatic:  Aortoiliac atherosclerosis without aneurysm is identified. No lymphadenopathy. Reproductive: Negative.  Penile prosthesis noted. Other: Small volume of abdominal ascites is seen.  No hernia. Musculoskeletal: Scoliosis and multilevel spondylosis are identified. Multiple Schmorl's nodes are seen. No worrisome lesion is identified. IMPRESSION: Cirrhotic liver with associated small volume of abdominal ascites. Trace right pleural effusion. Cardiomegaly. Calcific aortic and coronary atherosclerosis. Gallstones without cholecystitis. Diverticulosis without diverticulitis. Gynecomastia. Electronically Signed   By: Drusilla Kanner M.D.   On: 10/07/2016 19:00    Artesha Wemhoff, DO  [facility] Pager [phone]  If 7PM-7AM, please contact night-coverage [email or web address] Password [email or web address] 10/10/2016, 5:51 PM   LOS: 2 days

## 2016-10-11 ENCOUNTER — Ambulatory Visit: Payer: PPO | Admitting: Family Medicine

## 2016-10-11 DIAGNOSIS — E118 Type 2 diabetes mellitus with unspecified complications: Secondary | ICD-10-CM

## 2016-10-11 DIAGNOSIS — I480 Paroxysmal atrial fibrillation: Secondary | ICD-10-CM

## 2016-10-11 LAB — HEPATIC FUNCTION PANEL
ALK PHOS: 126 U/L — AB (ref 25–125)
ALT: 20 U/L (ref 10–40)
ALT: 18 U/L (ref 10–40)
AST: 32 U/L (ref 14–40)
AST: 32 U/L (ref 14–40)
Alkaline Phosphatase: 138 U/L — AB (ref 25–125)
BILIRUBIN, TOTAL: 3.5 mg/dL
Bilirubin, Total: 3.3 mg/dL

## 2016-10-11 LAB — COMPREHENSIVE METABOLIC PANEL
ALBUMIN: 3 g/dL — AB (ref 3.5–5.0)
ALT: 20 U/L (ref 17–63)
AST: 32 U/L (ref 15–41)
Alkaline Phosphatase: 138 U/L — ABNORMAL HIGH (ref 38–126)
Anion gap: 12 (ref 5–15)
BUN: 94 mg/dL — ABNORMAL HIGH (ref 6–20)
CHLORIDE: 92 mmol/L — AB (ref 101–111)
CO2: 22 mmol/L (ref 22–32)
Calcium: 9.6 mg/dL (ref 8.9–10.3)
Creatinine, Ser: 1.57 mg/dL — ABNORMAL HIGH (ref 0.61–1.24)
GFR calc Af Amer: 48 mL/min — ABNORMAL LOW (ref >60)
GFR calc non Af Amer: 41 mL/min — ABNORMAL LOW (ref >60)
GLUCOSE: 217 mg/dL — AB (ref 65–99)
POTASSIUM: 3.5 mmol/L (ref 3.5–5.1)
SODIUM: 126 mmol/L — AB (ref 135–145)
Total Bilirubin: 3.5 mg/dL — ABNORMAL HIGH (ref 0.3–1.2)
Total Protein: 8 g/dL (ref 6.5–8.1)

## 2016-10-11 LAB — CBC WITH DIFFERENTIAL/PLATELET
BASOS PCT: 0 %
Basophils Absolute: 0 10*3/uL (ref 0.0–0.1)
EOS ABS: 0.3 10*3/uL (ref 0.0–0.7)
Eosinophils Relative: 4 %
HEMATOCRIT: 36.3 % — AB (ref 39.0–52.0)
HEMOGLOBIN: 13 g/dL (ref 13.0–17.0)
LYMPHS ABS: 0.5 10*3/uL — AB (ref 0.7–4.0)
Lymphocytes Relative: 8 %
MCH: 30.7 pg (ref 26.0–34.0)
MCHC: 35.8 g/dL (ref 30.0–36.0)
MCV: 85.8 fL (ref 78.0–100.0)
MONO ABS: 0.4 10*3/uL (ref 0.1–1.0)
MONOS PCT: 5 %
NEUTROS PCT: 83 %
Neutro Abs: 6 10*3/uL (ref 1.7–7.7)
Platelets: 138 10*3/uL — ABNORMAL LOW (ref 150–400)
RBC: 4.23 MIL/uL (ref 4.22–5.81)
RDW: 15.8 % — AB (ref 11.5–15.5)
WBC: 7.2 10*3/uL (ref 4.0–10.5)

## 2016-10-11 LAB — GLUCOSE, CAPILLARY
GLUCOSE-CAPILLARY: 206 mg/dL — AB (ref 65–99)
GLUCOSE-CAPILLARY: 178 mg/dL — AB (ref 65–99)
GLUCOSE-CAPILLARY: 136 mg/dL — AB (ref 65–99)
Glucose-Capillary: 205 mg/dL — ABNORMAL HIGH (ref 65–99)

## 2016-10-11 LAB — TSH: TSH: 10.172 u[IU]/mL — AB (ref 0.350–4.500)

## 2016-10-11 LAB — AMMONIA: AMMONIA: 97 umol/L — AB (ref 9–35)

## 2016-10-11 LAB — CBC AND DIFFERENTIAL
HCT: 36 % — AB (ref 41–53)
Hemoglobin: 13 g/dL — AB (ref 13.5–17.5)
PLATELETS: 138 10*3/uL — AB (ref 150–399)
WBC: 7.2 10*3/mL

## 2016-10-11 MED ORDER — INSULIN ASPART PROT & ASPART (70-30 MIX) 100 UNIT/ML ~~LOC~~ SUSP
10.0000 [IU] | Freq: Two times a day (BID) | SUBCUTANEOUS | Status: DC
  Administered 2016-10-11 – 2016-10-12 (×3): 10 [IU] via SUBCUTANEOUS
  Filled 2016-10-11: qty 10

## 2016-10-11 NOTE — Progress Notes (Addendum)
Inpatient Diabetes Program Recommendations  AACE/ADA: New Consensus Statement on Inpatient Glycemic Control (2015)  Target Ranges:  Prepandial:   less than 140 mg/dL      Peak postprandial:   less than 180 mg/dL (1-2 hours)      Critically ill patients:  140 - 180 mg/dL   Results for [name], [name] (MRN [id number]) as of 10/11/2016 08:00  Ref. Range 10/10/2016 07:45 10/10/2016 12:02 10/10/2016 12:45 10/10/2016 16:47 10/10/2016 22:02  Glucose-Capillary Latest Ref Range: 65 - 99 mg/dL 132119 (H) 440176 (H) 102197 (H) 214 (H) 241 (H)   Results for [name], [name] (MRN [id number]) as of 10/11/2016 08:00  Ref. Range 10/11/2016 07:51  Glucose-Capillary Latest Ref Range: 65 - 99 mg/dL 347205 (H)    Home DM Meds: 70/30 Insulin: 0-30 units BID based on Sliding scale  Current Insulin Orders: Novolog Moderate Correction Scale/ SSI (0-15 units) TID AC + HS       MD- Note patient ate 75-100% of meals yesterday (12/21).  Glucose elevated this AM.  Please consider starting a portion of pt's home dose of 70/30 insulin: Could start with 10 units BID with meals (this dose would give pt about 14 units longer acting insulin divided into 2 doses and about 3 units Novolog with each dose)     --Will follow patient during hospitalization--  Ambrose FinlandJeannine Johnston Aunica Dauphinee RN, MSN, CDE Diabetes Coordinator Inpatient Glycemic Control Team Team Pager: [phone] (8a-5p)

## 2016-10-11 NOTE — Progress Notes (Signed)
PROGRESS NOTE    [name]  ZOX:[id number] DOB: 07-26-1941 DOA: 10/07/2016 PCP: [name], MD (Confirm with patient/family/NH records and if not entered, this HAS to be entered at [facility] point of entry. 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 fib not on anticoagulation due to prior history of GI bleed and nonadherence to medications,, CK D stage III, coronary artery disease, hypertension, type 2 diabetes mellitus, umbilical hernia and prior history of PE was brought to the ED by his wife with 5 days of confusion. Patient had an unwitnessed fall at home 5 days back following which he started having increased confusion. Wife also noticed that he was having foul-smelling urine. No fevers or chills, nausea, vomiting, chest pain, shortness of breath, abdominal pain, diarrhea, pain in joints. Patient has been ambulating with difficulty and unsteady. In the ED he was afebrile but confused. Blood work showed normal WBC, sodium of 129, potassium of 3.3, chloride of 90, BUN of 92 and creatinine 1.67. Glucose of 216 and elevated ammonia of 175. He had elevated total bilirubin with normal AST/ALT and elevated alkaline phosphatase. Lactic acid was normal. UA was suggestive of UTI. Chest x-ray was unremarkable. CT of the abdomen and pelvis showed cirrhotic liver with small ascites and right pleural effusion.  Patient admitted for acute metabolic encephalopathy  secondary to UTI and hepatic encephalopathy.   Assessment & Plan:   Principal Problem:   Metabolic encephalopathy Active Problems:   Hepatic encephalopathy (HCC)   CHF (congestive heart failure) (HCC)   DM II (diabetes mellitus, type II), controlled (HCC)   Atrial fibrillation (HCC)   Type 2 diabetes with complication (HCC)   UTI  (urinary tract infection)  Acute metabolic encephalopathy -Secondary to UTI and hepatic encephalopathy.  --Patient with clinical improvement. Urine cultures positive for Enterococcus faecalis which is sensitive to the penicillins, quinolones and vancomycin. IV Rocephin has been transitioned to oral amoxicillin. Continue lactulose and Xifaxan.  -Mental status improving per wife at the bedside--close to baseline  Enterococcus UTI -Patient was on IV Rocephin and subsequently transitioned to oral amoxicillin to complete course of antibiotic treatment for Enterococcus faecalis UTI.  Hepatic encephalopathy -Patient has liver cirrhosis likely secondary to chronic hepatic congestion from the patient's pulmonary hypertension -Ammonia 175 at the time of admission -Repeat ammonia level currently at 97. Patient with some clinical improvement. -Continue lactulose--patient had been refusing intermittently -Discussed with the patient importance of taking lactulose Continue Xifaxan.  Active Problems: Cirrhosis of liver -Secondary to chronic hepatic congestion from pulmonary hypertension and likely cor pulmonale -Hepatitis viral serologies negative -Continue spironolactone, Coreg, Xifaxan, lactulose  Chronic systolic congestive heart failure -Euvolemic clinically -Admission weight 135 pounds -holding lasix due to increasing serum creatinine -Continue carvedilol and spironolactone -Current weight is 133 pounds.  Acute on chronic renal failure--CKD stage III -Baseline creatinine 1.4-1.6 -Hold furosemide -give NS x 500 cc yesterday, as he appearred dry clinically  DM II (diabetes mellitus, type II), controlled (HCC) -07/11/2016, A1c 7.2. CBGs have ranged from 178-206. Start NovoLog 70/3010 units twice daily. -Continue NovoLog sliding scale  Atrial fibrillation (HCC) Rate controlled.  -Continue beta blocker.  -Not an anticoagulation due to history of? GI bleed and nonadherence to  medication.  Hyponatremia -Chronic ranging 129-132, likely due to CHF and cirrhosis. Continue spironolactone. Sodium level stable at 126. Patient does not look significantly  volume overloaded on examination. Blood pressure is borderline. Hold Lasix.  Recent fall with unsteady gait. Ct head negative. Needs skilled nursing facility.  Hypokalemia Repleted.    DVT prophylaxis: Heparin Code Status: Full Family Communication: Updated patient and wife at bedside. Disposition Plan: SNF when medically stable.   Consultants:   None  Procedures:   CT head 10/09/2016,   CT abdomen and pelvis 10/07/2016  Chest x-ray 10/07/2016    Antimicrobials:   Amoxicillin 10/10/2016  IV Rocephin 10/07/2016>>>>> 10/10/2016     Subjective: Patient sitting up at the side of the bed getting ready to eat. Patient alert to self place and time. Feels generally weak however some improvement.  Objective: Vitals:   10/11/16 0750 10/11/16 0900 10/11/16 1429 10/11/16 1657  BP: 119/76  116/67 119/67  Pulse: 61  67 66  Resp:   17   Temp:   98.2 F (36.8 C)   TempSrc:   Oral   SpO2:   96%   Weight:  60.4 kg (133 lb 1.6 oz)    Height:        Intake/Output Summary (Last 24 hours) at 10/11/16 1847 Last data filed at 10/11/16 1346  Gross per 24 hour  Intake              603 ml  Output              225 ml  Net              378 ml   Filed Weights   10/07/16 1413 10/11/16 0900  Weight: 61.2 kg (135 lb) 60.4 kg (133 lb 1.6 oz)    Examination:  General exam: Appears calm and comfortable  Respiratory system: Clear to auscultation. Respiratory effort normal. Cardiovascular system: S1 & S2 heard, RRR. No JVD, murmurs, rubs, gallops or clicks. No pedal edema. Gastrointestinal system: Abdomen is nondistended, soft and nontender. No organomegaly or masses felt. Umbilical hernia, is reproducible. Normal bowel sounds heard. Central nervous system: Alert and oriented. No focal neurological  deficits. Extremities: Symmetric 5 x 5 power. Skin: No rashes, lesions or ulcers Psychiatry: Judgement and insight appear normal. Mood & affect appropriate.     Data Reviewed: I have personally reviewed following labs and imaging studies  CBC:  Recent Labs Lab 10/07/16 1429 10/08/16 0341 10/11/16 0838  WBC 5.8 5.3 7.2  NEUTROABS  --   --  6.0  HGB 13.7 13.2 13.0  HCT 37.5* 38.2* 36.3*  MCV 84.8 86.2 85.8  PLT 136* 139* 138*   Basic Metabolic Panel:  Recent Labs Lab 10/07/16 1429 10/08/16 0341 10/09/16 0355 10/10/16 0411 10/11/16 0414  NA 129* 130* 126* 126* 126*  K 3.3* 3.5 4.2 3.5 3.5  CL 90* 93* 90* 92* 92*  CO2 27 24 24  20* 22  GLUCOSE 216* 165* 200* 123* 217*  BUN 92* 94* 93* 90* 94*  CREATININE 1.67* 1.50* 2.02* 1.84* 1.57*  CALCIUM 9.4 9.4 9.6 9.4 9.6  MG  --   --   --  2.1  --    GFR: Estimated Creatinine Clearance: 34.2 mL/min (by C-G formula based on SCr of 1.57 mg/dL (H)). Liver Function Tests:  Recent Labs Lab 10/07/16 1429 10/08/16 0341 10/11/16 0414  AST 39 34 32  ALT 26 22 20   ALKPHOS 160* 144* 138*  BILITOT 5.4* 4.9* 3.5*  PROT 8.1 8.2* 8.0  ALBUMIN 3.1* 3.3* 3.0*    Recent Labs Lab 10/07/16 1429  LIPASE 47    Recent Labs  Lab 10/07/16 1521 10/08/16 0341 10/10/16 0411 10/11/16 0414  AMMONIA 175* 153* 87* 97*   Coagulation Profile:  Recent Labs Lab 10/07/16 1429  INR 0.96   Cardiac Enzymes: No results for input(s): CKTOTAL, CKMB, CKMBINDEX, TROPONINI in the last 168 hours. BNP (last 3 results) No results for input(s): PROBNP in the last 8760 hours. HbA1C: No results for input(s): HGBA1C in the last 72 hours. CBG:  Recent Labs Lab 10/10/16 1647 10/10/16 2202 10/11/16 0751 10/11/16 1222 10/11/16 1656  GLUCAP 214* 241* 205* 206* 178*   Lipid Profile: No results for input(s): CHOL, HDL, LDLCALC, TRIG, CHOLHDL, LDLDIRECT in the last 72 hours. Thyroid Function Tests:  Recent Labs  10/11/16 0838  TSH 10.172*    Anemia Panel: No results for input(s): VITAMINB12, FOLATE, FERRITIN, TIBC, IRON, RETICCTPCT in the last 72 hours. Sepsis Labs:  Recent Labs Lab 10/07/16 1717  LATICACIDVEN 1.29    Recent Results (from the past 240 hour(s))  Culture, Urine     Status: Abnormal   Collection Time: 10/07/16  3:20 PM  Result Value Ref Range Status   Specimen Description URINE, RANDOM  Final   Special Requests NONE  Final   Culture >=100,000 COLONIES/mL ENTEROCOCCUS FAECALIS (A)  Final   Report Status 10/10/2016 FINAL  Final   Organism ID, Bacteria ENTEROCOCCUS FAECALIS (A)  Final      Susceptibility   Enterococcus faecalis - MIC*    AMPICILLIN <=2 SENSITIVE Sensitive     LEVOFLOXACIN 1 SENSITIVE Sensitive     NITROFURANTOIN <=16 SENSITIVE Sensitive     VANCOMYCIN 2 SENSITIVE Sensitive     * >=100,000 COLONIES/mL ENTEROCOCCUS FAECALIS  Blood culture (routine x 2)     Status: None (Preliminary result)   Collection Time: 10/07/16  5:05 PM  Result Value Ref Range Status   Specimen Description BLOOD LEFT ANTECUBITAL  Final   Special Requests BOTTLES DRAWN AEROBIC AND ANAEROBIC 5 CC EACH  Final   Culture   Final    NO GROWTH 4 DAYS [facility]    Report Status PENDING  Incomplete  Blood culture (routine x 2)     Status: None (Preliminary result)   Collection Time: 10/07/16  5:05 PM  Result Value Ref Range Status   Specimen Description BLOOD RIGHT ARM  Final   Special Requests BOTTLES DRAWN AEROBIC AND ANAEROBIC 5 CC EACH  Final   Culture   Final    NO GROWTH 4 DAYS [facility]    Report Status PENDING  Incomplete         Radiology Studies: No results found.      Scheduled Meds: . amoxicillin  500 mg Oral Q8H  . carvedilol  3.125 mg Oral BID WC  . heparin  5,000 Units Subcutaneous Q8H  . insulin aspart  0-15 Units Subcutaneous TID WC  . insulin aspart  0-5 Units Subcutaneous QHS  . insulin aspart protamine- aspart  10 Units  Subcutaneous BID WC  . lactulose  20 g Oral TID  . rifaximin  550 mg Oral BID  . sodium chloride flush  3 mL Intravenous Q12H  . spironolactone  25 mg Oral Daily  . vitamin B-12  1,000 mcg Oral Daily  . vitamin C  500 mg Oral Daily   Continuous Infusions:   LOS: 3 days    Time spent: 40 mins    Kamiyah Kindel, MD [facility] Pager [phone]  If 7PM-7AM, please contact night-coverage [email or web address] Password [email or web address] 10/11/2016,  6:47 PM

## 2016-10-11 NOTE — Progress Notes (Signed)
Physical Therapy Treatment Patient Details Name: [name] MRN: [id number] DOB: 1941/02/15 Today's Date: 10/11/2016    History of Present Illness [name] is a 75 y.o. male with medical history significant of CHF presumed diastolic, Afib presenting with 5 days of confusion. Pt had fall around that time. Wife reports that his confusion has persisted and is getting worse. It has been gradual. Also associate with foul smelling urine. Nothing she is aware of makes it better or worse. History is obtained from wife since patient is confused and is unable to provide history    PT Comments    Pt made significant progress with mobility today, he ambulated 150' with min A and RW.   Follow Up Recommendations  SNF;Supervision/Assistance - 24 hour (unless he has 24/7 assistance)     Equipment Recommendations  Rolling walker with 5" wheels (unsure)    Recommendations for Other Services       Precautions / Restrictions Precautions Precautions: Fall Restrictions Weight Bearing Restrictions: No    Mobility  Bed Mobility Overal bed mobility: Modified Independent Bed Mobility: Supine to Sit     Supine to sit: Modified independent (Device/Increase time)     General bed mobility comments: with rail  Transfers Overall transfer level: Needs assistance Equipment used: Rolling walker (2 wheeled) Transfers: Sit to/from Stand Sit to Stand: Min assist         General transfer comment: min A to rise, VCs hand placement  Ambulation/Gait Ambulation/Gait assistance: Min assist Ambulation Distance (Feet): 150 Feet Assistive device: Rolling walker (2 wheeled) Gait Pattern/deviations: Step-through pattern   Gait velocity interpretation: at or above normal speed for age/gender General Gait Details: min A to steer & maneuver RW esp during turns, VCs for positioning in RW   Stairs            Wheelchair Mobility    Modified Rankin (Stroke Patients Only)       Balance  Overall balance assessment: History of Falls;Needs assistance Sitting-balance support: Feet supported;Bilateral upper extremity supported Sitting balance-Leahy Scale: Fair     Standing balance support: Bilateral upper extremity supported;During functional activity Standing balance-Leahy Scale: Poor                      Cognition Arousal/Alertness: Awake/alert Behavior During Therapy: WFL for tasks assessed/performed Overall Cognitive Status: Within Functional Limits for tasks assessed                      Exercises      General Comments        Pertinent Vitals/Pain Pain Assessment: No/denies pain    Home Living                      Prior Function            PT Goals (current goals can now be found in the care plan section) Acute Rehab PT Goals Patient Stated Goal: agreed to walk PT Goal Formulation: With patient Time For Goal Achievement: 10/24/16 Potential to Achieve Goals: Fair Progress towards PT goals: Progressing toward goals    Frequency    Min 3X/week      PT Plan Current plan remains appropriate    Co-evaluation             End of Session Equipment Utilized During Treatment: Gait belt Activity Tolerance: Patient tolerated treatment well Patient left: in chair;with call bell/phone within reach;with chair alarm set     Time:  [id number] PT Time Calculation (min) (ACUTE ONLY): 11 min  Charges:  $Gait Training: 8-22 mins                    G Codes:      Tamala SerUhlenberg, Kaicee Scarpino Kistler 10/11/2016, 12:20 PM [phone]

## 2016-10-12 DIAGNOSIS — W19XXXD Unspecified fall, subsequent encounter: Secondary | ICD-10-CM

## 2016-10-12 DIAGNOSIS — W19XXXA Unspecified fall, initial encounter: Secondary | ICD-10-CM

## 2016-10-12 LAB — COMPREHENSIVE METABOLIC PANEL
ALBUMIN: 3 g/dL — AB (ref 3.5–5.0)
ALK PHOS: 126 U/L (ref 38–126)
ALT: 18 U/L (ref 17–63)
AST: 32 U/L (ref 15–41)
Anion gap: 10 (ref 5–15)
BILIRUBIN TOTAL: 3.3 mg/dL — AB (ref 0.3–1.2)
BUN: 80 mg/dL — AB (ref 6–20)
CALCIUM: 9.2 mg/dL (ref 8.9–10.3)
CO2: 23 mmol/L (ref 22–32)
CREATININE: 1.47 mg/dL — AB (ref 0.61–1.24)
Chloride: 96 mmol/L — ABNORMAL LOW (ref 101–111)
GFR calc Af Amer: 52 mL/min — ABNORMAL LOW (ref >60)
GFR, EST NON AFRICAN AMERICAN: 45 mL/min — AB (ref >60)
GLUCOSE: 127 mg/dL — AB (ref 65–99)
POTASSIUM: 3.5 mmol/L (ref 3.5–5.1)
Sodium: 129 mmol/L — ABNORMAL LOW (ref 135–145)
TOTAL PROTEIN: 7.6 g/dL (ref 6.5–8.1)

## 2016-10-12 LAB — CBC
HEMATOCRIT: 34.8 % — AB (ref 39.0–52.0)
Hemoglobin: 12.1 g/dL — ABNORMAL LOW (ref 13.0–17.0)
MCH: 30.3 pg (ref 26.0–34.0)
MCHC: 34.8 g/dL (ref 30.0–36.0)
MCV: 87.2 fL (ref 78.0–100.0)
PLATELETS: 132 10*3/uL — AB (ref 150–400)
RBC: 3.99 MIL/uL — AB (ref 4.22–5.81)
RDW: 15.9 % — ABNORMAL HIGH (ref 11.5–15.5)
WBC: 7 10*3/uL (ref 4.0–10.5)

## 2016-10-12 LAB — GLUCOSE, CAPILLARY
GLUCOSE-CAPILLARY: 207 mg/dL — AB (ref 65–99)
GLUCOSE-CAPILLARY: 224 mg/dL — AB (ref 65–99)
Glucose-Capillary: 159 mg/dL — ABNORMAL HIGH (ref 65–99)
Glucose-Capillary: 141 mg/dL — ABNORMAL HIGH (ref 65–99)

## 2016-10-12 LAB — CBC AND DIFFERENTIAL
HCT: 35 % — AB (ref 41–53)
HEMOGLOBIN: 12.1 g/dL — AB (ref 13.5–17.5)
PLATELETS: 132 10*3/uL — AB (ref 150–399)
WBC: 7 10^3/mL

## 2016-10-12 LAB — CULTURE, BLOOD (ROUTINE X 2)
Culture: NO GROWTH
Culture: NO GROWTH

## 2016-10-12 LAB — AMMONIA: Ammonia: 80 umol/L — ABNORMAL HIGH (ref 9–35)

## 2016-10-12 LAB — HEPATIC FUNCTION PANEL: BILIRUBIN, TOTAL: 3.3 mg/dL

## 2016-10-12 MED ORDER — INSULIN ASPART PROT & ASPART (70-30 MIX) 100 UNIT/ML ~~LOC~~ SUSP
14.0000 [IU] | Freq: Two times a day (BID) | SUBCUTANEOUS | Status: DC
  Administered 2016-10-12 – 2016-10-14 (×5): 14 [IU] via SUBCUTANEOUS
  Filled 2016-10-12: qty 10

## 2016-10-12 NOTE — Progress Notes (Signed)
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 fib not on anticoagulation due to prior history of GI bleed and nonadherence to medications,, CK D stage III, coronary artery disease, hypertension, type 2 diabetes mellitus, umbilical hernia and prior history of PE was brought to the ED by his wife with 5 days of confusion. Patient had an unwitnessed fall at home 5 days back following which he started having increased confusion. Wife also noticed that he was having foul-smelling urine. No fevers or chills, nausea, vomiting, chest pain, shortness of breath, abdominal pain, diarrhea, pain in joints. Patient has been ambulating with difficulty and unsteady. In the ED he was afebrile but confused. Blood work showed normal WBC, sodium of 129, potassium of 3.3, chloride of 90, BUN of 92 and creatinine 1.67. Glucose of 216 and elevated ammonia of 175. He had elevated total bilirubin with normal AST/ALT and elevated alkaline phosphatase. Lactic acid was normal. UA was suggestive of UTI. Chest x-ray was unremarkable. CT of the abdomen and pelvis showed cirrhotic liver with small ascites and right pleural effusion.  Patient admitted for acute metabolic encephalopathy  secondary to UTI and hepatic encephalopathy.   Assessment & Plan:   Principal Problem:   Metabolic encephalopathy Active Problems:   Hepatic encephalopathy (HCC)   CHF (congestive heart failure) (HCC)   DM II (diabetes mellitus, type II), controlled (HCC)   Atrial fibrillation (HCC)   Type 2 diabetes with complication (HCC)   UTI (urinary tract infection)  Acute metabolic encephalopathy -Secondary to UTI and hepatic encephalopathy.  --Patient with clinical  improvement. Urine cultures positive for Enterococcus faecalis which is sensitive to the penicillins, quinolones and vancomycin. IV Rocephin has been transitioned to oral amoxicillin. Continue lactulose and Xifaxan.  -Mental status improving per wife at the bedside--close to baseline  Enterococcus UTI -Patient was on IV Rocephin and subsequently transitioned to oral amoxicillin to complete course of antibiotic treatment for Enterococcus faecalis UTI.  Hepatic encephalopathy -Patient has liver cirrhosis likely secondary to chronic hepatic congestion from the patient's pulmonary hypertension -Ammonia 175 at the time of admission -Repeat ammonia level currently at 80. Patient with some clinical improvement. -Continue lactulose--patient had been refusing intermittently -Discussed with the patient importance of taking lactulose Continue Xifaxan.  Active Problems: Cirrhosis of liver -Secondary to chronic hepatic congestion from pulmonary hypertension and likely cor pulmonale -Hepatitis viral serologies negative -Continue spironolactone, Coreg, Xifaxan, lactulose  Chronic systolic congestive heart failure -Euvolemic clinically -Admission weight 135 pounds -holding lasix due to increasing serum creatinine -Continue carvedilol and spironolactone -Current weight is 133 pounds.  Acute on chronic renal failure--CKD stage III -Baseline creatinine 1.4-1.6 -Hold furosemide -give NS x 500 cc, as he appearred dry clinically  DM II (diabetes mellitus, type II), controlled (HCC) -07/11/2016, A1c 7.2. CBGs have ranged from 159-224. Increase NovoLog 70/30 to14 units twice daily. -Continue NovoLog sliding scale  Atrial fibrillation (HCC) Rate controlled.  -Continue beta blocker.  -Not an anticoagulation due to history of? GI bleed and nonadherence to medication.  Hyponatremia -Chronic ranging 129-132, likely due to CHF and cirrhosis. Continue spironolactone. Sodium level stable now  have improved and currently at 29 at baseline.  Patient does not look significantly volume overloaded on examination. Blood pressure is borderline. Hold Lasix.  Recent fall with  unsteady gait. Ct head negative. Needs skilled nursing facility.  Hypokalemia Repleted.    DVT prophylaxis: Heparin Code Status: Full Family Communication: Updated patient. No family at bedside. Disposition Plan: SNF when medically stable hopefully tomorrow.   Consultants:   None  Procedures:   CT head 10/09/2016,   CT abdomen and pelvis 10/07/2016  Chest x-ray 10/07/2016    Antimicrobials:   Amoxicillin 10/10/2016  IV Rocephin 10/07/2016>>>>> 10/10/2016     Subjective: Patient sitting up in chair. Patient is alert to self and place and time. Patient denies any chest pain. No shortness of breath. No dizziness. Feeling better.   Objective: Vitals:   10/11/16 1429 10/11/16 1657 10/11/16 2210 10/12/16 0455  BP: 116/67 119/67 112/69 (!) 105/59  Pulse: 67 66 70 (!) 57  Resp: 17  20 20   Temp: 98.2 F (36.8 C)  97.7 F (36.5 C) 97.7 F (36.5 C)  TempSrc: Oral  Oral Oral  SpO2: 96%  98% 99%  Weight:      Height:        Intake/Output Summary (Last 24 hours) at 10/12/16 1116 Last data filed at 10/12/16 16100823  Gross per 24 hour  Intake              710 ml  Output                0 ml  Net              710 ml   Filed Weights   10/07/16 1413 10/11/16 0900  Weight: 61.2 kg (135 lb) 60.4 kg (133 lb 1.6 oz)    Examination:  General exam: Appears calm and comfortable  Respiratory system: Clear to auscultation. Respiratory effort normal. Cardiovascular system: S1 & S2 heard, RRR. No JVD, murmurs, rubs, gallops or clicks. No pedal edema. Gastrointestinal system: Abdomen is nondistended, soft and nontender. No organomegaly or masses felt. Umbilical hernia, is reproducible. Normal bowel sounds heard. Central nervous system: Alert and oriented. No focal neurological  deficits. Extremities: Symmetric 5 x 5 power. Skin: No rashes, lesions or ulcers Psychiatry: Judgement and insight appear normal. Mood & affect appropriate.     Data Reviewed: I have personally reviewed following labs and imaging studies  CBC:  Recent Labs Lab 10/07/16 1429 10/08/16 0341 10/11/16 0838 10/12/16 0454  WBC 5.8 5.3 7.2 7.0  NEUTROABS  --   --  6.0  --   HGB 13.7 13.2 13.0 12.1*  HCT 37.5* 38.2* 36.3* 34.8*  MCV 84.8 86.2 85.8 87.2  PLT 136* 139* 138* 132*   Basic Metabolic Panel:  Recent Labs Lab 10/08/16 0341 10/09/16 0355 10/10/16 0411 10/11/16 0414 10/12/16 0454  NA 130* 126* 126* 126* 129*  K 3.5 4.2 3.5 3.5 3.5  CL 93* 90* 92* 92* 96*  CO2 24 24 20* 22 23  GLUCOSE 165* 200* 123* 217* 127*  BUN 94* 93* 90* 94* 80*  CREATININE 1.50* 2.02* 1.84* 1.57* 1.47*  CALCIUM 9.4 9.6 9.4 9.6 9.2  MG  --   --  2.1  --   --    GFR: Estimated Creatinine Clearance: 36.5 mL/min (by C-G formula based on SCr of 1.47 mg/dL (H)). Liver Function Tests:  Recent Labs Lab 10/07/16 1429 10/08/16 0341 10/11/16 0414 10/12/16 0454  AST 39 34 32 32  ALT 26 22 20 18   ALKPHOS 160* 144* 138* 126  BILITOT 5.4* 4.9* 3.5* 3.3*  PROT 8.1 8.2* 8.0 7.6  ALBUMIN 3.1* 3.3* 3.0* 3.0*  Recent Labs Lab 10/07/16 1429  LIPASE 47    Recent Labs Lab 10/07/16 1521 10/08/16 0341 10/10/16 0411 10/11/16 0414 10/12/16 0454  AMMONIA 175* 153* 87* 97* 80*   Coagulation Profile:  Recent Labs Lab 10/07/16 1429  INR 0.96   Cardiac Enzymes: No results for input(s): CKTOTAL, CKMB, CKMBINDEX, TROPONINI in the last 168 hours. BNP (last 3 results) No results for input(s): PROBNP in the last 8760 hours. HbA1C: No results for input(s): HGBA1C in the last 72 hours. CBG:  Recent Labs Lab 10/11/16 0751 10/11/16 1222 10/11/16 1656 10/11/16 2203 10/12/16 0745  GLUCAP 205* 206* 178* 136* 207*   Lipid Profile: No results for input(s): CHOL, HDL, LDLCALC, TRIG, CHOLHDL,  LDLDIRECT in the last 72 hours. Thyroid Function Tests:  Recent Labs  10/11/16 0838  TSH 10.172*   Anemia Panel: No results for input(s): VITAMINB12, FOLATE, FERRITIN, TIBC, IRON, RETICCTPCT in the last 72 hours. Sepsis Labs:  Recent Labs Lab 10/07/16 1717  LATICACIDVEN 1.29    Recent Results (from the past 240 hour(s))  Culture, Urine     Status: Abnormal   Collection Time: 10/07/16  3:20 PM  Result Value Ref Range Status   Specimen Description URINE, RANDOM  Final   Special Requests NONE  Final   Culture >=100,000 COLONIES/mL ENTEROCOCCUS FAECALIS (A)  Final   Report Status 10/10/2016 FINAL  Final   Organism ID, Bacteria ENTEROCOCCUS FAECALIS (A)  Final      Susceptibility   Enterococcus faecalis - MIC*    AMPICILLIN <=2 SENSITIVE Sensitive     LEVOFLOXACIN 1 SENSITIVE Sensitive     NITROFURANTOIN <=16 SENSITIVE Sensitive     VANCOMYCIN 2 SENSITIVE Sensitive     * >=100,000 COLONIES/mL ENTEROCOCCUS FAECALIS  Blood culture (routine x 2)     Status: None (Preliminary result)   Collection Time: 10/07/16  5:05 PM  Result Value Ref Range Status   Specimen Description BLOOD LEFT ANTECUBITAL  Final   Special Requests BOTTLES DRAWN AEROBIC AND ANAEROBIC 5 CC EACH  Final   Culture   Final    NO GROWTH 4 DAYS Performed at [facility]    Report Status PENDING  Incomplete  Blood culture (routine x 2)     Status: None (Preliminary result)   Collection Time: 10/07/16  5:05 PM  Result Value Ref Range Status   Specimen Description BLOOD RIGHT ARM  Final   Special Requests BOTTLES DRAWN AEROBIC AND ANAEROBIC 5 CC EACH  Final   Culture   Final    NO GROWTH 4 DAYS Performed at [facility]    Report Status PENDING  Incomplete         Radiology Studies: No results found.      Scheduled Meds: . amoxicillin  500 mg Oral Q8H  . carvedilol  3.125 mg Oral BID WC  . heparin  5,000 Units Subcutaneous Q8H  . insulin aspart  0-15 Units Subcutaneous TID WC   . insulin aspart  0-5 Units Subcutaneous QHS  . insulin aspart protamine- aspart  10 Units Subcutaneous BID WC  . lactulose  20 g Oral TID  . rifaximin  550 mg Oral BID  . sodium chloride flush  3 mL Intravenous Q12H  . spironolactone  25 mg Oral Daily  . vitamin B-12  1,000 mcg Oral Daily  . vitamin C  500 mg Oral Daily   Continuous Infusions:   LOS: 4 days    Time spent: 40 mins    THOMPSON,DANIEL,  MD [facility] Pager [phone]  If 7PM-7AM, please contact night-coverage [email or web address] Password [email or web address] 10/12/2016, 11:16 AM

## 2016-10-13 LAB — GLUCOSE, CAPILLARY
GLUCOSE-CAPILLARY: 161 mg/dL — AB (ref 65–99)
Glucose-Capillary: 126 mg/dL — ABNORMAL HIGH (ref 65–99)
Glucose-Capillary: 105 mg/dL — ABNORMAL HIGH (ref 65–99)
Glucose-Capillary: 139 mg/dL — ABNORMAL HIGH (ref 65–99)

## 2016-10-13 NOTE — Progress Notes (Signed)
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 fib not on anticoagulation due to prior history of GI bleed and nonadherence to medications,, CK D stage III, coronary artery disease, hypertension, type 2 diabetes mellitus, umbilical hernia and prior history of PE was brought to the ED by his wife with 5 days of confusion. Patient had an unwitnessed fall at home 5 days back following which he started having increased confusion. Wife also noticed that he was having foul-smelling urine. No fevers or chills, nausea, vomiting, chest pain, shortness of breath, abdominal pain, diarrhea, pain in joints. Patient has been ambulating with difficulty and unsteady. In the ED he was afebrile but confused. Blood work showed normal WBC, sodium of 129, potassium of 3.3, chloride of 90, BUN of 92 and creatinine 1.67. Glucose of 216 and elevated ammonia of 175. He had elevated total bilirubin with normal AST/ALT and elevated alkaline phosphatase. Lactic acid was normal. UA was suggestive of UTI. Chest x-ray was unremarkable. CT of the abdomen and pelvis showed cirrhotic liver with small ascites and right pleural effusion.  Patient admitted for acute metabolic encephalopathy  secondary to UTI and hepatic encephalopathy.   Assessment & Plan:   Principal Problem:   Metabolic encephalopathy Active Problems:   Hepatic encephalopathy (HCC)   CHF (congestive heart failure) (HCC)   DM II (diabetes mellitus, type II), controlled (HCC)   Atrial fibrillation (HCC)   Type 2 diabetes with complication (HCC)   UTI (urinary tract infection)   Fall  Acute metabolic encephalopathy -Secondary to UTI and hepatic encephalopathy.  --Patient with  clinical improvement. Urine cultures positive for Enterococcus faecalis which is sensitive to the penicillins, quinolones and vancomycin. IV Rocephin has been transitioned to oral amoxicillin. Continue lactulose and Xifaxan.  -Mental status improving per wife at the bedside--close to baseline  Enterococcus UTI -Patient was on IV Rocephin and subsequently transitioned to oral amoxicillin to complete course of antibiotic treatment for Enterococcus faecalis UTI.  Hepatic encephalopathy -Patient has liver cirrhosis likely secondary to chronic hepatic congestion from the patient's pulmonary hypertension -Ammonia 175 at the time of admission -Repeat ammonia level at 80 on 10/12/2016. Patient with clinical improvement. -Continue lactulose--patient had been refusing intermittently -Discussed with the patient importance of taking lactulose Continue Xifaxan.  Active Problems: Cirrhosis of liver -Secondary to chronic hepatic congestion from pulmonary hypertension and likely cor pulmonale -Hepatitis viral serologies negative -Continue spironolactone, Coreg, Xifaxan, lactulose  Chronic systolic congestive heart failure -Euvolemic clinically -Admission weight 135 pounds -holding lasix due to increasing serum creatinine -Continue carvedilol and spironolactone -Current weight is 133 pounds.  Acute on chronic renal failure--CKD stage III -Baseline creatinine 1.4-1.6 -Holding furosemide -given NS x 500 cc, as he appearred dry clinically  DM II (diabetes mellitus, type II), controlled (HCC) -07/11/2016, A1c 7.2. CBGs have ranged from 105-139. Increased NovoLog 70/30 to14 units twice daily. -Continue NovoLog sliding scale  Atrial fibrillation (HCC) Rate controlled.  -Continue beta blocker.  -Not an anticoagulation due to history of? GI bleed and nonadherence to medication.  Hyponatremia -Chronic ranging 129-132, likely due to CHF and cirrhosis. Continue spironolactone. Sodium level  stable now have improved and currently at 29 at baseline.  Patient does not look significantly volume overloaded on examination. Blood pressure is borderline. Holding Lasix.  Recent fall with unsteady gait. Ct head negative. Needs skilled nursing facility.  Hypokalemia Repleted.    DVT prophylaxis: Heparin Code Status: Full Family Communication: Updated patient. Updated wife over the telephone. Disposition Plan: SNF when bed available.    Consultants:   None  Procedures:   CT head 10/09/2016,   CT abdomen and pelvis 10/07/2016  Chest x-ray 10/07/2016    Antimicrobials:   Amoxicillin 10/10/2016  IV Rocephin 10/07/2016>>>>> 10/10/2016     Subjective: Patient laying down however easily arousable. Patient is alert to self and place and time. Patient denies any chest pain. No shortness of breath. No dizziness. Feeling better.   Objective: Vitals:   10/12/16 0455 10/12/16 1322 10/12/16 2056 10/13/16 0538  BP: (!) 105/59 120/70 115/69 107/65  Pulse: (!) 57 70 73 79  Resp: 20 16 18 16   Temp: 97.7 F (36.5 C) 97.6 F (36.4 C) 97.9 F (36.6 C) 97.9 F (36.6 C)  TempSrc: Oral Oral Oral Oral  SpO2: 99% 97% 96% 96%  Weight:      Height:        Intake/Output Summary (Last 24 hours) at 10/13/16 1337 Last data filed at 10/12/16 1839  Gross per 24 hour  Intake              480 ml  Output                0 ml  Net              480 ml   Filed Weights   10/07/16 1413 10/11/16 0900  Weight: 61.2 kg (135 lb) 60.4 kg (133 lb 1.6 oz)    Examination:  General exam: Appears calm and comfortable  Respiratory system: Clear to auscultation. Respiratory effort normal. Cardiovascular system: S1 & S2 heard, 3/6 SEM. No JVD, murmurs, rubs, gallops or clicks. No pedal edema. Gastrointestinal system: Abdomen is nondistended, soft and nontender. No organomegaly or masses felt. Umbilical hernia, is reducible. Normal bowel sounds heard. Central nervous system: Alert and  oriented. No focal neurological deficits. Extremities: Symmetric 5 x 5 power. Skin: No rashes, lesions or ulcers Psychiatry: Judgement and insight appear normal. Mood & affect appropriate.     Data Reviewed: I have personally reviewed following labs and imaging studies  CBC:  Recent Labs Lab 10/07/16 1429 10/08/16 0341 10/11/16 0838 10/12/16 0454  WBC 5.8 5.3 7.2 7.0  NEUTROABS  --   --  6.0  --   HGB 13.7 13.2 13.0 12.1*  HCT 37.5* 38.2* 36.3* 34.8*  MCV 84.8 86.2 85.8 87.2  PLT 136* 139* 138* 132*   Basic Metabolic Panel:  Recent Labs Lab 10/08/16 0341 10/09/16 0355 10/10/16 0411 10/11/16 0414 10/12/16 0454  NA 130* 126* 126* 126* 129*  K 3.5 4.2 3.5 3.5 3.5  CL 93* 90* 92* 92* 96*  CO2 24 24 20* 22 23  GLUCOSE 165* 200* 123* 217* 127*  BUN 94* 93* 90* 94* 80*  CREATININE 1.50* 2.02* 1.84* 1.57* 1.47*  CALCIUM 9.4 9.6 9.4 9.6 9.2  MG  --   --  2.1  --   --    GFR: Estimated Creatinine Clearance: 36.5 mL/min (by C-G formula based on SCr of 1.47 mg/dL (H)). Liver Function Tests:  Recent Labs Lab 10/07/16 1429 10/08/16 0341 10/11/16 0414 10/12/16 0454  AST 39 34 32 32  ALT 26 22 20 18   ALKPHOS 160* 144* 138* 126  BILITOT 5.4* 4.9* 3.5* 3.3*  PROT 8.1 8.2* 8.0 7.6  ALBUMIN 3.1* 3.3* 3.0* 3.0*    Recent Labs Lab 10/07/16 1429  LIPASE 47    Recent Labs Lab 10/07/16 1521 10/08/16 0341 10/10/16 0411 10/11/16 0414 10/12/16 0454  AMMONIA 175* 153* 87* 97* 80*   Coagulation Profile:  Recent Labs Lab 10/07/16 1429  INR 0.96   Cardiac Enzymes: No results for input(s): CKTOTAL, CKMB, CKMBINDEX, TROPONINI in the last 168 hours. BNP (last 3 results) No results for input(s): PROBNP in the last 8760 hours. HbA1C: No results for input(s): HGBA1C in the last 72 hours. CBG:  Recent Labs Lab 10/12/16 1150 10/12/16 1707 10/12/16 2200 10/13/16 0801 10/13/16 1205  GLUCAP 159* 224* 141* 126* 105*   Lipid Profile: No results for input(s):  CHOL, HDL, LDLCALC, TRIG, CHOLHDL, LDLDIRECT in the last 72 hours. Thyroid Function Tests:  Recent Labs  10/11/16 0838  TSH 10.172*   Anemia Panel: No results for input(s): VITAMINB12, FOLATE, FERRITIN, TIBC, IRON, RETICCTPCT in the last 72 hours. Sepsis Labs:  Recent Labs Lab 10/07/16 1717  LATICACIDVEN 1.29    Recent Results (from the past 240 hour(s))  Culture, Urine     Status: Abnormal   Collection Time: 10/07/16  3:20 PM  Result Value Ref Range Status   Specimen Description URINE, RANDOM  Final   Special Requests NONE  Final   Culture >=100,000 COLONIES/mL ENTEROCOCCUS FAECALIS (A)  Final   Report Status 10/10/2016 FINAL  Final   Organism ID, Bacteria ENTEROCOCCUS FAECALIS (A)  Final      Susceptibility   Enterococcus faecalis - MIC*    AMPICILLIN <=2 SENSITIVE Sensitive     LEVOFLOXACIN 1 SENSITIVE Sensitive     NITROFURANTOIN <=16 SENSITIVE Sensitive     VANCOMYCIN 2 SENSITIVE Sensitive     * >=100,000 COLONIES/mL ENTEROCOCCUS FAECALIS  Blood culture (routine x 2)     Status: None   Collection Time: 10/07/16  5:05 PM  Result Value Ref Range Status   Specimen Description BLOOD LEFT ANTECUBITAL  Final   Special Requests BOTTLES DRAWN AEROBIC AND ANAEROBIC 5 CC EACH  Final   Culture   Final    NO GROWTH 5 DAYS Performed at [facility]    Report Status 10/12/2016 FINAL  Final  Blood culture (routine x 2)     Status: None   Collection Time: 10/07/16  5:05 PM  Result Value Ref Range Status   Specimen Description BLOOD RIGHT ARM  Final   Special Requests BOTTLES DRAWN AEROBIC AND ANAEROBIC 5 CC EACH  Final   Culture   Final    NO GROWTH 5 DAYS Performed at [facility]    Report Status 10/12/2016 FINAL  Final         Radiology Studies: No results found.      Scheduled Meds: . amoxicillin  500 mg Oral Q8H  . carvedilol  3.125 mg Oral BID WC  . heparin  5,000 Units Subcutaneous Q8H  . insulin aspart  0-15 Units Subcutaneous TID WC    . insulin aspart  0-5 Units Subcutaneous QHS  . insulin aspart protamine- aspart  14 Units Subcutaneous BID WC  . lactulose  20 g Oral TID  . rifaximin  550 mg Oral BID  . sodium chloride flush  3 mL Intravenous Q12H  . spironolactone  25 mg Oral Daily  . vitamin B-12  1,000 mcg Oral Daily  . vitamin C  500 mg Oral Daily   Continuous Infusions:   LOS: 5 days    Time  spent: 40 mins    Francisco Verne, MD [facility] Pager [phone]  If 7PM-7AM, please contact night-coverage [email or web address] Password [email or web address] 10/13/2016, 1:37 PM

## 2016-10-13 NOTE — Clinical Social Work Placement (Signed)
   CLINICAL SOCIAL WORK PLACEMENT  NOTE  Date:  10/13/2016  Patient Details  Name: [name] MRN: [id number] Date of Birth: [birth year]  Clinical Social Work is seeking post-discharge placement for this patient at the Skilled  Nursing Facility level of care (*CSW will initial, date and re-position this form in  chart as items are completed):  Yes   Patient/family provided with [facility] Clinical Social Work Department's list of facilities offering this level of care within the geographic area requested by the patient (or if unable, by the patient's family).  Yes   Patient/family informed of their freedom to choose among providers that offer the needed level of care, that participate in Medicare, Medicaid or managed care program needed by the patient, have an available bed and are willing to accept the patient.  Yes   Patient/family informed of [facility]'s ownership interest in [facility] and [facility], as well as of the fact that they are under no obligation to receive care at these facilities.  PASRR submitted to EDS on 10/13/16     PASRR number received on 10/13/16     Existing PASRR number confirmed on       FL2 transmitted to all facilities in geographic area requested by pt/family on 10/13/16     FL2 transmitted to all facilities within larger geographic area on       Patient informed that his/her managed care company has contracts with or will negotiate with certain facilities, including the following:            Patient/family informed of bed offers received.  Patient chooses bed at       Physician recommends and patient chooses bed at      Patient to be transferred to   on  .  Patient to be transferred to facility by       Patient family notified on   of transfer.  Name of family member notified:        PHYSICIAN       Additional Comment:    _______________________________________________ Dede QuerySarah Jajaira Ruis, LCSW 10/13/2016, 2:17 PM

## 2016-10-13 NOTE — Clinical Social Work Note (Signed)
Clinical Social Work Assessment  Patient Details  Name: [name] MRN: [id number] Date of Birth: 08/20/1941  Date of referral:  10/13/16               Reason for consult:  Facility Placement                Permission sought to share information with:  Family Supports Permission granted to share information::  Yes, Verbal Permission Granted  Name::     [name]  Relationship::  wife  Contact Information:  [phone]  Housing/Transportation Living arrangements for the past 2 months:  [place] of Information:  Patient, Spouse Patient Interpreter Needed:  None Criminal Activity/Legal Involvement Pertinent to Current Situation/Hospitalization:  No - Comment as needed Significant Relationships:  Spouse Lives with:  Spouse Do you feel safe going back to the place where you live?  Yes Need for family participation in patient care:  Yes (Comment)  Care giving concerns:  No care giving concerns identified.   Social Worker assessment / plan:  CSW met with pt to address consult for New SNF. CSW introduced herself and explained role of social work. CSW also explained the process of discharging to SNF. Pt is agreeable to discharge plan. CSW spoke with wife, who is in agreement with placement. CSW initiated SNF search. CSW will follow up with bed offers. CSW updated MD. CSW will continue to follow.   Employment status:  Retired Nurse, adult PT Recommendations:  [place] / Referral to community resources:  [place]  Patient/Family's Response to care:  Pt was appreciative of CSW support.   Patient/Family's Understanding of and Emotional Response to Diagnosis, Current Treatment, and Prognosis:  Pt and wife understand that pt would benefit from [facility] at [facility]  Emotional Assessment Appearance:  Appears stated age Attitude/Demeanor/Rapport:   (Appropriate) Affect (typically observed):  Accepting,  Adaptable, Pleasant Orientation:  Oriented to Self, Oriented to Place, Oriented to  Time, Oriented to Situation Alcohol / Substance use:  Not Applicable Psych involvement (Current and /or in the community):  No (Comment)  Discharge Needs  Concerns to be addressed:  Adjustment to Illness Readmission within the last 30 days:  No Current discharge risk:  Chronically ill Barriers to Discharge:  Continued Medical Work up   [employer], LCSW 10/13/2016, 2:18 PM

## 2016-10-13 NOTE — NC FL2 (Signed)
[place] MEDICAID FL2 LEVEL OF CARE SCREENING TOOL     IDENTIFICATION  Patient Name: [name] Birthdate: 02/04/1941 Sex: male Admission Date (Current Location): 10/07/2016  [facility] [place]   Facility and Address:  [facility],  [address], [place] [id number]      Provider Number: [id number]  Attending Physician Name and Address:  Rodolph Bonganiel V Thompson, MD  Relative Name and Phone Number:       Current Level of Care: Hospital Recommended Level of Care: Skilled Nursing Facility Prior Approval Number:    Date Approved/Denied:   PASRR Number: [id number]  Discharge Plan: SNF    Current Diagnoses: Patient Active Problem List   Diagnosis Date Noted  . Fall   . Hepatic encephalopathy (HCC)   . Metabolic encephalopathy 10/07/2016  . UTI (urinary tract infection) 10/07/2016  . Ascites 03/27/2016  . 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 Shortness of breath 02/10/2015  . 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 Chronic renal insufficiency, stage III (moderate) 01/28/2015  . Type 2 diabetes with complication (HCC) 01/28/2015  . Acute on chronic left systolic heart failure (HCC) 12/26/2014  . Rash and nonspecific skin eruption 06/20/2014  . Skin lesion of back 06/20/2014  . Preventative health care 06/20/2014  . Right medial knee pain 05/24/2014  . Right ankle sprain 05/24/2014  . Atrial fibrillation (HCC) 05/24/2014  . Hyperlipidemia 05/24/2014  . CHF (congestive heart failure) (HCC) 10/31/2011    Class: Chronic  . Other ascites 10/31/2011    Class: Acute  . DM II (diabetes mellitus, type II), controlled (HCC) 10/31/2011    Class: Chronic  . 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    Class: Chronic    Orientation RESPIRATION BLADDER Height & Weight     Self, Time, Situation, Place  Normal Continent Weight: 133 lb 1.6 oz (60.4 kg) Height:  5\' 5"  (165.1  cm)  BEHAVIORAL SYMPTOMS/MOOD NEUROLOGICAL BOWEL NUTRITION STATUS      Continent Diet (Carb Modified, Thin Liquids)  AMBULATORY STATUS COMMUNICATION OF NEEDS Skin   Extensive Assist Verbally Normal                       Personal Care Assistance Level of Assistance  Bathing, Dressing, Feeding Bathing Assistance: Limited assistance Feeding assistance: Independent Dressing Assistance: Limited assistance     Functional Limitations Info  Sight, Hearing, Speech Sight Info: Adequate Hearing Info: Adequate Speech Info: Adequate    SPECIAL CARE FACTORS FREQUENCY  PT (By licensed PT)     PT Frequency: 5              Contractures Contractures Info: Not present    Additional Factors Info  Code Status, Allergies, Insulin Sliding Scale Code Status Info: Full Code  Allergies Info: Adhesive (tape), Sulfa Antibiotics   Insulin Sliding Scale Info: 4x/day       Current Medications (10/13/2016):  This is the current hospital active medication list Current Facility-Administered Medications  Medication Dose Route Frequency Provider Last Rate Last Dose  . 0.9 %  sodium chloride infusion  250 mL Intravenous PRN Penny Piarlando Vega, MD   Stopped at 10/10/16 0003  . amoxicillin (AMOXIL) capsule 500 mg  500 mg Oral Q8H Catarina Hartshornavid Tat, MD   500 mg at 10/13/16 0641  . carvedilol (COREG) tablet 3.125 mg  3.125 mg Oral BID WC Penny Piarlando Vega, MD   3.125 mg at 10/13/16 0927  . heparin injection 5,000 Units  5,000 Units Subcutaneous Q8H Penny Piarlando Vega, MD      . insulin aspart (novoLOG) injection 0-15 Units  0-15 Units Subcutaneous TID WC Penny Piarlando Vega, MD   2 Units at 10/13/16 [phone]  . insulin aspart (novoLOG) injection 0-5 Units  0-5 Units Subcutaneous QHS Penny Piarlando Vega, MD   0 Units at 10/12/16 2215  . insulin aspart protamine- aspart (NOVOLOG MIX 70/30) injection 14 Units  14 Units Subcutaneous BID WC Rodolph Bonganiel V Thompson, MD   14 Units at 10/13/16 [id number]  . lactulose (CHRONULAC) 10 GM/15ML solution 20 g  20 g  Oral TID Leanne ChangKatherine P Schorr, NP   20 g at 10/13/16 0927  . rifaximin (XIFAXAN) tablet 550 mg  550 mg Oral BID Catarina Hartshornavid Tat, MD   550 mg at 10/13/16 [id number]  . sodium chloride flush (NS) 0.9 % injection 3 mL  3 mL Intravenous Q12H Penny Piarlando Vega, MD   3 mL at 10/13/16 1000  . sodium chloride flush (NS) 0.9 % injection 3 mL  3 mL Intravenous PRN Penny Piarlando Vega, MD   3 mL at 10/11/16 1022  . spironolactone (ALDACTONE) tablet 25 mg  25 mg Oral Daily Penny Piarlando Vega, MD   25 mg at 10/13/16 [id number]  . vitamin B-12 (CYANOCOBALAMIN) tablet 1,000 mcg  1,000 mcg Oral Daily Penny Piarlando Vega, MD   1,000 mcg at 10/13/16 [id number]  . vitamin C (ASCORBIC ACID) tablet 500 mg  500 mg Oral Daily Penny Piarlando Vega, MD   500 mg at 10/13/16 [id number]     Discharge Medications: Please see discharge summary for a list of discharge medications.  Relevant Imaging Results:  Relevant Lab Results:   Additional Information SSN:  [id number]  Dede QuerySarah Iliza Blankenbeckler, LCSW

## 2016-10-14 LAB — GLUCOSE, CAPILLARY
GLUCOSE-CAPILLARY: 77 mg/dL (ref 65–99)
GLUCOSE-CAPILLARY: 111 mg/dL — AB (ref 65–99)
GLUCOSE-CAPILLARY: 230 mg/dL — AB (ref 65–99)
Glucose-Capillary: 265 mg/dL — ABNORMAL HIGH (ref 65–99)

## 2016-10-14 LAB — BASIC METABOLIC PANEL
ANION GAP: 9 (ref 5–15)
BUN: 67 mg/dL — AB (ref 4–21)
BUN: 67 mg/dL — ABNORMAL HIGH (ref 6–20)
CALCIUM: 8.9 mg/dL (ref 8.9–10.3)
CO2: 21 mmol/L — ABNORMAL LOW (ref 22–32)
Chloride: 97 mmol/L — ABNORMAL LOW (ref 101–111)
Creatinine, Ser: 1.37 mg/dL — ABNORMAL HIGH (ref 0.61–1.24)
Creatinine: 1.4 mg/dL — AB (ref 0.6–1.3)
GFR, EST AFRICAN AMERICAN: 56 mL/min — AB (ref >60)
GFR, EST NON AFRICAN AMERICAN: 49 mL/min — AB (ref >60)
Glucose, Bld: 98 mg/dL (ref 65–99)
Glucose: 98 mg/dL
POTASSIUM: 3.8 mmol/L (ref 3.5–5.1)
Potassium: 3.8 mmol/L (ref 3.4–5.3)
SODIUM: 127 mmol/L — AB (ref 137–147)
SODIUM: 127 mmol/L — AB (ref 135–145)

## 2016-10-14 NOTE — Progress Notes (Signed)
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 fib not on anticoagulation due to prior history of GI bleed and nonadherence to medications,, CK D stage III, coronary artery disease, hypertension, type 2 diabetes mellitus, umbilical hernia and prior history of PE was brought to the ED by his wife with 5 days of confusion. Patient had an unwitnessed fall at home 5 days back following which he started having increased confusion. Wife also noticed that he was having foul-smelling urine. No fevers or chills, nausea, vomiting, chest pain, shortness of breath, abdominal pain, diarrhea, pain in joints. Patient has been ambulating with difficulty and unsteady. In the ED he was afebrile but confused. Blood work showed normal WBC, sodium of 129, potassium of 3.3, chloride of 90, BUN of 92 and creatinine 1.67. Glucose of 216 and elevated ammonia of 175. He had elevated total bilirubin with normal AST/ALT and elevated alkaline phosphatase. Lactic acid was normal. UA was suggestive of UTI. Chest x-ray was unremarkable. CT of the abdomen and pelvis showed cirrhotic liver with small ascites and right pleural effusion.  Patient admitted for acute metabolic encephalopathy  secondary to UTI and hepatic encephalopathy.   Assessment & Plan:   Principal Problem:   Metabolic encephalopathy Active Problems:   Hepatic encephalopathy (HCC)   CHF (congestive heart failure) (HCC)   DM II (diabetes mellitus, type II), controlled (HCC)   Atrial fibrillation (HCC)   Type 2 diabetes with complication (HCC)   UTI (urinary tract infection)   Fall  Acute metabolic encephalopathy -Secondary to UTI and hepatic encephalopathy.  --Patient with  clinical improvement. Urine cultures positive for Enterococcus faecalis which is sensitive to the penicillins, quinolones and vancomycin. IV Rocephin has been transitioned to oral amoxicillin. Continue lactulose and Xifaxan.  -Mental status improving per wife at the bedside--close to baseline  Enterococcus UTI -Patient was on IV Rocephin and subsequently transitioned to oral amoxicillin to complete course of antibiotic treatment for Enterococcus faecalis UTI.  Hepatic encephalopathy -Patient has liver cirrhosis likely secondary to chronic hepatic congestion from the patient's pulmonary hypertension -Ammonia 175 at the time of admission -Repeat ammonia level at 80 on 10/12/2016. Patient with clinical improvement. -Continue lactulose--patient had been refusing intermittently -Discussed with the patient importance of taking lactulose Continue Xifaxan.  Active Problems: Cirrhosis of liver -Secondary to chronic hepatic congestion from pulmonary hypertension and likely cor pulmonale -Hepatitis viral serologies negative -Continue spironolactone, Coreg, Xifaxan, lactulose  Chronic systolic congestive heart failure -Euvolemic clinically -Admission weight 135 pounds -holding lasix due to increasing serum creatinine  and BP -Continue carvedilol and spironolactone -Current weight is 133 pounds.  Acute on chronic renal failure--CKD stage III -Baseline creatinine 1.4-1.6 -Holding furosemide -given NS x 500 cc, as he appearred dry clinically  DM II (diabetes mellitus, type II), controlled (HCC) -07/11/2016, A1c 7.2. CBGs have ranged from 77-111 Continue NovoLog 70/30 to14 units twice daily. -Continue NovoLog sliding scale  Atrial fibrillation (HCC) Rate controlled.  -Continue beta blocker.  -Not an anticoagulation due to history of? GI bleed and nonadherence to medication.  Hyponatremia -Chronic ranging 129-132, likely due to CHF and cirrhosis. Continue spironolactone. Sodium  level stable now have improved and currently at 127 at baseline.  Patient does not look significantly volume overloaded on examination. Blood pressure is borderline.  Holding Lasix.  Recent fall with unsteady gait. Ct head negative. Needs skilled nursing facility.  Hypokalemia Repleted.    DVT prophylaxis: Heparin Code Status: Full Family Communication: Updated patient.  Disposition Plan: SNF when bed available.    Consultants:   None  Procedures:   CT head 10/09/2016,   CT abdomen and pelvis 10/07/2016  Chest x-ray 10/07/2016    Antimicrobials:   Amoxicillin 10/10/2016  IV Rocephin 10/07/2016>>>>> 10/10/2016     Subjective: Patient sleeping, however easily arousable. Patient is alert to self and place and time. Patient denies any chest pain. No shortness of breath. No dizziness. Feeling better.   Objective: Vitals:   10/13/16 0538 10/13/16 1353 10/13/16 2049 10/14/16 0503  BP: 107/65 (!) 95/57 122/69 (!) 92/51  Pulse: 79 68 66 77  Resp: 16 16 18 16   Temp: 97.9 F (36.6 C)  97.8 F (36.6 C) 97.6 F (36.4 C)  TempSrc: Oral  Oral Oral  SpO2: 96% 97% 98% 98%  Weight:      Height:        Intake/Output Summary (Last 24 hours) at 10/14/16 1158 Last data filed at 10/14/16 1012  Gross per 24 hour  Intake              300 ml  Output                0 ml  Net              300 ml   Filed Weights   10/07/16 1413 10/11/16 0900  Weight: 61.2 kg (135 lb) 60.4 kg (133 lb 1.6 oz)    Examination:  General exam: Appears calm and comfortable  Respiratory system: Clear to auscultation. Respiratory effort normal. Cardiovascular system: S1 & S2 heard, 3/6 SEM. No JVD, murmurs, rubs, gallops or clicks. No pedal edema. Gastrointestinal system: Abdomen is nondistended, soft and nontender. No organomegaly or masses felt. Umbilical hernia, is reducible. Normal bowel sounds heard. Central nervous system: Alert and oriented. No focal neurological deficits. Extremities:  Symmetric 5 x 5 power. Skin: No rashes, lesions or ulcers Psychiatry: Judgement and insight appear normal. Mood & affect appropriate.     Data Reviewed: I have personally reviewed following labs and imaging studies  CBC:  Recent Labs Lab 10/07/16 1429 10/08/16 0341 10/11/16 0838 10/12/16 0454  WBC 5.8 5.3 7.2 7.0  NEUTROABS  --   --  6.0  --   HGB 13.7 13.2 13.0 12.1*  HCT 37.5* 38.2* 36.3* 34.8*  MCV 84.8 86.2 85.8 87.2  PLT 136* 139* 138* 132*   Basic Metabolic Panel:  Recent Labs Lab 10/09/16 0355 10/10/16 0411 10/11/16 0414 10/12/16 0454 10/14/16 0410  NA 126* 126* 126* 129* 127*  K 4.2 3.5 3.5 3.5 3.8  CL 90* 92* 92* 96* 97*  CO2 24 20* 22 23 21*  GLUCOSE 200* 123* 217* 127* 98  BUN 93* 90* 94* 80* 67*  CREATININE 2.02* 1.84* 1.57* 1.47* 1.37*  CALCIUM 9.6 9.4 9.6 9.2 8.9  MG  --  2.1  --   --   --    GFR: Estimated Creatinine Clearance: 39.2 mL/min (by C-G formula based on SCr of 1.37 mg/dL (H)). Liver Function Tests:  Recent Labs Lab 10/07/16 1429 10/08/16 0341 10/11/16 0414 10/12/16 0454  AST 39 34 32 32  ALT 26 22 20 18   ALKPHOS 160* 144* 138* 126  BILITOT 5.4* 4.9* 3.5* 3.3*  PROT 8.1 8.2* 8.0 7.6  ALBUMIN 3.1* 3.3* 3.0* 3.0*  Recent Labs Lab 10/07/16 1429  LIPASE 47    Recent Labs Lab 10/07/16 1521 10/08/16 0341 10/10/16 0411 10/11/16 0414 10/12/16 0454  AMMONIA 175* 153* 87* 97* 80*   Coagulation Profile:  Recent Labs Lab 10/07/16 1429  INR 0.96   Cardiac Enzymes: No results for input(s): CKTOTAL, CKMB, CKMBINDEX, TROPONINI in the last 168 hours. BNP (last 3 results) No results for input(s): PROBNP in the last 8760 hours. HbA1C: No results for input(s): HGBA1C in the last 72 hours. CBG:  Recent Labs Lab 10/13/16 0801 10/13/16 1205 10/13/16 1723 10/13/16 1959 10/14/16 0745  GLUCAP 126* 105* 139* 161* 77   Lipid Profile: No results for input(s): CHOL, HDL, LDLCALC, TRIG, CHOLHDL, LDLDIRECT in the last 72  hours. Thyroid Function Tests: No results for input(s): TSH, T4TOTAL, FREET4, T3FREE, THYROIDAB in the last 72 hours. Anemia Panel: No results for input(s): VITAMINB12, FOLATE, FERRITIN, TIBC, IRON, RETICCTPCT in the last 72 hours. Sepsis Labs:  Recent Labs Lab 10/07/16 1717  LATICACIDVEN 1.29    Recent Results (from the past 240 hour(s))  Culture, Urine     Status: Abnormal   Collection Time: 10/07/16  3:20 PM  Result Value Ref Range Status   Specimen Description URINE, RANDOM  Final   Special Requests NONE  Final   Culture >=100,000 COLONIES/mL ENTEROCOCCUS FAECALIS (A)  Final   Report Status 10/10/2016 FINAL  Final   Organism ID, Bacteria ENTEROCOCCUS FAECALIS (A)  Final      Susceptibility   Enterococcus faecalis - MIC*    AMPICILLIN <=2 SENSITIVE Sensitive     LEVOFLOXACIN 1 SENSITIVE Sensitive     NITROFURANTOIN <=16 SENSITIVE Sensitive     VANCOMYCIN 2 SENSITIVE Sensitive     * >=100,000 COLONIES/mL ENTEROCOCCUS FAECALIS  Blood culture (routine x 2)     Status: None   Collection Time: 10/07/16  5:05 PM  Result Value Ref Range Status   Specimen Description BLOOD LEFT ANTECUBITAL  Final   Special Requests BOTTLES DRAWN AEROBIC AND ANAEROBIC 5 CC EACH  Final   Culture   Final    NO GROWTH 5 DAYS [facility]    Report Status 10/12/2016 FINAL  Final  Blood culture (routine x 2)     Status: None   Collection Time: 10/07/16  5:05 PM  Result Value Ref Range Status   Specimen Description BLOOD RIGHT ARM  Final   Special Requests BOTTLES DRAWN AEROBIC AND ANAEROBIC 5 CC EACH  Final   Culture   Final    NO GROWTH 5 DAYS [facility]    Report Status 10/12/2016 FINAL  Final         Radiology Studies: No results found.      Scheduled Meds: . amoxicillin  500 mg Oral Q8H  . carvedilol  3.125 mg Oral BID WC  . heparin  5,000 Units Subcutaneous Q8H  . insulin aspart  0-15 Units Subcutaneous TID WC  . insulin aspart   0-5 Units Subcutaneous QHS  . insulin aspart protamine- aspart  14 Units Subcutaneous BID WC  . lactulose  20 g Oral TID  . rifaximin  550 mg Oral BID  . spironolactone  25 mg Oral Daily  . vitamin B-12  1,000 mcg Oral Daily  . vitamin C  500 mg Oral Daily   Continuous Infusions:   LOS: 6 days    Time spent: 40 mins    Carnie Bruemmer, MD [facility] Pager [phone]  If 7PM-7AM,  please contact night-coverage [email or web address] Password [facility] 10/14/2016, 11:58 AM

## 2016-10-15 DIAGNOSIS — R17 Unspecified jaundice: Secondary | ICD-10-CM

## 2016-10-15 DIAGNOSIS — N390 Urinary tract infection, site not specified: Secondary | ICD-10-CM

## 2016-10-15 DIAGNOSIS — R319 Hematuria, unspecified: Secondary | ICD-10-CM

## 2016-10-15 LAB — GLUCOSE, CAPILLARY
GLUCOSE-CAPILLARY: 160 mg/dL — AB (ref 65–99)
Glucose-Capillary: 45 mg/dL — ABNORMAL LOW (ref 65–99)
Glucose-Capillary: 68 mg/dL (ref 65–99)
Glucose-Capillary: 114 mg/dL — ABNORMAL HIGH (ref 65–99)
Glucose-Capillary: 97 mg/dL (ref 65–99)
Glucose-Capillary: 121 mg/dL — ABNORMAL HIGH (ref 65–99)

## 2016-10-15 LAB — BASIC METABOLIC PANEL
Anion gap: 9 (ref 5–15)
BUN: 67 mg/dL — AB (ref 6–20)
BUN: 67 mg/dL — AB (ref 4–21)
CALCIUM: 8.5 mg/dL — AB (ref 8.9–10.3)
CHLORIDE: 97 mmol/L — AB (ref 101–111)
CO2: 20 mmol/L — AB (ref 22–32)
CREATININE: 1.46 mg/dL — AB (ref 0.61–1.24)
CREATININE: 1.5 mg/dL — AB (ref 0.6–1.3)
GFR calc non Af Amer: 45 mL/min — ABNORMAL LOW (ref >60)
GFR, EST AFRICAN AMERICAN: 52 mL/min — AB (ref >60)
GLUCOSE: 128 mg/dL — AB (ref 65–99)
GLUCOSE: 128 mg/dL
POTASSIUM: 4 mmol/L (ref 3.4–5.3)
Potassium: 4 mmol/L (ref 3.5–5.1)
SODIUM: 126 mmol/L — AB (ref 137–147)
Sodium: 126 mmol/L — ABNORMAL LOW (ref 135–145)

## 2016-10-15 MED ORDER — INSULIN ASPART PROT & ASPART (70-30 MIX) 100 UNIT/ML ~~LOC~~ SUSP
7.0000 [IU] | Freq: Two times a day (BID) | SUBCUTANEOUS | Status: DC
  Administered 2016-10-15 – 2016-10-16 (×2): 7 [IU] via SUBCUTANEOUS

## 2016-10-15 NOTE — Progress Notes (Signed)
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 fib not on anticoagulation due to prior history of GI bleed and nonadherence to medications,, CK D stage III, coronary artery disease, hypertension, type 2 diabetes mellitus, umbilical hernia and prior history of PE was brought to the ED by his wife with 5 days of confusion. Patient had an unwitnessed fall at home 5 days back following which he started having increased confusion. Wife also noticed that he was having foul-smelling urine. No fevers or chills, nausea, vomiting, chest pain, shortness of breath, abdominal pain, diarrhea, pain in joints. Patient has been ambulating with difficulty and unsteady. In the ED he was afebrile but confused. Blood work showed normal WBC, sodium of 129, potassium of 3.3, chloride of 90, BUN of 92 and creatinine 1.67. Glucose of 216 and elevated ammonia of 175. He had elevated total bilirubin with normal AST/ALT and elevated alkaline phosphatase. Lactic acid was normal. UA was suggestive of UTI. Chest x-ray was unremarkable. CT of the abdomen and pelvis showed cirrhotic liver with small ascites and right pleural effusion.  Patient admitted for acute metabolic encephalopathy  secondary to UTI and hepatic encephalopathy.   Assessment & Plan:   Principal Problem:   Metabolic encephalopathy Active Problems:   Hepatic encephalopathy (HCC)   CHF (congestive heart failure) (HCC)   DM II (diabetes mellitus, type II), controlled (HCC)   Atrial fibrillation (HCC)   Type 2 diabetes with complication (HCC)   UTI (urinary tract infection)   Fall  Acute metabolic encephalopathy -Secondary to UTI and hepatic encephalopathy.  --Patient with  clinical improvement. Urine cultures positive for Enterococcus faecalis which is sensitive to the penicillins, quinolones and vancomycin. IV Rocephin has been transitioned to oral amoxicillin. Continue lactulose and Xifaxan.  -Mental status improving per wife at the bedside--close to baseline  Enterococcus UTI -Patient was on IV Rocephin and subsequently transitioned to oral amoxicillin to complete course of antibiotic treatment for Enterococcus faecalis UTI.  Hepatic encephalopathy -Patient has liver cirrhosis likely secondary to chronic hepatic congestion from the patient's pulmonary hypertension -Ammonia 175 at the time of admission -Repeat ammonia level at 80 on 10/12/2016. Patient with clinical improvement. -Continue lactulose--patient had been refusing intermittently -Discussed with the patient importance of taking lactulose Continue Xifaxan.  Active Problems: Cirrhosis of liver -Secondary to chronic hepatic congestion from pulmonary hypertension and likely cor pulmonale -Hepatitis viral serologies negative -Continue spironolactone, Coreg, Xifaxan, lactulose -Lasix on hold secondary to borderline blood pressure. Patient does not look volume overloaded on examination.  Chronic systolic congestive heart failure -Euvolemic clinically -Admission weight 135 pounds -holding lasix due to increasing serum creatinine  and borderline BP -Continue carvedilol and spironolactone -Current weight is 133 pounds.  Acute on chronic renal failure--CKD stage III -Baseline creatinine 1.4-1.6 -Holding furosemide secondary to borderline blood pressure -given NS x 500 cc, as he appearred dry clinically -Currently at baseline.  DM II (diabetes mellitus, type II), controlled (HCC) -07/11/2016, A1c 7.2. CBGs this morning was 45. Blood glucose levels improved with juice and food. We'll decrease NovoLog 70/30 to 7 units twice daily.  -Continue NovoLog sliding scale  Atrial fibrillation  (HCC) Rate controlled.  -Continue beta blocker.  -Not an anticoagulation due to history of? GI bleed and nonadherence to medication.  Hyponatremia -Chronic  ranging 129-132, likely due to CHF and cirrhosis. Continue spironolactone. Sodium level stable now have improved and currently at 126-127 at baseline.  Patient does not look significantly volume overloaded on examination. Blood pressure is borderline. Holding Lasix.  Recent fall with unsteady gait. Ct head negative. Needs skilled nursing facility.  Hypokalemia Repleted.    DVT prophylaxis: Heparin Code Status: Full Family Communication: Updated patient.  Disposition Plan: SNF when bed available hopefully tomorrow if CBGs remain stable.    Consultants:   None  Procedures:   CT head 10/09/2016,   CT abdomen and pelvis 10/07/2016  Chest x-ray 10/07/2016    Antimicrobials:   Amoxicillin 10/10/2016  IV Rocephin 10/07/2016>>>>> 10/10/2016     Subjective: Patient sleeping, however easily arousable. Patient is alert to self and place and time. Patient noted to have a blood glucose of 45 this morning. Patient given some juice with improvement in his blood glucose.   Objective: Vitals:   10/14/16 1405 10/14/16 1500 10/14/16 2150 10/15/16 0501  BP: (!) 103/59  101/65 112/77  Pulse: 76  74 77  Resp: 17  16 16   Temp: 98.2 F (36.8 C)  97.5 F (36.4 C) 97.6 F (36.4 C)  TempSrc: Oral  Axillary Oral  SpO2: 96%  99% 99%  Weight:  64.1 kg (141 lb 4.8 oz)  64.1 kg (141 lb 5 oz)  Height:        Intake/Output Summary (Last 24 hours) at 10/15/16 1115 Last data filed at 10/15/16 0502  Gross per 24 hour  Intake              950 ml  Output                0 ml  Net              950 ml   Filed Weights   10/11/16 0900 10/14/16 1500 10/15/16 0501  Weight: 60.4 kg (133 lb 1.6 oz) 64.1 kg (141 lb 4.8 oz) 64.1 kg (141 lb 5 oz)    Examination:  General exam: Appears calm and comfortable  Respiratory system: Clear  to auscultation. Respiratory effort normal. Cardiovascular system: S1 & S2 heard, 3/6 SEM. No JVD, murmurs, rubs, gallops or clicks. No pedal edema. Gastrointestinal system: Abdomen is nondistended, soft and nontender. No organomegaly or masses felt. Umbilical hernia, is reducible. Normal bowel sounds heard. Central nervous system: Alert and oriented. No focal neurological deficits. Extremities: Symmetric 5 x 5 power. Skin: No rashes, lesions or ulcers Psychiatry: Judgement and insight appear normal. Mood & affect appropriate.     Data Reviewed: I have personally reviewed following labs and imaging studies  CBC:  Recent Labs Lab 10/11/16 0838 10/12/16 0454  WBC 7.2 7.0  NEUTROABS 6.0  --   HGB 13.0 12.1*  HCT 36.3* 34.8*  MCV 85.8 87.2  PLT 138* 132*   Basic Metabolic Panel:  Recent Labs Lab 10/10/16 0411 10/11/16 0414 10/12/16 0454 10/14/16 0410 10/15/16 0923  NA 126* 126* 129* 127* 126*  K 3.5 3.5 3.5 3.8 4.0  CL 92* 92* 96* 97* 97*  CO2 20* 22 23 21* 20*  GLUCOSE 123* 217* 127* 98 128*  BUN 90* 94* 80* 67* 67*  CREATININE 1.84* 1.57* 1.47* 1.37* 1.46*  CALCIUM 9.4 9.6 9.2 8.9 8.5*  MG 2.1  --   --   --   --    GFR: Estimated Creatinine Clearance: 37.4 mL/min (by C-G formula based on SCr of 1.46 mg/dL (H)). Liver Function  Tests:  Recent Labs Lab 10/11/16 0414 10/12/16 0454  AST 32 32  ALT 20 18  ALKPHOS 138* 126  BILITOT 3.5* 3.3*  PROT 8.0 7.6  ALBUMIN 3.0* 3.0*   No results for input(s): LIPASE, AMYLASE in the last 168 hours.  Recent Labs Lab 10/10/16 0411 10/11/16 0414 10/12/16 0454  AMMONIA 87* 97* 80*   Coagulation Profile: No results for input(s): INR, PROTIME in the last 168 hours. Cardiac Enzymes: No results for input(s): CKTOTAL, CKMB, CKMBINDEX, TROPONINI in the last 168 hours. BNP (last 3 results) No results for input(s): PROBNP in the last 8760 hours. HbA1C: No results for input(s): HGBA1C in the last 72 hours. CBG:  Recent  Labs Lab 10/14/16 1625 10/14/16 2151 10/15/16 0752 10/15/16 0821 10/15/16 0915  GLUCAP 265* 230* 45* 68 114*   Lipid Profile: No results for input(s): CHOL, HDL, LDLCALC, TRIG, CHOLHDL, LDLDIRECT in the last 72 hours. Thyroid Function Tests: No results for input(s): TSH, T4TOTAL, FREET4, T3FREE, THYROIDAB in the last 72 hours. Anemia Panel: No results for input(s): VITAMINB12, FOLATE, FERRITIN, TIBC, IRON, RETICCTPCT in the last 72 hours. Sepsis Labs: No results for input(s): PROCALCITON, LATICACIDVEN in the last 168 hours.  Recent Results (from the past 240 hour(s))  Culture, Urine     Status: Abnormal   Collection Time: 10/07/16  3:20 PM  Result Value Ref Range Status   Specimen Description URINE, RANDOM  Final   Special Requests NONE  Final   Culture >=100,000 COLONIES/mL ENTEROCOCCUS FAECALIS (A)  Final   Report Status 10/10/2016 FINAL  Final   Organism ID, Bacteria ENTEROCOCCUS FAECALIS (A)  Final      Susceptibility   Enterococcus faecalis - MIC*    AMPICILLIN <=2 SENSITIVE Sensitive     LEVOFLOXACIN 1 SENSITIVE Sensitive     NITROFURANTOIN <=16 SENSITIVE Sensitive     VANCOMYCIN 2 SENSITIVE Sensitive     * >=100,000 COLONIES/mL ENTEROCOCCUS FAECALIS  Blood culture (routine x 2)     Status: None   Collection Time: 10/07/16  5:05 PM  Result Value Ref Range Status   Specimen Description BLOOD LEFT ANTECUBITAL  Final   Special Requests BOTTLES DRAWN AEROBIC AND ANAEROBIC 5 CC EACH  Final   Culture   Final    NO GROWTH 5 DAYS Performed at [facility]    Report Status 10/12/2016 FINAL  Final  Blood culture (routine x 2)     Status: None   Collection Time: 10/07/16  5:05 PM  Result Value Ref Range Status   Specimen Description BLOOD RIGHT ARM  Final   Special Requests BOTTLES DRAWN AEROBIC AND ANAEROBIC 5 CC EACH  Final   Culture   Final    NO GROWTH 5 DAYS Performed at [facility]    Report Status 10/12/2016 FINAL  Final         Radiology  Studies: No results found.      Scheduled Meds: . amoxicillin  500 mg Oral Q8H  . carvedilol  3.125 mg Oral BID WC  . heparin  5,000 Units Subcutaneous Q8H  . insulin aspart  0-15 Units Subcutaneous TID WC  . insulin aspart  0-5 Units Subcutaneous QHS  . insulin aspart protamine- aspart  14 Units Subcutaneous BID WC  . lactulose  20 g Oral TID  . rifaximin  550 mg Oral BID  . spironolactone  25 mg Oral Daily  . vitamin B-12  1,000 mcg Oral Daily  . vitamin C  500 mg Oral  Daily   Continuous Infusions:   LOS: 7 days    Time spent: 35 mins    THOMPSON,DANIEL, MD [facility] Pager [phone]  If 7PM-7AM, please contact night-coverage [email or web address] Password [email or web address] 10/15/2016, 11:15 AM

## 2016-10-15 NOTE — Progress Notes (Signed)
CBG: 45  Treatment: 15 GM carbohydrate snack  Symptoms: None  Follow-up CBG: Time:  0821 CBG Result: 68  Possible Reasons for Event: Inadequate meal intake   Follow-up CBG: Time:  0915   CBG Result 114  Patient did denied hypoglycemic symptoms and is comfortable. Had breakfast and 2 glasses of orange juice. Novolog and 70/30 mix held this morning.

## 2016-10-15 NOTE — Care Management Important Message (Signed)
Important Message  Patient Details  Name: [name] MRN: [id number] Date of Birth: 1941-03-08   Medicare Important Message Given:  Yes    Caren MacadamFuller, Jackolyn Geron 10/15/2016, 9:57 AMImportant Message  Patient Details  Name: [name] MRN: [id number] Date of Birth: 1941-03-08   Medicare Important Message Given:  Yes    Caren MacadamFuller, Pattie Flaharty 10/15/2016, 9:57 AM

## 2016-10-15 NOTE — Care Management Important Message (Signed)
Important Message  Patient Details  Name: [name] MRN: [id number] Date of Birth: 31-Dec-1940   Medicare Important Message Given:  Yes    Caren MacadamFuller, Ioma Chismar 10/15/2016, 10:25 AMImportant Message  Patient Details  Name: [name] MRN: [id number] Date of Birth: 31-Dec-1940   Medicare Important Message Given:  Yes    Caren MacadamFuller, Ayeza Therriault 10/15/2016, 10:25 AM

## 2016-10-15 NOTE — Progress Notes (Signed)
Patient agreeable to SNF [facility]. Patient updated about patient choice. Patient will need authorization prior to discharging.   [name], Francisco MajorsLCSWA, MSW Clinical Social Worker 5E and Psychiatric Service Line [phone] 10/15/2016  12:02 PM

## 2016-10-15 NOTE — Progress Notes (Signed)
Inpatient Diabetes Program Recommendations  AACE/ADA: New Consensus Statement on Inpatient Glycemic Control (2015)  Target Ranges:  Prepandial:   less than 140 mg/dL      Peak postprandial:   less than 180 mg/dL (1-2 hours)      Critically ill patients:  140 - 180 mg/dL   Lab Results  Component Value Date   GLUCAP 97 10/15/2016   HGBA1C 7.2 (H) 07/11/2016    Review of Glycemic Control Results for [name], [name] (MRN [id number]) as of 10/15/2016 13:41  Ref. Range 10/14/2016 21:51 10/15/2016 07:52 10/15/2016 08:21 10/15/2016 09:15 10/15/2016 11:37  Glucose-Capillary Latest Ref Range: 65 - 99 mg/dL 409230 (H) 45 (L) 68 811114 (H) 97    Inpatient Diabetes Program Recommendations: Noted hypoglycemia on 70/30 14 units. Please consider decrease to 12 units bid.  Thank you, Billy FischerJudy E. Lyrica Mcclarty, RN, MSN, CDE Inpatient Glycemic Control Team Team Pager [phone] (8am-5pm) 10/15/2016 1:42 PM

## 2016-10-16 DIAGNOSIS — W19XXXS Unspecified fall, sequela: Secondary | ICD-10-CM

## 2016-10-16 LAB — BASIC METABOLIC PANEL
ANION GAP: 11 (ref 5–15)
Anion gap: 10 (ref 5–15)
BUN: 72 mg/dL — AB (ref 4–21)
BUN: 72 mg/dL — ABNORMAL HIGH (ref 6–20)
BUN: 71 mg/dL — AB (ref 4–21)
BUN: 71 mg/dL — AB (ref 6–20)
CALCIUM: 8.4 mg/dL — AB (ref 8.9–10.3)
CALCIUM: 8.5 mg/dL — AB (ref 8.9–10.3)
CO2: 18 mmol/L — ABNORMAL LOW (ref 22–32)
CO2: 18 mmol/L — AB (ref 22–32)
Chloride: 97 mmol/L — ABNORMAL LOW (ref 101–111)
Chloride: 104 mmol/L (ref 101–111)
Creatinine, Ser: 1.58 mg/dL — ABNORMAL HIGH (ref 0.61–1.24)
Creatinine, Ser: 1.55 mg/dL — ABNORMAL HIGH (ref 0.61–1.24)
Creatinine: 1.6 mg/dL — AB (ref 0.6–1.3)
Creatinine: 1.6 mg/dL — AB (ref 0.6–1.3)
GFR calc Af Amer: 47 mL/min — ABNORMAL LOW (ref >60)
GFR calc Af Amer: 48 mL/min — ABNORMAL LOW (ref >60)
GFR calc non Af Amer: 42 mL/min — ABNORMAL LOW (ref >60)
GFR, EST NON AFRICAN AMERICAN: 41 mL/min — AB (ref >60)
GLUCOSE: 200 mg/dL
GLUCOSE: 200 mg/dL — AB (ref 65–99)
Glucose, Bld: 120 mg/dL — ABNORMAL HIGH (ref 65–99)
Glucose: 120 mg/dL
POTASSIUM: 4.1 mmol/L (ref 3.4–5.3)
POTASSIUM: 4.1 mmol/L (ref 3.5–5.1)
Potassium: 4.1 mmol/L (ref 3.4–5.3)
Potassium: 4.1 mmol/L (ref 3.5–5.1)
SODIUM: 133 mmol/L — AB (ref 137–147)
SODIUM: 125 mmol/L — AB (ref 135–145)
Sodium: 125 mmol/L — AB (ref 137–147)
Sodium: 133 mmol/L — ABNORMAL LOW (ref 135–145)

## 2016-10-16 LAB — GLUCOSE, CAPILLARY
GLUCOSE-CAPILLARY: 108 mg/dL — AB (ref 65–99)
Glucose-Capillary: 125 mg/dL — ABNORMAL HIGH (ref 65–99)
Glucose-Capillary: 198 mg/dL — ABNORMAL HIGH (ref 65–99)
Glucose-Capillary: 219 mg/dL — ABNORMAL HIGH (ref 65–99)

## 2016-10-16 LAB — CBC
HCT: 36.6 % — ABNORMAL LOW (ref 39.0–52.0)
Hemoglobin: 12.8 g/dL — ABNORMAL LOW (ref 13.0–17.0)
MCH: 31.1 pg (ref 26.0–34.0)
MCHC: 35 g/dL (ref 30.0–36.0)
MCV: 88.8 fL (ref 78.0–100.0)
PLATELETS: 180 10*3/uL (ref 150–400)
RBC: 4.12 MIL/uL — AB (ref 4.22–5.81)
RDW: 16.5 % — ABNORMAL HIGH (ref 11.5–15.5)
WBC: 9.2 10*3/uL (ref 4.0–10.5)

## 2016-10-16 LAB — AMMONIA: AMMONIA: 36 umol/L — AB (ref 9–35)

## 2016-10-16 LAB — CBC AND DIFFERENTIAL: WBC: 9.2 10^3/mL

## 2016-10-16 LAB — OSMOLALITY: Osmolality: 311 mOsm/kg — ABNORMAL HIGH (ref 275–295)

## 2016-10-16 MED ORDER — FUROSEMIDE 20 MG PO TABS
20.0000 mg | ORAL_TABLET | Freq: Two times a day (BID) | ORAL | Status: DC
  Administered 2016-10-16 – 2016-10-17 (×3): 20 mg via ORAL
  Filled 2016-10-16 (×3): qty 1

## 2016-10-16 MED ORDER — INSULIN ASPART PROT & ASPART (70-30 MIX) 100 UNIT/ML ~~LOC~~ SUSP
6.0000 [IU] | Freq: Two times a day (BID) | SUBCUTANEOUS | Status: DC
  Administered 2016-10-16 – 2016-10-17 (×2): 6 [IU] via SUBCUTANEOUS

## 2016-10-16 NOTE — Consult Note (Signed)
   [facility]   10/16/2016  [name] 1941-04-22 [id number]    [facility]. Chart reviewed. Noted current discharge plan is for SNF. Confirmed with inpatient LCSW.  There are no identifiable [facility] needs at this time. If patient's post hospital needs change, please place a [facility] consult. For questions please contact:  Raiford Nobletika Hall, MSN-Ed, RN,BSN [facility] Care Management Hospital Liaison [phone]

## 2016-10-16 NOTE — Progress Notes (Signed)
Physical Therapy Treatment Patient Details Name: [name] MRN: [id number] DOB: [birth year] Today's Date: 10/16/2016    History of Present Illness [name] is a 75 y.o. male with medical history significant of CHF presumed diastolic, Afib presenting with 5 days of confusion. Pt had fall around that time.  Dx of hepatic encephalopathy, UTI, ARF.     PT Comments    Pt ambulated 120' with RW and min A to manage turns and obstacles, distance limited by need to use bathroom.   Follow Up Recommendations  SNF;Supervision/Assistance - 24 hour (unless he has 24/7 assistance)     Equipment Recommendations  Rolling walker with 5" wheels (unsure)    Recommendations for Other Services       Precautions / Restrictions Precautions Precautions: Fall Precaution Comments: fell prior to admission    Mobility  Bed Mobility Overal bed mobility: Modified Independent Bed Mobility: Sidelying to Sit   Sidelying to sit: Supervision       General bed mobility comments: VCs for technique  Transfers Overall transfer level: Needs assistance Equipment used: Rolling walker (2 wheeled) Transfers: Sit to/from Stand Sit to Stand: Min guard         General transfer comment: VCs hand placement  Ambulation/Gait Ambulation/Gait assistance: Min assist Ambulation Distance (Feet): 120 Feet Assistive device: Rolling walker (2 wheeled) Gait Pattern/deviations: Step-through pattern   Gait velocity interpretation: at or above normal speed for age/gender General Gait Details: min A to steer & maneuver RW esp during turns and to avoid obstacles, VCs for positioning in RW; distance limited by need to have BM   Stairs            Wheelchair Mobility    Modified Rankin (Stroke Patients Only)       Balance Overall balance assessment: History of Falls;Needs assistance Sitting-balance support: Feet supported Sitting balance-Leahy Scale: Fair     Standing balance support:  Bilateral upper extremity supported;During functional activity Standing balance-Leahy Scale: Poor                      Cognition Arousal/Alertness: Awake/alert Behavior During Therapy: WFL for tasks assessed/performed Overall Cognitive Status: Within Functional Limits for tasks assessed                      Exercises      General Comments        Pertinent Vitals/Pain Pain Assessment: No/denies pain    Home Living                      Prior Function            PT Goals (current goals can now be found in the care plan section) Acute Rehab PT Goals Patient Stated Goal: agreed to walk PT Goal Formulation: With patient/family Time For Goal Achievement: 10/24/16 Potential to Achieve Goals: Fair Progress towards PT goals: Progressing toward goals    Frequency    Min 3X/week      PT Plan Current plan remains appropriate    Co-evaluation             End of Session Equipment Utilized During Treatment: Gait belt Activity Tolerance: Patient tolerated treatment well Patient left: with call bell/phone within reach;with family/visitor present (on bedside commode)     Time: 1320-1330 PT Time Calculation (min) (ACUTE ONLY): 10 min  Charges:  $Gait Training: 8-22 mins  G Codes:      Tamala SerUhlenberg, Morrie Daywalt Kistler 10/16/2016, 1:37 PM [phone]

## 2016-10-16 NOTE — Progress Notes (Signed)
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 fib not on anticoagulation due to prior history of GI bleed and nonadherence to medications,, CK D stage III, coronary artery disease, hypertension, type 2 diabetes mellitus, umbilical hernia and prior history of PE was brought to the ED by his wife with 5 days of confusion. Patient had an unwitnessed fall at home 5 days back following which he started having increased confusion. Wife also noticed that he was having foul-smelling urine. No fevers or chills, nausea, vomiting, chest pain, shortness of breath, abdominal pain, diarrhea, pain in joints. Patient has been ambulating with difficulty and unsteady. In the ED he was afebrile but confused. Blood work showed normal WBC, sodium of 129, potassium of 3.3, chloride of 90, BUN of 92 and creatinine 1.67. Glucose of 216 and elevated ammonia of 175. He had elevated total bilirubin with normal AST/ALT and elevated alkaline phosphatase. Lactic acid was normal. UA was suggestive of UTI. Chest x-ray was unremarkable. CT of the abdomen and pelvis showed cirrhotic liver with small ascites and right pleural effusion.  Patient admitted for acute metabolic encephalopathy secondary to UTI and hepatic encephalopathy.   Assessment & Plan:   Principal Problem:   Metabolic encephalopathy Active Problems:   CHF (congestive heart failure) (HCC)   DM II (diabetes mellitus, type II), controlled (HCC)   Atrial fibrillation (HCC)   Type 2 diabetes with complication (HCC)   UTI (urinary tract infection)   Hepatic encephalopathy (HCC)   Fall   Elevated bilirubin   Urinary tract infection with hematuria   Acute metabolic  encephalopathy -Secondary to UTI and hepatic encephalopathy.  --Patient with clinical improvement. Urine cultures positive for Enterococcus faecalis which is sensitive to the penicillins, quinolones and vancomycin. IV Rocephin has been transitioned to oral amoxicillin. Continue lactulose and Xifaxan.  -Mental status improving per wife at the bedside--close to baseline - repeat ammonia this evening  Enterococcus UTI -Patient was on IV Rocephin and subsequently transitioned to oral amoxicillin to complete course of antibiotic treatment for Enterococcus faecalis UTI.  Hepatic encephalopathy -Patient has liver cirrhosis likely secondary to chronic hepatic congestion from the patient's pulmonary hypertension -Ammonia 175 at the time of admission -Repeat ammonia level at 80 on 10/12/2016. Patient with clinical improvement. -Continue lactulose--patient had been refusing intermittently -Discussed with the patient importance of taking lactulose Continue Xifaxan. - will repeat ammonia level this evening  Active Problems: Cirrhosis of liver -Secondary to chronic hepatic congestion from pulmonary hypertension and likely cor pulmonale -Hepatitis viral serologies negative -Continue spironolactone, Coreg, Xifaxan, lactulose -restarting lasix  Chronic systolic congestive heart failure -Euvolemic clinically -Admission weight 135 pounds -holding lasix due to increasing serum creatinine  and borderline BP -Continue carvedilol and spironolactone -Current weight is 146 pounds - will likely need to restart lasix  Acute on chronic renal failure--CKD stage III -Baseline creatinine 1.4-1.6 - Blood pressure improved - Cr slightly increased today - will restart Lasix  DM II (diabetes mellitus, type II), controlled (HCC) -07/11/2016, A1c 7.2. CBGs this morning was 45. Blood glucose levels improved with juice and food. We'll decrease NovoLog 70/30 to 7 units twice daily.  -Continue NovoLog  sliding scale  Atrial fibrillation (HCC) Rate controlled.  -Continue beta blocker.  -Not an anticoagulation due to history of?  GI bleed and nonadherence to medication.  Hyponatremia -Chronic ranging 129-132, likely due to CHF and cirrhosis. Continue spironolactone. Sodium level stable now have improved and currently at 126-127 at baseline.  Patient does not look significantly volume overloaded on examination.  BP better today 20mg  PO Lasix given today Redraw BMP now Will likely need repeat lasix dose and restarting on home dose  Recent fall with unsteady gait. Ct head negative. Needs skilled nursing facility.  Hypokalemia Repleted.    DVT prophylaxis: Heparin Code Status: Full Family Communication: wife is bedside Disposition Plan: SNF when sodium increases and CBGs stabilized   Consultants:   None  Procedures:   CT head 10/09/2016  CT abdomen and pelvis 10/07/2016  Chest x-ray 10/07/2016  Antimicrobials:   Amoxicillin 10/10/2016  IV Rocephin 10/07/2016>> 10/10/16    Subjective: Patient seen and evaluated, his wife is bedside.  He says he feels better today.  Wife states that his sodium is always low and they always seem to be trying to increase it.    Objective: Vitals:   10/15/16 2114 10/16/16 0452 10/16/16 0554 10/16/16 1411  BP: 100/86 111/76  122/74  Pulse: 77 79  74  Resp: 18 16  16   Temp: 97.6 F (36.4 C) 97.7 F (36.5 C)  97.8 F (36.6 C)  TempSrc: Oral Oral  Oral  SpO2: 95% 99%  97%  Weight:   66.4 kg (146 lb 6.4 oz)   Height:        Intake/Output Summary (Last 24 hours) at 10/16/16 1422 Last data filed at 10/16/16 1412  Gross per 24 hour  Intake             1920 ml  Output              325 ml  Net             1595 ml   Filed Weights   10/14/16 1500 10/15/16 0501 10/16/16 0554  Weight: 64.1 kg (141 lb 4.8 oz) 64.1 kg (141 lb 5 oz) 66.4 kg (146 lb 6.4 oz)    Examination:  General exam: Appears calm and comfortable   Respiratory system: Clear to auscultation. Respiratory effort normal. Cardiovascular system: S1 & S2 heard, RRR. III/VI systolic ejection murmur; no JVD, rubs, gallops or clicks. No pedal edema. Gastrointestinal system: Abdomen is nondistended, soft and nontender. No organomegaly. Umbilical hernia reducible and nontender Normal bowel sounds heard. Central nervous system: Alert and oriented. No focal neurological deficits. Extremities: Symmetric 5 x 5 power. Skin: No rashes, lesions or ulcers Psychiatry: Judgement and insight appear normal. Mood & affect appropriate.     Data Reviewed: I have personally reviewed following labs and imaging studies  CBC:  Recent Labs Lab 10/11/16 0838 10/12/16 0454 10/16/16 0423  WBC 7.2 7.0 9.2  NEUTROABS 6.0  --   --   HGB 13.0 12.1* 12.8*  HCT 36.3* 34.8* 36.6*  MCV 85.8 87.2 88.8  PLT 138* 132* 180   Basic Metabolic Panel:  Recent Labs Lab 10/10/16 0411 10/11/16 0414 10/12/16 0454 10/14/16 0410 10/15/16 0923 10/16/16 0423  NA 126* 126* 129* 127* 126* 125*  K 3.5 3.5 3.5 3.8 4.0 4.1  CL 92* 92* 96* 97* 97* 97*  CO2 20* 22 23 21* 20* 18*  GLUCOSE 123* 217* 127* 98 128* 120*  BUN 90* 94* 80* 67* 67* 72*  CREATININE 1.84* 1.57* 1.47* 1.37* 1.46* 1.58*  CALCIUM 9.4 9.6 9.2 8.9 8.5* 8.4*  MG 2.1  --   --   --   --   --  GFR: Estimated Creatinine Clearance: 34.6 mL/min (by C-G formula based on SCr of 1.58 mg/dL (H)). Liver Function Tests:  Recent Labs Lab 10/11/16 0414 10/12/16 0454  AST 32 32  ALT 20 18  ALKPHOS 138* 126  BILITOT 3.5* 3.3*  PROT 8.0 7.6  ALBUMIN 3.0* 3.0*   No results for input(s): LIPASE, AMYLASE in the last 168 hours.  Recent Labs Lab 10/10/16 0411 10/11/16 0414 10/12/16 0454  AMMONIA 87* 97* 80*   Coagulation Profile: No results for input(s): INR, PROTIME in the last 168 hours. Cardiac Enzymes: No results for input(s): CKTOTAL, CKMB, CKMBINDEX, TROPONINI in the last 168 hours. BNP (last 3  results) No results for input(s): PROBNP in the last 8760 hours. HbA1C: No results for input(s): HGBA1C in the last 72 hours. CBG:  Recent Labs Lab 10/15/16 1137 10/15/16 1702 10/15/16 2117 10/16/16 0738 10/16/16 1230  GLUCAP 97 121* 160* 108* 125*   Lipid Profile: No results for input(s): CHOL, HDL, LDLCALC, TRIG, CHOLHDL, LDLDIRECT in the last 72 hours. Thyroid Function Tests: No results for input(s): TSH, T4TOTAL, FREET4, T3FREE, THYROIDAB in the last 72 hours. Anemia Panel: No results for input(s): VITAMINB12, FOLATE, FERRITIN, TIBC, IRON, RETICCTPCT in the last 72 hours. Sepsis Labs: No results for input(s): PROCALCITON, LATICACIDVEN in the last 168 hours.  Recent Results (from the past 240 hour(s))  Culture, Urine     Status: Abnormal   Collection Time: 10/07/16  3:20 PM  Result Value Ref Range Status   Specimen Description URINE, RANDOM  Final   Special Requests NONE  Final   Culture >=100,000 COLONIES/mL ENTEROCOCCUS FAECALIS (A)  Final   Report Status 10/10/2016 FINAL  Final   Organism ID, Bacteria ENTEROCOCCUS FAECALIS (A)  Final      Susceptibility   Enterococcus faecalis - MIC*    AMPICILLIN <=2 SENSITIVE Sensitive     LEVOFLOXACIN 1 SENSITIVE Sensitive     NITROFURANTOIN <=16 SENSITIVE Sensitive     VANCOMYCIN 2 SENSITIVE Sensitive     * >=100,000 COLONIES/mL ENTEROCOCCUS FAECALIS  Blood culture (routine x 2)     Status: None   Collection Time: 10/07/16  5:05 PM  Result Value Ref Range Status   Specimen Description BLOOD LEFT ANTECUBITAL  Final   Special Requests BOTTLES DRAWN AEROBIC AND ANAEROBIC 5 CC EACH  Final   Culture   Final    NO GROWTH 5 DAYS [facility]    Report Status 10/12/2016 FINAL  Final  Blood culture (routine x 2)     Status: None   Collection Time: 10/07/16  5:05 PM  Result Value Ref Range Status   Specimen Description BLOOD RIGHT ARM  Final   Special Requests BOTTLES DRAWN AEROBIC AND ANAEROBIC 5 CC EACH   Final   Culture   Final    NO GROWTH 5 DAYS [facility]    Report Status 10/12/2016 FINAL  Final         Radiology Studies: No results found.      Scheduled Meds: . amoxicillin  500 mg Oral Q8H  . carvedilol  3.125 mg Oral BID WC  . furosemide  20 mg Oral BID  . heparin  5,000 Units Subcutaneous Q8H  . insulin aspart  0-15 Units Subcutaneous TID WC  . insulin aspart  0-5 Units Subcutaneous QHS  . insulin aspart protamine- aspart  6 Units Subcutaneous BID WC  . lactulose  20 g Oral TID  . rifaximin  550 mg Oral  BID  . spironolactone  25 mg Oral Daily  . vitamin B-12  1,000 mcg Oral Daily  . vitamin C  500 mg Oral Daily   Continuous Infusions:   LOS: 8 days    Time spent: 35 minutes    Katrinka Blazing, MD [facility] Pager [phone]  If 7PM-7AM, please contact night-coverage [email or web address] Password [email or web address] 10/16/2016, 2:22 PM

## 2016-10-17 DIAGNOSIS — L89510 Pressure ulcer of right ankle, unstageable: Secondary | ICD-10-CM | POA: Diagnosis not present

## 2016-10-17 DIAGNOSIS — N390 Urinary tract infection, site not specified: Secondary | ICD-10-CM | POA: Diagnosis not present

## 2016-10-17 DIAGNOSIS — N183 Chronic kidney disease, stage 3 (moderate): Secondary | ICD-10-CM | POA: Diagnosis not present

## 2016-10-17 DIAGNOSIS — I429 Cardiomyopathy, unspecified: Secondary | ICD-10-CM | POA: Diagnosis not present

## 2016-10-17 DIAGNOSIS — L509 Urticaria, unspecified: Secondary | ICD-10-CM | POA: Diagnosis not present

## 2016-10-17 DIAGNOSIS — I129 Hypertensive chronic kidney disease with stage 1 through stage 4 chronic kidney disease, or unspecified chronic kidney disease: Secondary | ICD-10-CM | POA: Diagnosis not present

## 2016-10-17 DIAGNOSIS — K7290 Hepatic failure, unspecified without coma: Secondary | ICD-10-CM | POA: Diagnosis not present

## 2016-10-17 DIAGNOSIS — I480 Paroxysmal atrial fibrillation: Secondary | ICD-10-CM | POA: Diagnosis not present

## 2016-10-17 DIAGNOSIS — Z9181 History of falling: Secondary | ICD-10-CM | POA: Diagnosis not present

## 2016-10-17 DIAGNOSIS — I83028 Varicose veins of left lower extremity with ulcer other part of lower leg: Secondary | ICD-10-CM | POA: Diagnosis not present

## 2016-10-17 DIAGNOSIS — Z794 Long term (current) use of insulin: Secondary | ICD-10-CM | POA: Diagnosis not present

## 2016-10-17 DIAGNOSIS — B952 Enterococcus as the cause of diseases classified elsewhere: Secondary | ICD-10-CM | POA: Diagnosis not present

## 2016-10-17 DIAGNOSIS — M6281 Muscle weakness (generalized): Secondary | ICD-10-CM | POA: Diagnosis not present

## 2016-10-17 DIAGNOSIS — I5082 Biventricular heart failure: Secondary | ICD-10-CM | POA: Diagnosis not present

## 2016-10-17 DIAGNOSIS — E871 Hypo-osmolality and hyponatremia: Secondary | ICD-10-CM | POA: Diagnosis not present

## 2016-10-17 DIAGNOSIS — E1122 Type 2 diabetes mellitus with diabetic chronic kidney disease: Secondary | ICD-10-CM | POA: Diagnosis not present

## 2016-10-17 DIAGNOSIS — E559 Vitamin D deficiency, unspecified: Secondary | ICD-10-CM | POA: Diagnosis not present

## 2016-10-17 DIAGNOSIS — G9341 Metabolic encephalopathy: Secondary | ICD-10-CM | POA: Diagnosis not present

## 2016-10-17 DIAGNOSIS — R488 Other symbolic dysfunctions: Secondary | ICD-10-CM | POA: Diagnosis not present

## 2016-10-17 DIAGNOSIS — E118 Type 2 diabetes mellitus with unspecified complications: Secondary | ICD-10-CM | POA: Diagnosis not present

## 2016-10-17 DIAGNOSIS — I5032 Chronic diastolic (congestive) heart failure: Secondary | ICD-10-CM | POA: Diagnosis not present

## 2016-10-17 DIAGNOSIS — I5030 Unspecified diastolic (congestive) heart failure: Secondary | ICD-10-CM | POA: Diagnosis not present

## 2016-10-17 DIAGNOSIS — R2681 Unsteadiness on feet: Secondary | ICD-10-CM | POA: Diagnosis not present

## 2016-10-17 DIAGNOSIS — L89892 Pressure ulcer of other site, stage 2: Secondary | ICD-10-CM | POA: Diagnosis not present

## 2016-10-17 DIAGNOSIS — R319 Hematuria, unspecified: Secondary | ICD-10-CM | POA: Diagnosis not present

## 2016-10-17 DIAGNOSIS — K7460 Unspecified cirrhosis of liver: Secondary | ICD-10-CM | POA: Diagnosis not present

## 2016-10-17 DIAGNOSIS — I4891 Unspecified atrial fibrillation: Secondary | ICD-10-CM | POA: Diagnosis not present

## 2016-10-17 DIAGNOSIS — I2510 Atherosclerotic heart disease of native coronary artery without angina pectoris: Secondary | ICD-10-CM | POA: Diagnosis not present

## 2016-10-17 LAB — BASIC METABOLIC PANEL
Anion gap: 9 (ref 5–15)
BUN: 70 mg/dL — AB (ref 4–21)
BUN: 70 mg/dL — AB (ref 6–20)
CALCIUM: 8.4 mg/dL — AB (ref 8.9–10.3)
CO2: 19 mmol/L — ABNORMAL LOW (ref 22–32)
CREATININE: 1.5 mg/dL — AB (ref 0.61–1.24)
Chloride: 103 mmol/L (ref 101–111)
Creatinine: 1.5 mg/dL — AB (ref 0.6–1.3)
GFR, EST AFRICAN AMERICAN: 50 mL/min — AB (ref >60)
GFR, EST NON AFRICAN AMERICAN: 43 mL/min — AB (ref >60)
Glucose, Bld: 79 mg/dL (ref 65–99)
Glucose: 79 mg/dL
Potassium: 4.2 mmol/L (ref 3.4–5.3)
Potassium: 4.2 mmol/L (ref 3.4–5.3)
Potassium: 4.2 mmol/L (ref 3.5–5.1)
Sodium: 131 mmol/L — AB (ref 137–147)
Sodium: 131 mmol/L — ABNORMAL LOW (ref 135–145)

## 2016-10-17 LAB — GLUCOSE, CAPILLARY
GLUCOSE-CAPILLARY: 70 mg/dL (ref 65–99)
Glucose-Capillary: 88 mg/dL (ref 65–99)
Glucose-Capillary: 114 mg/dL — ABNORMAL HIGH (ref 65–99)

## 2016-10-17 MED ORDER — LACTULOSE 10 GM/15ML PO SOLN: 20.0000 g | Freq: Three times a day (TID) | ORAL | 0 refills | Status: DC

## 2016-10-17 MED ORDER — RIFAXIMIN 550 MG PO TABS: 550.0000 mg | ORAL_TABLET | Freq: Two times a day (BID) | ORAL | 0 refills | Status: DC

## 2016-10-17 MED ORDER — FUROSEMIDE 20 MG PO TABS: 20.0000 mg | ORAL_TABLET | Freq: Two times a day (BID) | ORAL | 0 refills | Status: AC

## 2016-10-17 MED ORDER — INSULIN NPH ISOPHANE & REGULAR (70-30) 100 UNIT/ML ~~LOC~~ SUSP: 6.0000 [IU] | Freq: Two times a day (BID) | SUBCUTANEOUS | 11 refills | Status: DC

## 2016-10-17 NOTE — Discharge Summary (Signed)
Physician Discharge Summary  [name] WUJ:[id number] DOB: 1941-08-13 DOA: 10/07/2016  PCP: [name], MD  Admit date: 10/07/2016 Discharge date: 10/17/2016  Admitted From: Home Disposition:  SNF  Recommendations for Outpatient Follow-up:  1. Follow up with PCP in 1-2 weeks 2. Please obtain BMP/CBC in two days 3. Titrate furosemide as tolerated 4. Continue current medications as prescribed 5. 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 fib not on anticoagulation due to prior history of GI bleed and nonadherence to medications,, CK D stage III, coronary artery disease, hypertension, type 2 diabetes mellitus, umbilical hernia and prior history of PE was brought to the ED by his wife with 5 days of confusion. Patient had an unwitnessed fall at home 5 days back following which he started having increased confusion. Wife also noticed that he was having foul-smelling urine. No fevers or chills, nausea, vomiting, chest pain, shortness of breath, abdominal pain, diarrhea, pain in joints. Patient has been ambulating with difficulty and unsteady. In the ED he was afebrile but confused. Blood work showed normal WBC, sodium of 129, potassium of 3.3, chloride of 90, BUN of 92 and creatinine 1.67. Glucose of 216 and elevated ammonia of 175. He had elevated total bilirubin with normal AST/ALT and elevated alkaline phosphatase. Lactic acid was normal. UA was suggestive of UTI. Chest x-ray was unremarkable. CT of the abdomen and pelvis showed cirrhotic liver with small ascites and right pleural effusion.  Patient admitted for acute metabolic encephalopathy  secondary to UTI and hepatic encephalopathy.  He was started on rifaximin and lactulose and his ammonia level decreased (at 36 night before discharge).  He was found to have an UTI with Enterococcus and was treated with ceftriaxone which was transitioned to Amoxicillin.  He completely his treatment course on day of discharge.  His sodium level fluctuated during hospitalization as did his CBG's.   He insulin regimen was adjusted and his blood sugar levels remained stable.  His sodium level increased to 131 on day of discharge (baseline is low 130s).  He will need repeat BMP in 2 days at facility.  Discharge Diagnoses:  Principal Problem:   Metabolic encephalopathy Active Problems:   CHF (congestive heart failure) (HCC)   DM II (diabetes mellitus, type II), controlled (HCC)   Atrial fibrillation (HCC)   Type 2 diabetes with complication (HCC)   UTI (urinary tract infection)   Hepatic encephalopathy (HCC)   Fall   Elevated bilirubin   Urinary tract infection with hematuria    Discharge Instructions  Discharge Instructions    Call MD for:  difficulty breathing, headache or visual disturbances    Complete by:  As directed    Call MD for:  extreme fatigue    Complete by:  As directed    Call MD for:  hives    Complete by:  As directed    Call MD for:  persistant dizziness or light-headedness    Complete by:  As directed    Call MD for:  persistant nausea and vomiting    Complete by:  As directed    Call MD for:  severe uncontrolled pain    Complete by:  As directed    Call MD for:  temperature >100.4  Complete by:  As directed    Diet - low sodium heart healthy    Complete by:  As directed    Discharge instructions    Complete by:  As directed    BMP in 2 days Continue current medications as prescribed CBG's ACHS   Increase activity slowly    Complete by:  As directed    Walk with assistance    Complete by:  As directed      Allergies as of 10/17/2016      Reactions    Adhesive [tape] Other (See Comments)   Reaction:  Pulls pts skin off    Sulfa Antibiotics Rash      Medication List    TAKE these medications   calcium carbonate 1500 (600 Ca) MG Tabs tablet Commonly known as:  OSCAL Take 600 mg of elemental calcium by mouth daily.   carvedilol 3.125 MG tablet Commonly known as:  COREG Take 1 tablet (3.125 mg total) by mouth 2 (two) times daily with a meal.   cholecalciferol 1000 units tablet Commonly known as:  VITAMIN D Take 1,000 Units by mouth daily.   furosemide 20 MG tablet Commonly known as:  LASIX Take 1 tablet (20 mg total) by mouth 2 (two) times daily. What changed:  medication strength  how much to take  when to take this   insulin NPH-regular Human (70-30) 100 UNIT/ML injection Commonly known as:  NOVOLIN 70/30 Inject 6 Units into the skin 2 (two) times daily with a meal. What changed:  how much to take  when to take this  reasons to take this  additional instructions   lactulose 10 GM/15ML solution Commonly known as:  CHRONULAC Take 30 mLs (20 g total) by mouth 3 (three) times daily.   magnesium oxide 400 (241.3 Mg) MG tablet Commonly known as:  MAG-OX Take 400 mg by mouth daily.   metolazone 5 MG tablet Commonly known as:  ZAROXOLYN Take 5 mg by mouth daily.   potassium chloride SA 20 MEQ tablet Commonly known as:  K-DUR,KLOR-CON Take 20 mEq by mouth 3 (three) times daily.   rifaximin 550 MG Tabs tablet Commonly known as:  XIFAXAN Take 1 tablet (550 mg total) by mouth 2 (two) times daily.   spironolactone 25 MG tablet Commonly known as:  ALDACTONE Take 25 mg by mouth daily.   vitamin B-12 1000 MCG tablet Commonly known as:  CYANOCOBALAMIN Take 1,000 mcg by mouth daily.   vitamin C 500 MG tablet Commonly known as:  ASCORBIC ACID Take 500 mg by mouth daily.            Durable Medical Equipment        Start     Ordered   10/12/16 0848  For home use only DME 4 wheeled rolling walker with  seat  Once    Question:  Patient needs a walker to treat with the following condition  Answer:  Physical debility   10/12/16 0848      Allergies  Allergen Reactions  . Adhesive [Tape] Other (See Comments)    Reaction:  Pulls pts skin off   . Sulfa Antibiotics Rash    Consultations:  None   Procedures/Studies: Dg Chest 2 View  Result Date: 10/07/2016 CLINICAL DATA:  Weakness, recent falls EXAM: CHEST  2 VIEW COMPARISON:  03/27/2016 FINDINGS: Cardiac shadow is enlarged. Postsurgical changes are again seen. The lungs are well aerated bilaterally with mild central vascular congestion. No focal infiltrate or sizable effusion is  seen. No bony abnormality is noted. IMPRESSION: Mild vascular congestion. Electronically Signed   By: Alcide Clever M.D.   On: 10/07/2016 15:53   Ct Head Wo Contrast  Result Date: 10/09/2016 CLINICAL DATA:  Recent fall.  Unsteady gait. EXAM: CT HEAD WITHOUT CONTRAST TECHNIQUE: Contiguous axial images were obtained from the base of the skull through the vertex without intravenous contrast. COMPARISON:  None. FINDINGS: Brain: Cortical atrophy and chronic microvascular ischemic change are identified. No acute intracranial abnormality including hemorrhage, infarct, mass lesion, mass effect, midline shift or abnormal extra-axial fluid collection. No hydrocephalus or pneumocephalus. Vascular: Extensive atherosclerotic vascular disease is present. Skull: Intact.  No focal lesion. Sinuses/Orbits: Negative. Other: Scalp calcifications on the left are incidentally noted and likely due to prior infectious or inflammatory process. The patient also has a cystic lesion in the scalp over the left side of the cranium near the vertex most consistent with a sebaceous cyst. IMPRESSION: No acute abnormality. Atrophy and chronic microvascular ischemic change. Atherosclerosis. Electronically Signed   By: Drusilla Kanner M.D.   On: 10/09/2016 15:37   Ct Abdomen Pelvis W Contrast  Result  Date: 10/07/2016 CLINICAL DATA:  Status post fall 5 days ago. Increasing weakness over the past 2 weeks. EXAM: CT ABDOMEN AND PELVIS WITH CONTRAST TECHNIQUE: Multidetector CT imaging of the abdomen and pelvis was performed using the standard protocol following bolus administration of intravenous contrast. CONTRAST:  75 ml ISOVUE-300 IOPAMIDOL (ISOVUE-300) INJECTION 61% COMPARISON:  None. FINDINGS: Lower chest: Trace right pleural effusion is noted. No left pleural effusion or pericardial effusion. Calcific coronary artery disease is seen. Gynecomastia is identified. Mild dependent atelectasis is seen in the right base. Hepatobiliary: The liver is shrunken with a nodular border consistent with cirrhosis. The portal vein is patent with laminar flow on early images seen in the SMV and portal veins. No focal liver lesion. Several small gallstones are identified. Pancreas: Unremarkable. No pancreatic ductal dilatation or surrounding inflammatory changes. Spleen: Normal in size without focal abnormality. Adrenals/Urinary Tract: Small low attenuating lesions in the right kidney cannot be definitively characterized but are likely cysts. Scarring is noted in the upper pole of the right kidney. The adrenal glands appear normal. Urinary bladder is distended but otherwise unremarkable. Stomach/Bowel: Colonic diverticula without diverticulitis noted the stomach and small bowel appear normal. Vascular/Lymphatic: Aortoiliac atherosclerosis without aneurysm is identified. No lymphadenopathy. Reproductive: Negative.  Penile prosthesis noted. Other: Small volume of abdominal ascites is seen.  No hernia. Musculoskeletal: Scoliosis and multilevel spondylosis are identified. Multiple Schmorl's nodes are seen. No worrisome lesion is identified. IMPRESSION: Cirrhotic liver with associated small volume of abdominal ascites. Trace right pleural effusion. Cardiomegaly. Calcific aortic and coronary atherosclerosis. Gallstones without  cholecystitis. Diverticulosis without diverticulitis. Gynecomastia. Electronically Signed   By: Drusilla Kanner M.D.   On: 10/07/2016 19:00      Subjective: Patient is doing well this morning.  Says he ate breakfast without problem and is hopeful he can discharge to facility today.  Wife is not bedside this morning.  Discharge Exam: Vitals:   10/16/16 2036 10/17/16 0419  BP: 118/70 118/74  Pulse: 68 66  Resp: 15 15  Temp: 97.6 F (36.4 C) 97.4 F (36.3 C)   Vitals:   10/16/16 0554 10/16/16 1411 10/16/16 2036 10/17/16 0419  BP:  122/74 118/70 118/74  Pulse:  74 68 66  Resp:  16 15 15   Temp:  97.8 F (36.6 C) 97.6 F (36.4 C) 97.4 F (36.3 C)  TempSrc:  Oral Oral  Oral  SpO2:  97% 98% 98%  Weight: 66.4 kg (146 lb 6.4 oz)   66.5 kg (146 lb 9.7 oz)  Height:        General: Pt is alert, awake, not in acute distress Cardiovascular: RRR, S1/S2 +, no rubs, no gallops Respiratory: CTA bilaterally, no wheezing, no rhonchi Abdominal: Soft, NT, ND, bowel sounds + Extremities: no edema, no cyanosis, various ecchymoses on arms and legs bilaterally    The results of significant diagnostics from this hospitalization (including imaging, microbiology, ancillary and laboratory) are listed below for reference.     Microbiology: Recent Results (from the past 240 hour(s))  Culture, Urine     Status: Abnormal   Collection Time: 10/07/16  3:20 PM  Result Value Ref Range Status   Specimen Description URINE, RANDOM  Final   Special Requests NONE  Final   Culture >=100,000 COLONIES/mL ENTEROCOCCUS FAECALIS (A)  Final   Report Status 10/10/2016 FINAL  Final   Organism ID, Bacteria ENTEROCOCCUS FAECALIS (A)  Final      Susceptibility   Enterococcus faecalis - MIC*    AMPICILLIN <=2 SENSITIVE Sensitive     LEVOFLOXACIN 1 SENSITIVE Sensitive     NITROFURANTOIN <=16 SENSITIVE Sensitive     VANCOMYCIN 2 SENSITIVE Sensitive     * >=100,000 COLONIES/mL ENTEROCOCCUS FAECALIS  Blood culture  (routine x 2)     Status: None   Collection Time: 10/07/16  5:05 PM  Result Value Ref Range Status   Specimen Description BLOOD LEFT ANTECUBITAL  Final   Special Requests BOTTLES DRAWN AEROBIC AND ANAEROBIC 5 CC EACH  Final   Culture   Final    NO GROWTH 5 DAYS Performed at [facility]    Report Status 10/12/2016 FINAL  Final  Blood culture (routine x 2)     Status: None   Collection Time: 10/07/16  5:05 PM  Result Value Ref Range Status   Specimen Description BLOOD RIGHT ARM  Final   Special Requests BOTTLES DRAWN AEROBIC AND ANAEROBIC 5 CC EACH  Final   Culture   Final    NO GROWTH 5 DAYS Performed at [facility]    Report Status 10/12/2016 FINAL  Final     Labs: BNP (last 3 results)  Recent Labs  02/13/16 2317 03/27/16 1900  BNP 866.6* 921.0*   Basic Metabolic Panel:  Recent Labs Lab 10/14/16 0410 10/15/16 0923 10/16/16 0423 10/16/16 1506 10/17/16 0409  NA 127* 126* 125* 133* 131*  K 3.8 4.0 4.1 4.1 4.2  CL 97* 97* 97* 104 103  CO2 21* 20* 18* 18* 19*  GLUCOSE 98 128* 120* 200* 79  BUN 67* 67* 72* 71* 70*  CREATININE 1.37* 1.46* 1.58* 1.55* 1.50*  CALCIUM 8.9 8.5* 8.4* 8.5* 8.4*   Liver Function Tests:  Recent Labs Lab 10/11/16 0414 10/12/16 0454  AST 32 32  ALT 20 18  ALKPHOS 138* 126  BILITOT 3.5* 3.3*  PROT 8.0 7.6  ALBUMIN 3.0* 3.0*   No results for input(s): LIPASE, AMYLASE in the last 168 hours.  Recent Labs Lab 10/11/16 0414 10/12/16 0454 10/16/16 1800  AMMONIA 97* 80* 36*   CBC:  Recent Labs Lab 10/11/16 0838 10/12/16 0454 10/16/16 0423  WBC 7.2 7.0 9.2  NEUTROABS 6.0  --   --   HGB 13.0 12.1* 12.8*  HCT 36.3* 34.8* 36.6*  MCV 85.8 87.2 88.8  PLT 138* 132* 180   Cardiac Enzymes: No results for input(s): CKTOTAL, CKMB, CKMBINDEX, TROPONINI  in the last 168 hours. BNP: Invalid input(s): POCBNP CBG:  Recent Labs Lab 10/16/16 1702 10/16/16 2023 10/17/16 0804 10/17/16 0901 10/17/16 1213  GLUCAP 198*  219* 70 88 114*   D-Dimer No results for input(s): DDIMER in the last 72 hours. Hgb A1c No results for input(s): HGBA1C in the last 72 hours. Lipid Profile No results for input(s): CHOL, HDL, LDLCALC, TRIG, CHOLHDL, LDLDIRECT in the last 72 hours. Thyroid function studies No results for input(s): TSH, T4TOTAL, T3FREE, THYROIDAB in the last 72 hours.  Invalid input(s): FREET3 Anemia work up No results for input(s): VITAMINB12, FOLATE, FERRITIN, TIBC, IRON, RETICCTPCT in the last 72 hours. Urinalysis    Component Value Date/Time   COLORURINE YELLOW 10/07/2016 1520   APPEARANCEUR TURBID (A) 10/07/2016 1520   LABSPEC 1.008 10/07/2016 1520   PHURINE 7.0 10/07/2016 1520   GLUCOSEU NEGATIVE 10/07/2016 1520   HGBUR SMALL (A) 10/07/2016 1520   BILIRUBINUR NEGATIVE 10/07/2016 1520   BILIRUBINUR neg 02/21/2015 1456   KETONESUR NEGATIVE 10/07/2016 1520   PROTEINUR NEGATIVE 10/07/2016 1520   UROBILINOGEN 0.2 02/21/2015 1456   NITRITE NEGATIVE 10/07/2016 1520   LEUKOCYTESUR LARGE (A) 10/07/2016 1520   Sepsis Labs Invalid input(s): PROCALCITONIN,  WBC,  LACTICIDVEN Microbiology Recent Results (from the past 240 hour(s))  Culture, Urine     Status: Abnormal   Collection Time: 10/07/16  3:20 PM  Result Value Ref Range Status   Specimen Description URINE, RANDOM  Final   Special Requests NONE  Final   Culture >=100,000 COLONIES/mL ENTEROCOCCUS FAECALIS (A)  Final   Report Status 10/10/2016 FINAL  Final   Organism ID, Bacteria ENTEROCOCCUS FAECALIS (A)  Final      Susceptibility   Enterococcus faecalis - MIC*    AMPICILLIN <=2 SENSITIVE Sensitive     LEVOFLOXACIN 1 SENSITIVE Sensitive     NITROFURANTOIN <=16 SENSITIVE Sensitive     VANCOMYCIN 2 SENSITIVE Sensitive     * >=100,000 COLONIES/mL ENTEROCOCCUS FAECALIS  Blood culture (routine x 2)     Status: None   Collection Time: 10/07/16  5:05 PM  Result Value Ref Range Status   Specimen Description BLOOD LEFT ANTECUBITAL  Final    Special Requests BOTTLES DRAWN AEROBIC AND ANAEROBIC 5 CC EACH  Final   Culture   Final    NO GROWTH 5 DAYS Performed at [facility]    Report Status 10/12/2016 FINAL  Final  Blood culture (routine x 2)     Status: None   Collection Time: 10/07/16  5:05 PM  Result Value Ref Range Status   Specimen Description BLOOD RIGHT ARM  Final   Special Requests BOTTLES DRAWN AEROBIC AND ANAEROBIC 5 CC EACH  Final   Culture   Final    NO GROWTH 5 DAYS Performed at [facility]    Report Status 10/12/2016 FINAL  Final     Time coordinating discharge: Less than 30 minutes  SIGNED:   Katrinka Blazing, MD  [facility] 10/17/2016, 1:15 PM Pager [phone] If 7PM-7AM, please contact night-coverage [email or web address] Password [email or web address]

## 2016-10-17 NOTE — Progress Notes (Signed)
Patient discharge to [facility] with wife, discharge paperwork faxed and sent to facility with wife.

## 2016-10-17 NOTE — Progress Notes (Signed)
Physical Therapy Treatment Patient Details Name: [name] MRN: [id number] DOB: [birth year] Today's Date: 10/17/2016    History of Present Illness [name] is a 75 y.o. male with medical history significant of CHF presumed diastolic, Afib presenting with 5 days of confusion. Pt had fall around that time.  Dx of hepatic encephalopathy, UTI, ARF.     PT Comments    Pt appears to require increased assistance today compared to last session. Noted scissoring throughout ambulation distance. Also noted L hand swelling. Pt participated well with session. Recommend SNF for continued therapy.   Follow Up Recommendations  SNF     Equipment Recommendations   (TBD at next venue)    Recommendations for Other Services       Precautions / Restrictions Precautions Precautions: Fall Restrictions Weight Bearing Restrictions: No    Mobility  Bed Mobility Overal bed mobility: Needs Assistance Bed Mobility: Supine to Sit   [facility];Min assist       General bed mobility comments: Assist for trunk. Multimodal cues  Transfers   Equipment used: Rolling walker (2 wheeled) Transfers: Sit to/from Stand Sit to Stand: Min assist         General transfer comment: VCs hand placement. Assisst to rise, stabilize, control descent.   Ambulation/Gait Ambulation/Gait assistance: Min assist Ambulation Distance (Feet): 75 Feet   Gait Pattern/deviations: Narrow base of support;Scissoring;Step-through pattern;Decreased stride length     General Gait Details: cues for BOS. Assist to stabilize pt and maneuver safely with RW. Noted scissoring/very narrow BOS throughout ambulation.    Stairs            Wheelchair Mobility    Modified Rankin (Stroke Patients Only)       Balance Overall balance assessment: Needs assistance         Standing balance support: Bilateral upper extremity supported Standing balance-Leahy Scale: Poor                      Cognition  Arousal/Alertness: Awake/alert Behavior During Therapy: Flat affect Overall Cognitive Status: Difficult to assess (very little verbal responses) Area of Impairment: Safety/judgement;Problem solving         Safety/Judgement: Decreased awareness of deficits;Decreased awareness of safety   Problem Solving: Slow processing;Requires verbal cues;Requires tactile cues      Exercises      General Comments        Pertinent Vitals/Pain Pain Assessment: Faces Faces Pain Scale: Hurts little more Pain Location: L hand (noted swelling as well) Pain Descriptors / Indicators: Sore Pain Intervention(s): Limited activity within patient's tolerance    Home Living                      Prior Function            PT Goals (current goals can now be found in the care plan section) Progress towards PT goals: Progressing toward goals    Frequency    Min 3X/week      PT Plan Current plan remains appropriate    Co-evaluation             End of Session Equipment Utilized During Treatment: Gait belt Activity Tolerance: Patient tolerated treatment well Patient left: in chair;with call bell/phone within reach;with family/visitor present;with chair alarm set     Time: [id number] PT Time Calculation (min) (ACUTE ONLY): 13 min  Charges:  $Gait Training: 8-22 mins  G Codes:      Weston Anna, MPT Pager: [phone]

## 2016-10-17 NOTE — Clinical Social Work Placement (Signed)
   CLINICAL SOCIAL WORK PLACEMENT  NOTE  Date:  10/17/2016  Patient Details  Name: [name] MRN: [id number] Date of Birth: 08-06-1941  Clinical Social Work is seeking post-discharge placement for this patient at the Skilled  Nursing Facility level of care (*CSW will initial, date and re-position this form in  chart as items are completed):  Yes   Patient/family provided with [place] Clinical Social Work Department's list of facilities offering this level of care within the geographic area requested by the patient (or if unable, by the patient's family).  Yes   Patient/family informed of their freedom to choose among providers that offer the needed level of care, that participate in Medicare, Medicaid or managed care program needed by the patient, have an available bed and are willing to accept the patient.  Yes   Patient/family informed of [place]'s ownership interest in [facility] and [facility], as well as of the fact that they are under no obligation to receive care at these facilities.  PASRR submitted to EDS on 10/13/16     PASRR number received on 10/13/16     Existing PASRR number confirmed on       FL2 transmitted to all facilities in geographic area requested by pt/family on 10/13/16     FL2 transmitted to all facilities within larger geographic area on       Patient informed that his/her managed care company has contracts with or will negotiate with certain facilities, including the following:        Yes   Patient/family informed of bed offers received.  Patient chooses bed at [facility]     Physician recommends and patient chooses bed at      Patient to be [facility] on 10/17/16.  Patient to be transferred to facility by car     Patient family notified on 10/17/16 of transfer.  Name of family member notified:  spouse     PHYSICIAN       Additional Comment: Pt / spouse are in agreement  with [facility]. Pt requesting to transport by car. D/C Summary sent to SNF for review. Scripts included in d/c packet. Health Team Adv provided prior authorization. # for report provided to nsg.   _______________________________________________ [name], [name], LCSW  [phone] 10/17/2016, 2:02 PM

## 2016-10-18 ENCOUNTER — Encounter: Payer: Self-pay | Admitting: Internal Medicine

## 2016-10-18 ENCOUNTER — Non-Acute Institutional Stay (SKILLED_NURSING_FACILITY): Payer: PPO | Admitting: Internal Medicine

## 2016-10-18 DIAGNOSIS — I480 Paroxysmal atrial fibrillation: Secondary | ICD-10-CM | POA: Diagnosis not present

## 2016-10-18 DIAGNOSIS — K7682 Hepatic encephalopathy: Secondary | ICD-10-CM

## 2016-10-18 DIAGNOSIS — I5082 Biventricular heart failure: Secondary | ICD-10-CM | POA: Diagnosis not present

## 2016-10-18 DIAGNOSIS — N390 Urinary tract infection, site not specified: Secondary | ICD-10-CM | POA: Diagnosis not present

## 2016-10-18 DIAGNOSIS — K7290 Hepatic failure, unspecified without coma: Secondary | ICD-10-CM | POA: Diagnosis not present

## 2016-10-18 DIAGNOSIS — E871 Hypo-osmolality and hyponatremia: Secondary | ICD-10-CM | POA: Diagnosis not present

## 2016-10-18 DIAGNOSIS — B952 Enterococcus as the cause of diseases classified elsewhere: Secondary | ICD-10-CM

## 2016-10-18 DIAGNOSIS — G9341 Metabolic encephalopathy: Secondary | ICD-10-CM

## 2016-10-18 DIAGNOSIS — Z794 Long term (current) use of insulin: Secondary | ICD-10-CM

## 2016-10-18 DIAGNOSIS — E559 Vitamin D deficiency, unspecified: Secondary | ICD-10-CM

## 2016-10-18 DIAGNOSIS — E118 Type 2 diabetes mellitus with unspecified complications: Secondary | ICD-10-CM | POA: Diagnosis not present

## 2016-10-18 DIAGNOSIS — K7460 Unspecified cirrhosis of liver: Secondary | ICD-10-CM | POA: Diagnosis not present

## 2016-10-18 DIAGNOSIS — R188 Other ascites: Secondary | ICD-10-CM

## 2016-10-18 LAB — CBC AND DIFFERENTIAL
HEMATOCRIT: 40 % — AB (ref 41–53)
HEMOGLOBIN: 13 g/dL — AB (ref 13.5–17.5)
Platelets: 190 10*3/uL (ref 150–399)
WBC: 7.7 10^3/mL

## 2016-10-18 LAB — BASIC METABOLIC PANEL
BUN: 68 mg/dL — AB (ref 4–21)
Creatinine: 1.4 mg/dL — AB (ref 0.6–1.3)
Glucose: 187 mg/dL
POTASSIUM: 5.2 mmol/L (ref 3.4–5.3)
Sodium: 126 mmol/L — AB (ref 137–147)

## 2016-10-18 NOTE — Progress Notes (Signed)
: Provider:  Randon Goldsmith. Lyn Hollingshead, MD Location:  [facility] Room Number: 414D Place of Service:  SNF (31)  PCP: [name], MD Patient Care Team: [name], MD as PCP - General (Family Medicine) Ranee Gosselin, MD as Consulting Physician (Orthopedic Surgery) Sallye Lat, MD as Consulting Physician (Ophthalmology) Manus Rudd, MD as Consulting Physician (General Surgery) Orpah Cobb, MD as Consulting Physician (Cardiology)  Extended Emergency Contact Information Primary Emergency Contact: Laumann,Nancy Address: [address]          [place] [address] [place] Phone: [phone] Relation: Spouse Secondary Emergency Contact: Salome [place] Mobile Phone: [phone] Relation: Son     Allergies: Adhesive [tape] and Sulfa antibiotics  Chief Complaint  Patient presents with  . New Admit To SNF    Admit to Facility    HPI: Patient is 75 y.o. 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 fib not on anticoagulation due to prior history of GI bleed and nonadherence to medications,, CK D stage III, coronary artery disease, hypertension, type 2 diabetes mellitus, umbilical hernia and prior history of PE was brought to the ED by his wife with 5 days of confusion. Patient had an unwitnessed fall at home 5 days back following which he started having increased confusion. Pt was admitted to [facility] from 12/18-28 for  acute metabolic encephalopathy secondary to UTI and hepatic encephalopathy with ammonia level of 175.  He was started on rifaximin and lactulose and his ammonia level decreased (at 36 night before discharge).  He was found to have an UTI with Enterococcus and was treated with ceftriaxone which was transitioned to Amoxicillin.  He completely  his treatment course on day of discharge.  His sodium level fluctuated during hospitalization as did his CBG's. He insulin regimen was adjusted and his blood sugar levels remained stable. His sodium level increased to 131 on day of discharge. Pt is admitted to SNF for OT/PT. While at SNF pt will be followed for AF, tx with coreg, CHF, tx with coreg, lasix 20 mg BID, spironolactone 25 mg daily and zaroxylin 5 mg daily and Vit D def, tx with replacement.  Past Medical History:  Diagnosis Date  . Atrial fibrillation (HCC)    ?GI bleed in past, plus pt tends to "self adjust/manage" his meds w/out regard for MD's guidance, so he has NOT been on anticoagulant for this.  . CHF (congestive heart failure) (HCC)   . Chronic bronchitis (HCC)    "used to get it often; haven't had it for awhile now" (04/12/2013)  . Chronic renal insufficiency, stage III (moderate)    GFR @ 50  . Chronic systolic heart failure (HCC)   . 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  Hospitalized with hepatic encephalopathy 09/2016.  Marland Kitchen Coronary artery disease   . Gout   . Heart murmur   . High cholesterol   . Hypertension   . Pneumonia   . Positive TB test    "but I don't have it"  . Postpolio syndrome 1951   "affected the right side of my body; been weak on that side ever since" (04/12/2013)  . 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 Dione Booze)  . Pulmonary embolism (HCC) 1980's or  1990's  . Severe mitral regurgitation 2013  . Severe tricuspid regurgitation 2013  . Subclinical hypothyroidism   . Type II diabetes mellitus (HCC)   . Umbilical hernia 02/2015   Symptomatic--Dr. Corliss Skainssuei to repair if cleared by cardiologist    Past Surgical History:  Procedure Laterality Date  . APPENDECTOMY  ~ 1962  . CARDIAC CATHETERIZATION     "I've had a few since 1989" (04/12/2013)  . CARDIAC CATHETERIZATION  12/2012    Severe native CAD, patent bypass grafts x 3, EF 25-30%--med mgmt  . CATARACT EXTRACTION W/ INTRAOCULAR LENS  IMPLANT, BILATERAL Bilateral 2005  . CERVICAL DISCECTOMY  1984   C6-7  . CORONARY ARTERY BYPASS GRAFT  01/1988; 05/1999   CABG X 3; CABG X 2  . EXPLORATORY LAPAROTOMY  ~ 1962   "took out my appendix while there; bowel was inflammed" (04/12/2013)  . HYDROCELE EXCISION / REPAIR     "couple times" (04/12/2013)  . LEFT AND RIGHT HEART CATHETERIZATION WITH CORONARY/GRAFT ANGIOGRAM  01/13/2013   Procedure: LEFT AND RIGHT HEART CATHETERIZATION WITH Isabel CapriceORONARY/GRAFT ANGIOGRAM;  Surgeon: Ricki RodriguezAjay S [name], MD;  Location: [facility] CATH LAB;  Service: Cardiovascular;;  . PARACENTESIS  2013-2014   Multiple times, usually pulling off 6-7 liters of fluid  . RETINAL LASER PROCEDURE Bilateral    "more than once on the left" (04/12/2013)  . TRANSTHORACIC ECHOCARDIOGRAM  2013;12/2014;01/2016   LV dilation, EF 30-35%, diffuse hypokinesis, grade I diast dysfxn, severe mitral and tricuspid regurg, LAE and RAE--biventricular dysfunction moderate/severe. 01/2016 dilated LV with akinesis/hypokinesis diffusely, severe RV dysfxn, severe mitral regurg, severe PA HTN    Allergies as of 10/18/2016      Reactions   Adhesive [tape] Other (See Comments)   Reaction:  Pulls pts skin off    Sulfa Antibiotics Rash      Medication List       Accurate as of 10/18/16  9:06 AM. Always use your most recent med list.          calcium carbonate 1500 (600 Ca) MG Tabs tablet Commonly known as:  OSCAL Take 600 mg of elemental calcium by mouth daily.   carvedilol 3.125 MG tablet Commonly known as:  COREG Take 1 tablet (3.125 mg total) by mouth 2 (two) times daily with a meal.   cholecalciferol 1000 units tablet Commonly known as:  VITAMIN D Take 1,000 Units by mouth daily.   furosemide 20 MG tablet Commonly known as:  LASIX Take 1 tablet (20 mg total) by mouth 2 (two) times daily.   insulin NPH-regular Human (70-30) 100  UNIT/ML injection Commonly known as:  NOVOLIN 70/30 Inject 6 Units into the skin 2 (two) times daily with a meal.   lactulose 10 GM/15ML solution Commonly known as:  CHRONULAC Take 30 mLs (20 g total) by mouth 3 (three) times daily.   magnesium oxide 400 (241.3 Mg) MG tablet Commonly known as:  MAG-OX Take 400 mg by mouth daily.   metolazone 5 MG tablet Commonly known as:  ZAROXOLYN Take 5 mg by mouth daily.   potassium chloride SA 20 MEQ tablet Commonly known as:  K-DUR,KLOR-CON Take 20 mEq by mouth 3 (three) times daily.   rifaximin 550 MG Tabs tablet Commonly known as:  XIFAXAN Take 1 tablet (550 mg total) by mouth 2 (two) times daily.   spironolactone 25 MG tablet Commonly known as:  ALDACTONE Take 25 mg by mouth daily.   vitamin B-12 1000 MCG tablet Commonly known as:  CYANOCOBALAMIN Take 1,000 mcg  by mouth daily.   vitamin C 500 MG tablet Commonly known as:  ASCORBIC ACID Take 500 mg by mouth daily.       No orders of the defined types were placed in this encounter.   Immunization History  Administered Date(s) Administered  . Tdap 06/20/2014    Social History  Substance Use Topics  . Smoking status: Former Smoker    Packs/day: 0.25    Years: 20.00    Types: Cigarettes    Quit date: 10/30/1973  . Smokeless tobacco: Former Neurosurgeon    Types: Chew    Quit date: 10/21/1969  . Alcohol use No     Comment: 04/12/2013 "have ~ 2 drinks/yr; a drink for me is ~ 0.5oz"    Family history is   Family History  Problem Relation Age of Onset  . Cancer Sister   . Parkinson's disease Brother       Review of Systems  DATA OBTAINED: from patient, nurse GENERAL:  no fevers, fatigue, appetite changes SKIN: No itching, or rash EYES: No eye pain, redness, discharge EARS: No earache, tinnitus, change in hearing NOSE: No congestion, drainage or bleeding  MOUTH/THROAT: No mouth or tooth pain, No sore throat RESPIRATORY: No cough, wheezing, SOB CARDIAC: No chest pain,  palpitations, lower extremity edema  GI: No abdominal pain, No N/V/D or constipation, No heartburn or reflux  GU: No dysuria, frequency or urgency, or incontinence  MUSCULOSKELETAL: No unrelieved bone/joint pain NEUROLOGIC: No headache, dizziness or focal weakness PSYCHIATRIC: No c/o anxiety or sadness   Vitals:   10/18/16 0847  BP: 116/71  Pulse: 77  Resp: 16  Temp: 97.5 F (36.4 C)    SpO2 Readings from Last 1 Encounters:  10/18/16 98%   Body mass index is 22.13 kg/m.     Physical Exam  GENERAL APPEARANCE: Alert, conversant,  No acute distress.  SKIN: No diaphoresis rash HEAD: Normocephalic, atraumatic  EYES: Conjunctiva/lids clear. Pupils round, reactive. EOMs intact.  EARS: External exam WNL, canals clear. Hearing grossly normal.  NOSE: No deformity or discharge.  MOUTH/THROAT: Lips w/o lesions  RESPIRATORY: Breathing is even, unlabored. Lung sounds are decreased BS R bases  CARDIOVASCULAR: Heart irreg? High pitched murmur no rubs or gallops. No peripheral edema.   GASTROINTESTINAL: Abdomen is soft, non-tender, not distended w/ normal bowel sounds. GENITOURINARY: Bladder non tender, not distended  MUSCULOSKELETAL: No abnormal joints or musculature NEUROLOGIC:  Cranial nerves 2-12 grossly intact. Moves all extremities  PSYCHIATRIC: Mood and affect appropriate to situation, no behavioral issues  Patient Active Problem List   Diagnosis Date Noted  . Elevated bilirubin   . Urinary tract infection with hematuria   . Fall   . Hepatic encephalopathy (HCC)   . Metabolic encephalopathy 10/07/2016  . UTI (urinary tract infection) 10/07/2016  . Ascites 03/27/2016  . 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 Shortness of breath 02/10/2015  . 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 Chronic renal insufficiency, stage III (moderate) 01/28/2015  . Type 2 diabetes with complication (HCC) 01/28/2015  . Acute on chronic left  systolic heart failure (HCC) 12/26/2014  . Rash and nonspecific skin eruption 06/20/2014  . Skin lesion of back 06/20/2014  . Preventative health care 06/20/2014  . Right medial knee pain 05/24/2014  . Right ankle sprain 05/24/2014  . Atrial fibrillation (HCC) 05/24/2014  . Hyperlipidemia 05/24/2014  . CHF (congestive heart failure) (HCC) 10/31/2011    Class: Chronic  . Other ascites 10/31/2011    Class:  Acute  . DM II (diabetes mellitus, type II), controlled (HCC) 10/31/2011    Class: Chronic  . 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3 (H) 10/12/2016 0454   GFRNONAA 43 (L) 10/17/2016 0409   GFRAA 50 (L) 10/17/2016 0409     Recent Labs  02/14/16 0431  10/10/16 0411  10/16/16 0423 10/16/16 1506 10/17/16 10/17/16 0409  NA 133*  < > 126*  < > 125*  125* 133*  133*  --  131*  131*  K 3.4*  < > 3.5  < > 4.1  4.1 4.1  4.1 4.2 4.2  4.2  CL 98*  < > 92*  < > 97* 104  --  103  CO2 25  < > 20*  < > 18* 18*  --  19*  GLUCOSE 149*  < > 123*  < > 120* 200*  --  79  BUN 62*  < > 90*  < > 72*  72* 71*  71*  --  70*  70*  CREATININE 1.44*  < > 1.84*  < > 1.58*  1.6* 1.55*  1.6*  --  1.50*  1.5*  CALCIUM 9.1  < > 9.4  < > 8.4* 8.5*  --  8.4*  MG 2.3  --  2.1  --   --   --   --   --   < > = values in this interval not displayed. Liver Function  Tests:  Recent Labs  10/08/16 0341 10/11/16 10/11/16 0414 10/12/16 0454  AST 34 32 32  32 32  ALT 22 18 20  20 18   ALKPHOS 144* 126* 138*  138* 126  BILITOT 4.9*  --  3.5* 3.3*  PROT 8.2*  --  8.0 7.6  ALBUMIN 3.3*  --  3.0* 3.0*    Recent Labs  10/07/16 1429  LIPASE 47    Recent Labs  10/11/16 0414 10/12/16 0454 10/16/16 1800  AMMONIA 97* 80* 36*   CBC:  Recent Labs  03/27/16 1900  07/11/16 1130  10/11/16 0838 10/12/16 0454 10/16/16 0423  WBC 6.0  < > 6.2  < > 7.2  7.2 7.0  7.0 9.2  9.2  NEUTROABS 4.2  --  4.5  --  6.0  --   --   HGB 12.8*  < > 13.4  < > 13.0  13.0* 12.1*  12.1* 12.8*  HCT 38.6*  < > 39.1  < > 36.3*  36* 34.8*  35* 36.6*  MCV 87.7  < > 89.4  < > 85.8 87.2 88.8  PLT 134*  < > 116.0*  < > 138*  138* 132*  132* 180  < > = values in this interval not displayed. Lipid  No results for input(s): CHOL, HDL, LDLCALC, TRIG in the last 8760 hours.  Cardiac Enzymes:  Recent Labs  02/13/16 2317 02/14/16 0431 02/14/16 1030  TROPONINI 0.07* 0.09* 0.08*   BNP:  Recent Labs  02/13/16 2317 03/27/16 1900  BNP 866.6* 921.0*   Lab Results  Component Value Date   MICROALBUR <0.7 07/11/2016   Lab Results  Component Value Date   HGBA1C 7.2 (H) 07/11/2016   Lab Results  Component Value Date   TSH 10.172 (H) 10/11/2016   No results found for: VITAMINB12 No results found for: FOLATE No results found for: IRON, TIBC, FERRITIN  Imaging and Procedures obtained prior to SNF admission: Dg Chest 2 View  Result Date: 10/07/2016 CLINICAL DATA:  Weakness, recent falls EXAM: CHEST  2 VIEW COMPARISON:  03/27/2016 FINDINGS: Cardiac shadow is enlarged. Postsurgical changes are again seen. The lungs are well aerated bilaterally with mild central vascular congestion. No focal infiltrate or sizable effusion is seen. No bony abnormality is noted. IMPRESSION: Mild vascular congestion. Electronically Signed   By: Alcide Clever M.D.   On: 10/07/2016  15:53   Ct Abdomen Pelvis W Contrast  Result Date: 10/07/2016 CLINICAL DATA:  Status post fall 5 days ago. Increasing weakness over the past 2 weeks. EXAM: CT ABDOMEN AND PELVIS WITH CONTRAST TECHNIQUE: Multidetector CT imaging of the abdomen and pelvis was performed using the standard protocol following bolus administration of intravenous contrast. CONTRAST:  75 ml ISOVUE-300 IOPAMIDOL (ISOVUE-300) INJECTION 61% COMPARISON:  None. FINDINGS: Lower chest: Trace right pleural effusion is noted. No left pleural effusion or pericardial effusion. Calcific coronary artery disease is seen. Gynecomastia is identified. Mild dependent atelectasis is seen in the right base. Hepatobiliary: The liver is shrunken with a nodular border consistent with cirrhosis. The portal vein is patent with laminar flow on early images seen in the SMV and portal veins. No focal liver lesion. Several small gallstones are identified. Pancreas: Unremarkable. No pancreatic ductal dilatation or surrounding inflammatory changes. Spleen: Normal in size without focal abnormality. Adrenals/Urinary Tract: Small low attenuating lesions in the right kidney cannot be definitively characterized but are likely cysts. Scarring is noted in the upper pole of the right kidney. The adrenal glands appear normal. Urinary bladder is distended but otherwise unremarkable. Stomach/Bowel: Colonic diverticula without diverticulitis noted the stomach and small bowel appear normal. Vascular/Lymphatic: Aortoiliac atherosclerosis without aneurysm is identified. No lymphadenopathy. Reproductive: Negative.  Penile prosthesis noted. Other: Small volume of abdominal ascites is seen.  No hernia. Musculoskeletal: Scoliosis and multilevel spondylosis are identified. Multiple Schmorl's nodes are seen. No worrisome lesion is identified. IMPRESSION: Cirrhotic liver with associated small volume of abdominal ascites. Trace right pleural effusion. Cardiomegaly. Calcific aortic and  coronary atherosclerosis. Gallstones without cholecystitis. Diverticulosis without diverticulitis. Gynecomastia. Electronically Signed   By: Drusilla Kanner M.D.   On: 10/07/2016 19:00     Not all labs, radiology exams or other studies done during hospitalization come through on my EPIC note; however they are reviewed by me.    Assessment and Plan  Metabolic encephlopathy/HEPATIC ENCEPHALOPATHY - 2/2 cardiac cirrhosis and ammonia level 175; pt was tx with rifaximin and lactulose  And ammonia level 36 on d/c SNF - admitted to SNF for generalized weakness; will monitor ammonia level at intervals  ENTEROCOCCUS UTI - TREATED WITH ROCEPHIN  And then amoxil; tx is completed  HYPONATREMIA/FLUCTUATIONG BS/ DM2 - insulin was adjusted nad as BS leveled off, Na= returned to baseline 131 SNF - will f/u BMP  AF  SNF -  cont coreg 3.125 BID for rate; not anti-coag 2/2 liver dx  SYSTOLIC CHF SNF - stable; cont coreg 3.125 mg BID, lasix 20 mg BID, spironolactone 25 mg daily and metolazone 5 mg daily  VITAMIN D DEF SNF - cont replacement 1000u daily   Time spent > 45 min;> 50% of time with patient was spent reviewing records, labs, tests and studies, counseling and developing plan of care  Thurston Holenne D. Lyn HollingsheadAlexander, MD

## 2016-10-19 ENCOUNTER — Encounter: Payer: Self-pay | Admitting: Internal Medicine

## 2016-10-19 DIAGNOSIS — N390 Urinary tract infection, site not specified: Secondary | ICD-10-CM

## 2016-10-19 DIAGNOSIS — B952 Enterococcus as the cause of diseases classified elsewhere: Secondary | ICD-10-CM | POA: Insufficient documentation

## 2016-10-19 DIAGNOSIS — E871 Hypo-osmolality and hyponatremia: Secondary | ICD-10-CM | POA: Insufficient documentation

## 2016-10-19 DIAGNOSIS — E559 Vitamin D deficiency, unspecified: Secondary | ICD-10-CM | POA: Insufficient documentation

## 2016-10-22 LAB — CBC AND DIFFERENTIAL
HCT: 42 % (ref 41–53)
HEMOGLOBIN: 13.5 g/dL (ref 13.5–17.5)
Platelets: 174 10*3/uL (ref 150–399)
WBC: 5.7 10^3/mL

## 2016-10-22 LAB — BASIC METABOLIC PANEL
BUN: 61 mg/dL — AB (ref 4–21)
CREATININE: 1.3 mg/dL (ref 0.6–1.3)
Glucose: 131 mg/dL
POTASSIUM: 5.6 mmol/L — AB (ref 3.4–5.3)
Sodium: 132 mmol/L — AB (ref 137–147)

## 2016-10-23 ENCOUNTER — Other Ambulatory Visit: Payer: Self-pay | Admitting: *Deleted

## 2016-10-23 NOTE — Patient Outreach (Signed)
[facility]) Care Management  10/23/2016  [name] 10-Dec-1940 [id number]   Met with [name], SW at facility. She states that the 72 hour meeting is scheduled for later this week so unsure of discharge plans at this time. Plan: will follow for any [name] program discharge needs. [name]. Praveen Coia, RN, BSN, [facility] Coord[facility] [phone]

## 2016-10-25 LAB — BASIC METABOLIC PANEL
BUN: 57 mg/dL — AB (ref 4–21)
Creatinine: 1.3 mg/dL (ref 0.6–1.3)
GLUCOSE: 154 mg/dL
POTASSIUM: 4.7 mmol/L (ref 3.4–5.3)
SODIUM: 133 mmol/L — AB (ref 137–147)

## 2016-10-30 ENCOUNTER — Encounter: Payer: Self-pay | Admitting: Internal Medicine

## 2016-10-30 ENCOUNTER — Non-Acute Institutional Stay (SKILLED_NURSING_FACILITY): Payer: PPO | Admitting: Internal Medicine

## 2016-10-30 DIAGNOSIS — K7682 Hepatic encephalopathy: Secondary | ICD-10-CM

## 2016-10-30 DIAGNOSIS — K7290 Hepatic failure, unspecified without coma: Secondary | ICD-10-CM

## 2016-10-30 DIAGNOSIS — L509 Urticaria, unspecified: Secondary | ICD-10-CM

## 2016-10-30 NOTE — Progress Notes (Signed)
[facility] Room Number: 108P Place of Service:  SNF (31)  Randon Goldsmithnne D. Lyn HollingsheadAlexander, MD  Patient Care Team: Jeoffrey MassedPhilip H McGowen, MD as PCP - General (Family Medicine) Ranee Gosselinonald Gioffre, MD as Consulting Physician (Orthopedic Surgery) Sallye Lathristopher Groat, MD as Consulting Physician (Ophthalmology) Manus RuddMatthew Tsuei, MD as Consulting Physician (General Surgery) Orpah CobbAjay [name], MD as Consulting Physician (Cardiology)  Extended Emergency Contact Information Primary Emergency Contact: Weatherbee,Nancy Address: [address]          [place] [id number] [place] Mobile Phone: [phone] Relation: Spouse Secondary Emergency Contact: Salome ArntJohnson,David Randy  [place] Mobile Phone: [phone] Relation: Son    Allergies: Adhesive [tape] and Sulfa antibiotics  Chief Complaint  Patient presents with  . Acute Visit    Acute    HPI: Patient is 76 y.o. male who nursing has asked me to see because he has a rash on his back, noted today by rehab. Pt's wife said that pt's rash was on his buttocks yesterday, then on back today. She says he had same rash while he was in the hospital. In fact, any time he is not at home he gets a rash. Wife washes his clothes here at SNF and did at hospital as well. Wife is also concerned what pt's ammonia level is. She admits he is acting a little oddly.  Past Medical History:  Diagnosis Date  . Atrial fibrillation (HCC)    ?GI bleed in past, plus pt tends to "self adjust/manage" his meds w/out regard for MD's guidance, so he has NOT been on anticoagulant for this.  . CHF (congestive heart failure) (HCC)   . Chronic bronchitis (HCC)    "used to get it often; haven't had it for awhile now" (04/12/2013)  . Chronic renal insufficiency, stage III (moderate)    GFR @ 50  . Chronic systolic heart failure (HCC)   . 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  Hospitalized with hepatic encephalopathy 09/2016.  Marland Kitchen. Coronary artery disease   . Gout   . Heart murmur   . High cholesterol   . Hypertension   . Pneumonia   . Positive TB test    "but I don't have it"  . Postpolio syndrome 1951   "affected the right side of my body; been weak on that side ever since" (04/12/2013)  . 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 Dione BoozeGroat)  . Pulmonary embolism (HCC) 1980's or 1990's  . Severe mitral regurgitation 2013  . Severe tricuspid regurgitation 2013  . Subclinical hypothyroidism   . Type II diabetes mellitus (HCC)   . Umbilical hernia 02/2015   Symptomatic--Dr. Corliss Skainssuei to repair if cleared by cardiologist    Past Surgical History:  Procedure Laterality Date  . APPENDECTOMY  ~ 1962  . CARDIAC CATHETERIZATION     "I've had a few since 1989" (04/12/2013)  . CARDIAC CATHETERIZATION  12/2012   Severe native CAD, patent bypass grafts x 3, EF 25-30%--med mgmt  . CATARACT EXTRACTION W/ INTRAOCULAR LENS  IMPLANT, BILATERAL Bilateral 2005  . CERVICAL DISCECTOMY  1984   C6-7  . CORONARY ARTERY BYPASS GRAFT  01/1988; 05/1999   CABG X 3; CABG X 2  . EXPLORATORY LAPAROTOMY  ~ 1962   "took out my appendix while there; bowel was inflammed" (04/12/2013)  . HYDROCELE EXCISION / REPAIR     "  couple times" (04/12/2013)  . LEFT AND RIGHT HEART CATHETERIZATION WITH CORONARY/GRAFT ANGIOGRAM  01/13/2013   Procedure: LEFT AND RIGHT HEART CATHETERIZATION WITH Isabel Caprice;  Surgeon: [name], MD;  Location: [facility] CATH LAB;  Service: Cardiovascular;;  . PARACENTESIS  2013-2014   Multiple times, usually pulling off 6-7 liters of fluid  . RETINAL LASER PROCEDURE Bilateral    "more than once on the left" (04/12/2013)  . TRANSTHORACIC ECHOCARDIOGRAM  2013;12/2014;01/2016   LV dilation, EF 30-35%, diffuse hypokinesis, grade I diast dysfxn, severe mitral and tricuspid regurg, LAE and RAE--biventricular  dysfunction moderate/severe. 01/2016 dilated LV with akinesis/hypokinesis diffusely, severe RV dysfxn, severe mitral regurg, severe PA HTN    Allergies as of 10/30/2016      Reactions   Adhesive [tape] Other (See Comments)   Reaction:  Pulls pts skin off    Sulfa Antibiotics Rash      Medication List       Accurate as of 10/30/16 11:09 AM. Always use your most recent med list.          calcium carbonate 1500 (600 Ca) MG Tabs tablet Commonly known as:  OSCAL Take 600 mg of elemental calcium by mouth daily.   carvedilol 3.125 MG tablet Commonly known as:  COREG Take 1 tablet (3.125 mg total) by mouth 2 (two) times daily with a meal.   cholecalciferol 1000 units tablet Commonly known as:  VITAMIN D Take 1,000 Units by mouth daily.   feeding supplement (PRO-STAT SUGAR FREE 64) Liqd Take 30 mLs by mouth 2 (two) times daily. To support wound healing   furosemide 20 MG tablet Commonly known as:  LASIX Take 1 tablet (20 mg total) by mouth 2 (two) times daily.   insulin NPH-regular Human (70-30) 100 UNIT/ML injection Commonly known as:  NOVOLIN 70/30 Inject 6 Units into the skin 2 (two) times daily with a meal.   lactulose 10 GM/15ML solution Commonly known as:  CHRONULAC Take 30 mLs (20 g total) by mouth 3 (three) times daily.   magnesium oxide 400 (241.3 Mg) MG tablet Commonly known as:  MAG-OX Take 400 mg by mouth daily.   metolazone 5 MG tablet Commonly known as:  ZAROXOLYN Take 5 mg by mouth daily.   ondansetron 4 MG tablet Commonly known as:  ZOFRAN Take 4 mg by mouth every 8 (eight) hours as needed for nausea.   potassium chloride SA 20 MEQ tablet Commonly known as:  K-DUR,KLOR-CON Take 20 mEq by mouth 3 (three) times daily.   rifaximin 550 MG Tabs tablet Commonly known as:  XIFAXAN Take 1 tablet (550 mg total) by mouth 2 (two) times daily.   spironolactone 25 MG tablet Commonly known as:  ALDACTONE Take 25 mg by mouth daily.   vitamin B-12 1000 MCG  tablet Commonly known as:  CYANOCOBALAMIN Take 1,000 mcg by mouth daily.   vitamin C 500 MG tablet Commonly known as:  ASCORBIC ACID Take 500 mg by mouth daily.       No orders of the defined types were placed in this encounter.   Immunization History  Administered Date(s) Administered  . Tdap 06/20/2014    Social History  Substance Use Topics  . Smoking status: Former Smoker    Packs/day: 0.25    Years: 20.00    Types: Cigarettes    Quit date: 10/30/1973  . Smokeless tobacco: Former Neurosurgeon    Types: Chew    Quit date: 10/21/1969  . Alcohol use No  Comment: 04/12/2013 "have ~ 2 drinks/yr; a drink for me is ~ 0.5oz"    Review of Systems  DATA OBTAINED: from patient, wife GENERAL:  no fevers, fatigue, appetite changes SKIN: + rash, not very itchy HEENT: No complaint RESPIRATORY: No cough, wheezing, SOB CARDIAC: No chest pain, palpitations, lower extremity edema  GI: No abdominal pain, No N/V/D or constipation, No heartburn or reflux  GU: No dysuria, frequency or urgency, or incontinence  MUSCULOSKELETAL: No unrelieved bone/joint pain NEUROLOGIC: No headache, dizziness  PSYCHIATRIC: No overt anxiety or sadness  Vitals:   10/30/16 1103  BP: 116/71  Pulse: 64  Resp: 16  Temp: 97.5 F (36.4 C)   Body mass index is 26.13 kg/m. Physical Exam  GENERAL APPEARANCE: Alert, conversant, No acute distress  SKIN: raised red confluent rash on L side of back; skin slt icteric HEENT: Unremarkable  RESPIRATORY: Breathing is even, unlabored. Lung sounds are clear   CARDIOVASCULAR: Heart RRR no murmurs, rubs or gallops. No peripheral edema  GASTROINTESTINAL: Abdomen is soft, non-tender, not distended w/ normal bowel sounds.  GENITOURINARY: Bladder non tender, not distended  MUSCULOSKELETAL: No abnormal joints or musculature NEUROLOGIC: Cranial nerves 2-12 grossly intact. Moves all extremities PSYCHIATRIC: Mood and affect appropriate to situation, no behavioral  issues  Patient Active Problem List   Diagnosis Date Noted  . Enterococcus UTI 10/19/2016  . Hyponatremia 10/19/2016  . Vitamin D deficiency 10/19/2016  . Elevated bilirubin   . Urinary tract infection with hematuria   . Fall   . Hepatic encephalopathy (HCC)   . Metabolic encephalopathy 10/07/2016  . UTI (urinary tract infection) 10/07/2016  . Cirrhosis of liver with ascites (HCC) 09/20/2016  . Ascites 03/27/2016  . 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 Shortness of breath 02/10/2015  . 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 Chronic renal insufficiency, stage III (moderate) 01/28/2015  . Type 2 diabetes with complication (HCC) 01/28/2015  . Acute on chronic left systolic heart failure (HCC) 12/26/2014  . Rash and nonspecific skin eruption 06/20/2014  . Skin lesion of back 06/20/2014  . Preventative health care 06/20/2014  . Right medial knee pain 05/24/2014  . Right ankle sprain 05/24/2014  . Atrial fibrillation (HCC) 05/24/2014  . Hyperlipidemia 05/24/2014  . CHF (congestive heart failure) (HCC) 10/31/2011    Class: Chronic  . Other ascites 10/31/2011    Class: Acute  . DM II (diabetes mellitus, type II), controlled (HCC) 10/31/2011    Class: Chronic  . 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    Class: Chronic    CMP     Component Value Date/Time   NA 133 (A) 10/25/2016   K 4.7 10/25/2016   CL 103 10/17/2016 0409   CO2 19 (L) 10/17/2016 0409   GLUCOSE 79 10/17/2016 0409   BUN 57 (A) 10/25/2016   CREATININE 1.3 10/25/2016   CREATININE 1.50 (H) 10/17/2016 0409   CALCIUM 8.4 (L) 10/17/2016 0409   PROT 7.6 10/12/2016 0454   ALBUMIN 3.0 (L) 10/12/2016 0454   AST 32 10/12/2016 0454   ALT 18 10/12/2016 0454   ALKPHOS 126 10/12/2016 0454   BILITOT 3.3 (H) 10/12/2016 0454   GFRNONAA 43 (L) 10/17/2016 0409   GFRAA 50 (L) 10/17/2016 0409    Recent Labs  02/14/16 0431  10/10/16 0411   10/16/16 0423 10/16/16 1506  10/17/16 0409 10/18/16 10/22/16 10/25/16  NA 133*  < > 126*  < > 125*  125* 133*  133*  --  131*  131*  126* 132* 133*  K 3.4*  < > 3.5  < > 4.1  4.1 4.1  4.1  < > 4.2  4.2 5.2 5.6* 4.7  CL 98*  < > 92*  < > 97* 104  --  103  --   --   --   CO2 25  < > 20*  < > 18* 18*  --  19*  --   --   --   GLUCOSE 149*  < > 123*  < > 120* 200*  --  79  --   --   --   BUN 62*  < > 90*  < > 72*  72* 71*  71*  --  70*  70* 68* 61* 57*  CREATININE 1.44*  < > 1.84*  < > 1.58*  1.6* 1.55*  1.6*  --  1.50*  1.5* 1.4* 1.3 1.3  CALCIUM 9.1  < > 9.4  < > 8.4* 8.5*  --  8.4*  --   --   --   MG 2.3  --  2.1  --   --   --   --   --   --   --   --   < > = values in this interval not displayed.  Recent Labs  10/08/16 0341 10/11/16 10/11/16 0414 10/12/16 0454  AST 34 32 32  32 32  ALT 22 18 20  20 18   ALKPHOS 144* 126* 138*  138* 126  BILITOT 4.9*  --  3.5* 3.3*  PROT 8.2*  --  8.0 7.6  ALBUMIN 3.3*  --  3.0* 3.0*    Recent Labs  03/27/16 1900  07/11/16 1130  10/11/16 0838 10/12/16 0454 10/16/16 0423 10/18/16 10/22/16  WBC 6.0  < > 6.2  < > 7.2  7.2 7.0  7.0 9.2  9.2 7.7 5.7  NEUTROABS 4.2  --  4.5  --  6.0  --   --   --   --   HGB 12.8*  < > 13.4  < > 13.0  13.0* 12.1*  12.1* 12.8* 13.0* 13.5  HCT 38.6*  < > 39.1  < > 36.3*  36* 34.8*  35* 36.6* 40* 42  MCV 87.7  < > 89.4  < > 85.8 87.2 88.8  --   --   PLT 134*  < > 116.0*  < > 138*  138* 132*  132* 180 190 174  < > = values in this interval not displayed. No results for input(s): CHOL, LDLCALC, TRIG in the last 8760 hours.  Invalid input(s): HCL Lab Results  Component Value Date   MICROALBUR <0.7 07/11/2016   Lab Results  Component Value Date   TSH 10.172 (H) 10/11/2016   Lab Results  Component Value Date   HGBA1C 7.2 (H) 07/11/2016   Lab Results  Component Value Date   CHOL 198 07/04/2014   HDL 40.40 07/04/2014   LDLCALC 139 (H) 07/04/2014   TRIG 95.0 07/04/2014   CHOLHDL 5  07/04/2014    Significant Diagnostic Results in last 30 days:  Dg Chest 2 View  Result Date: 10/07/2016 CLINICAL DATA:  Weakness, recent falls EXAM: CHEST  2 VIEW COMPARISON:  03/27/2016 FINDINGS: Cardiac shadow is enlarged. Postsurgical changes are again seen. The lungs are well aerated bilaterally with mild central vascular congestion. No focal infiltrate or sizable effusion is seen. No bony abnormality is noted. IMPRESSION: Mild vascular congestion. Electronically Signed   By: Eulah Pont.D.  On: 10/07/2016 15:53   Ct Head Wo Contrast  Result Date: 10/09/2016 CLINICAL DATA:  Recent fall.  Unsteady gait. EXAM: CT HEAD WITHOUT CONTRAST TECHNIQUE: Contiguous axial images were obtained from the base of the skull through the vertex without intravenous contrast. COMPARISON:  None. FINDINGS: Brain: Cortical atrophy and chronic microvascular ischemic change are identified. No acute intracranial abnormality including hemorrhage, infarct, mass lesion, mass effect, midline shift or abnormal extra-axial fluid collection. No hydrocephalus or pneumocephalus. Vascular: Extensive atherosclerotic vascular disease is present. Skull: Intact.  No focal lesion. Sinuses/Orbits: Negative. Other: Scalp calcifications on the left are incidentally noted and likely due to prior infectious or inflammatory process. The patient also has a cystic lesion in the scalp over the left side of the cranium near the vertex most consistent with a sebaceous cyst. IMPRESSION: No acute abnormality. Atrophy and chronic microvascular ischemic change. Atherosclerosis. Electronically Signed   By: Drusilla Kanner M.D.   On: 10/09/2016 15:37   Ct Abdomen Pelvis W Contrast  Result Date: 10/07/2016 CLINICAL DATA:  Status post fall 5 days ago. Increasing weakness over the past 2 weeks. EXAM: CT ABDOMEN AND PELVIS WITH CONTRAST TECHNIQUE: Multidetector CT imaging of the abdomen and pelvis was performed using the standard protocol following  bolus administration of intravenous contrast. CONTRAST:  75 ml ISOVUE-300 IOPAMIDOL (ISOVUE-300) INJECTION 61% COMPARISON:  None. FINDINGS: Lower chest: Trace right pleural effusion is noted. No left pleural effusion or pericardial effusion. Calcific coronary artery disease is seen. Gynecomastia is identified. Mild dependent atelectasis is seen in the right base. Hepatobiliary: The liver is shrunken with a nodular border consistent with cirrhosis. The portal vein is patent with laminar flow on early images seen in the SMV and portal veins. No focal liver lesion. Several small gallstones are identified. Pancreas: Unremarkable. No pancreatic ductal dilatation or surrounding inflammatory changes. Spleen: Normal in size without focal abnormality. Adrenals/Urinary Tract: Small low attenuating lesions in the right kidney cannot be definitively characterized but are likely cysts. Scarring is noted in the upper pole of the right kidney. The adrenal glands appear normal. Urinary bladder is distended but otherwise unremarkable. Stomach/Bowel: Colonic diverticula without diverticulitis noted the stomach and small bowel appear normal. Vascular/Lymphatic: Aortoiliac atherosclerosis without aneurysm is identified. No lymphadenopathy. Reproductive: Negative.  Penile prosthesis noted. Other: Small volume of abdominal ascites is seen.  No hernia. Musculoskeletal: Scoliosis and multilevel spondylosis are identified. Multiple Schmorl's nodes are seen. No worrisome lesion is identified. IMPRESSION: Cirrhotic liver with associated small volume of abdominal ascites. Trace right pleural effusion. Cardiomegaly. Calcific aortic and coronary atherosclerosis. Gallstones without cholecystitis. Diverticulosis without diverticulitis. Gynecomastia. Electronically Signed   By: Drusilla Kanner M.D.   On: 10/07/2016 19:00    Assessment and Plan  Urticarial rash - based on how it looks and acts;wife does not desire anything that makes pt  sleepy, he sleeps too much ; I have written for pepcid 20 mg daily  HEPATIC ENCEPHALOPATHY - PT IS ON LACTULOSE 30 GM tid; AMMONIA LEVEL HAS BEEN ORDERED   Late entry - ammonia level 185, lactulose has been inc to 45 gm BID and 30 gm qevening   Time spent > 35 min;> 50% of time with patient was spent reviewing records, labs, tests and studies, counseling and developing plan of care  Thurston Hole D. Lyn Hollingshead, MD

## 2016-11-03 ENCOUNTER — Encounter: Payer: Self-pay | Admitting: Internal Medicine

## 2016-11-05 DIAGNOSIS — L89510 Pressure ulcer of right ankle, unstageable: Secondary | ICD-10-CM | POA: Diagnosis not present

## 2016-11-05 DIAGNOSIS — L89892 Pressure ulcer of other site, stage 2: Secondary | ICD-10-CM | POA: Diagnosis not present

## 2016-11-05 DIAGNOSIS — I83028 Varicose veins of left lower extremity with ulcer other part of lower leg: Secondary | ICD-10-CM | POA: Diagnosis not present

## 2016-11-12 DIAGNOSIS — L89510 Pressure ulcer of right ankle, unstageable: Secondary | ICD-10-CM | POA: Diagnosis not present

## 2016-11-12 DIAGNOSIS — L89892 Pressure ulcer of other site, stage 2: Secondary | ICD-10-CM | POA: Diagnosis not present

## 2016-11-12 DIAGNOSIS — I83028 Varicose veins of left lower extremity with ulcer other part of lower leg: Secondary | ICD-10-CM | POA: Diagnosis not present

## 2016-11-13 ENCOUNTER — Other Ambulatory Visit: Payer: Self-pay | Admitting: *Deleted

## 2016-11-13 NOTE — Patient Outreach (Signed)
[facility]) Care Management  11/13/2016  [name] February 13, 1941 [id number]   Met with patient in his room. Patient states he is going home with wife by the weekend.  RNCM reviewed [facility] care management program.  Patient did not want to sign up, but did accept a brochure and RNCM contact.  Call to patient wife, [name] [phone] to review [facility] care management and reviewbrochure left in room.  No answer and unable to leave a message.   Will attempt to follow up later  [place]. [name], RN, BSN, [place] [phone]) Business Cell  [phone]) Toll Free Office

## 2016-11-18 ENCOUNTER — Encounter: Payer: Self-pay | Admitting: Internal Medicine

## 2016-11-18 ENCOUNTER — Non-Acute Institutional Stay (SKILLED_NURSING_FACILITY): Payer: PPO | Admitting: Internal Medicine

## 2016-11-18 ENCOUNTER — Other Ambulatory Visit: Payer: Self-pay | Admitting: *Deleted

## 2016-11-18 DIAGNOSIS — G9341 Metabolic encephalopathy: Secondary | ICD-10-CM | POA: Diagnosis not present

## 2016-11-18 DIAGNOSIS — I5082 Biventricular heart failure: Secondary | ICD-10-CM

## 2016-11-18 DIAGNOSIS — K7290 Hepatic failure, unspecified without coma: Secondary | ICD-10-CM | POA: Diagnosis not present

## 2016-11-18 DIAGNOSIS — E871 Hypo-osmolality and hyponatremia: Secondary | ICD-10-CM

## 2016-11-18 DIAGNOSIS — N183 Chronic kidney disease, stage 3 unspecified: Secondary | ICD-10-CM

## 2016-11-18 DIAGNOSIS — K7682 Hepatic encephalopathy: Secondary | ICD-10-CM

## 2016-11-18 DIAGNOSIS — I480 Paroxysmal atrial fibrillation: Secondary | ICD-10-CM

## 2016-11-18 DIAGNOSIS — Z794 Long term (current) use of insulin: Secondary | ICD-10-CM

## 2016-11-18 DIAGNOSIS — E1122 Type 2 diabetes mellitus with diabetic chronic kidney disease: Secondary | ICD-10-CM

## 2016-11-18 DIAGNOSIS — K7460 Unspecified cirrhosis of liver: Secondary | ICD-10-CM | POA: Diagnosis not present

## 2016-11-18 DIAGNOSIS — R188 Other ascites: Secondary | ICD-10-CM

## 2016-11-18 DIAGNOSIS — E559 Vitamin D deficiency, unspecified: Secondary | ICD-10-CM | POA: Diagnosis not present

## 2016-11-18 NOTE — Progress Notes (Signed)
[facility] Room Number: 108P Place of Service:  SNF (31)  Randon Goldsmithnne D. Lyn HollingsheadAlexander, MD   PCP: Jeoffrey MassedMCGOWEN,PHILIP H, MD Patient Care Team: Jeoffrey MassedPhilip H McGowen, MD as PCP - General (Family Medicine) Ranee Gosselinonald Gioffre, MD as Consulting Physician (Orthopedic Surgery) Sallye Lathristopher Groat, MD as Consulting Physician (Ophthalmology) Manus RuddMatthew Tsuei, MD as Consulting Physician (General Surgery) Orpah CobbAjay [name], MD as Consulting Physician (Cardiology)  Extended Emergency Contact Information Primary Emergency Contact: Robinette,Nancy Address: [address]          [place] [id number] [place] Mobile Phone: [phone] Relation: Spouse Secondary Emergency Contact: Salome ArntJohnson,David Randy  [place] Mobile Phone: [phone] Relation: Son  Allergies  Allergen Reactions  . Adhesive [Tape] Other (See Comments)    Reaction:  Pulls pts skin off   . Sulfa Antibiotics Rash    Chief Complaint  Patient presents with  . Discharge Note    Discharged from SNF    HPI:  76 y.o. 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 fib not on anticoagulation due to prior history of GI bleed and nonadherence to medications,, CK D stage III, coronary artery disease, hypertension, type 2 diabetes mellitus, umbilical hernia and prior history of PE was brought to the ED by his wife with 5 days of confusion. Patient had an unwitnessed fall at home 5 days back following which he started having increased confusion. [facility] for  acute metabolic encephalopathy secondary to UTI and hepatic encephalopathy with ammonia level of 175. He was started on rifaximin and lactulose and his ammonia level decreased (at 36 night before discharge). He was found to have an UTI with Enterococcus and was treated with  ceftriaxone which was transitioned to Amoxicillin. He completely his treatment course on day of discharge. His sodium level fluctuated during hospitalization as did his CBG's. He insulin regimen was adjusted and his blood sugar levels remained stable.His sodium level increased to 131 on day of discharge. Pt is admitted to SNF for OT/PTand is now ready to be d/c to home.    Past Medical History:  Diagnosis Date  . Atrial fibrillation (HCC)    ?GI bleed in past, plus pt tends to "self adjust/manage" his meds w/out regard for MD's guidance, so he has NOT been on anticoagulant for this.  . CHF (congestive heart failure) (HCC)   . Chronic bronchitis (HCC)    "used to get it often; haven't had it for awhile now" (04/12/2013)  . Chronic renal insufficiency, stage III (moderate)    GFR @ 50  . Chronic systolic heart failure (HCC)   . 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  Hospitalized with hepatic encephalopathy 09/2016.  Marland Kitchen. Coronary artery disease   . Gout   . Heart murmur   . High cholesterol   . Hypertension   . Pneumonia   . Positive TB test    "but I don't have it"  . Postpolio syndrome 1951   "affected the right side of my body; been weak on that side ever since" (04/12/2013)  . 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 Dione BoozeGroat)  . Pulmonary embolism (HCC) 1980's or 1990's  . Severe mitral regurgitation 2013  . Severe tricuspid regurgitation 2013  .  Subclinical hypothyroidism   . Type II diabetes mellitus (HCC)   . Umbilical hernia 02/2015   Symptomatic--Dr. Corliss Skains to repair if cleared by cardiologist    Past Surgical History:  Procedure Laterality Date  . APPENDECTOMY  ~ 1962  . CARDIAC CATHETERIZATION     "I've had a few since 1989" (04/12/2013)  . CARDIAC CATHETERIZATION  12/2012   Severe native CAD, patent bypass grafts x 3, EF 25-30%--med mgmt  .  CATARACT EXTRACTION W/ INTRAOCULAR LENS  IMPLANT, BILATERAL Bilateral 2005  . CERVICAL DISCECTOMY  1984   C6-7  . CORONARY ARTERY BYPASS GRAFT  01/1988; 05/1999   CABG X 3; CABG X 2  . EXPLORATORY LAPAROTOMY  ~ 1962   "took out my appendix while there; bowel was inflammed" (04/12/2013)  . HYDROCELE EXCISION / REPAIR     "couple times" (04/12/2013)  . LEFT AND RIGHT HEART CATHETERIZATION WITH CORONARY/GRAFT ANGIOGRAM  01/13/2013   Procedure: LEFT AND RIGHT HEART CATHETERIZATION WITH Isabel Caprice;  Surgeon: [name], MD;  Location: [facility] CATH LAB;  Service: Cardiovascular;;  . PARACENTESIS  2013-2014   Multiple times, usually pulling off 6-7 liters of fluid  . RETINAL LASER PROCEDURE Bilateral    "more than once on the left" (04/12/2013)  . TRANSTHORACIC ECHOCARDIOGRAM  2013;12/2014;01/2016   LV dilation, EF 30-35%, diffuse hypokinesis, grade I diast dysfxn, severe mitral and tricuspid regurg, LAE and RAE--biventricular dysfunction moderate/severe. 01/2016 dilated LV with akinesis/hypokinesis diffusely, severe RV dysfxn, severe mitral regurg, severe PA HTN     reports that he quit smoking about 43 years ago. His smoking use included Cigarettes. He has a 5.00 pack-year smoking history. He quit smokeless tobacco use about 47 years ago. His smokeless tobacco use included Chew. He reports that he does not drink alcohol or use drugs. Social History   Social History  . Marital status: Married    Spouse name: N/A  . Number of children: 3  . Years of education: N/A   Occupational History  . former Music therapist    Social History Main Topics  . Smoking status: Former Smoker    Packs/day: 0.25    Years: 20.00    Types: Cigarettes    Quit date: 10/30/1973  . Smokeless tobacco: Former Neurosurgeon    Types: Chew    Quit date: 10/21/1969  . Alcohol use No     Comment: 04/12/2013 "have ~ 2 drinks/yr; a drink for me is ~ 0.5oz"  . Drug use: No  . Sexual activity: Not Currently   Other Topics  Concern  . Not on file   Social History Narrative   Married (2nd marriage).  Has 3 living children, one child deceased.   Occupation: former Music therapist.  Disabled due to cardiovascular problems.   10-15 pack-yr hx tobacco, quit approx 1970s.     Some distant hx of alcohol abuse/binging, but no alcohol since 2013.   NO drug use.          Pertinent  Health Maintenance Due  Topic Date Due  . INFLUENZA VACCINE  07/11/2017 (Originally 05/21/2016)  . PNA vac Low Risk Adult (1 of 2 - PCV13) 02/20/2049 (Originally 04/04/2005)  . HEMOGLOBIN A1C  01/08/2017  . FOOT EXAM  07/11/2017  . URINE MICROALBUMIN  07/11/2017  . OPHTHALMOLOGY EXAM  07/29/2017    Medications: Allergies as of 11/18/2016      Reactions   Adhesive [tape] Other (See Comments)   Reaction:  Pulls pts skin off    Sulfa Antibiotics Rash  Medication List       Accurate as of 11/18/16  1:58 PM. Always use your most recent med list.          calcium carbonate 1500 (600 Ca) MG Tabs tablet Commonly known as:  OSCAL Take 600 mg of elemental calcium by mouth daily.   carvedilol 3.125 MG tablet Commonly known as:  COREG Take 1 tablet (3.125 mg total) by mouth 2 (two) times daily with a meal.   cholecalciferol 1000 units tablet Commonly known as:  VITAMIN D Take 1,000 Units by mouth daily.   feeding supplement (PRO-STAT SUGAR FREE 64) Liqd Take 30 mLs by mouth 2 (two) times daily. To support wound healing   furosemide 20 MG tablet Commonly known as:  LASIX Take 1 tablet (20 mg total) by mouth 2 (two) times daily.   insulin NPH-regular Human (70-30) 100 UNIT/ML injection Commonly known as:  NOVOLIN 70/30 Inject 6 Units into the skin 2 (two) times daily with a meal.   LACTULOSE PO Take 45 mLs by mouth 2 (two) times daily. Then give 30 ml at bedtime.   magnesium oxide 400 (241.3 Mg) MG tablet Commonly known as:  MAG-OX Take 400 mg by mouth daily.   metolazone 5 MG tablet Commonly known as:  ZAROXOLYN Take  5 mg by mouth daily.   ondansetron 4 MG tablet Commonly known as:  ZOFRAN Take 4 mg by mouth every 8 (eight) hours as needed for nausea.   potassium chloride SA 20 MEQ tablet Commonly known as:  K-DUR,KLOR-CON Take 20 mEq by mouth 3 (three) times daily.   rifaximin 550 MG Tabs tablet Commonly known as:  XIFAXAN Take 1 tablet (550 mg total) by mouth 2 (two) times daily.   spironolactone 25 MG tablet Commonly known as:  ALDACTONE Take 25 mg by mouth daily.   vitamin B-12 1000 MCG tablet Commonly known as:  CYANOCOBALAMIN Take 1,000 mcg by mouth daily.   vitamin C 500 MG tablet Commonly known as:  ASCORBIC ACID Take 500 mg by mouth daily.        Vitals:   11/18/16 1002  BP: 120/72  Pulse: 80  Resp: 18  Temp: 98.7 F (37.1 C)  Weight: 159 lb (72.1 kg)  Height: 5\' 5"  (1.651 m)   Body mass index is 26.46 kg/m.  Physical Exam  GENERAL APPEARANCE: Alert, conversant. No acute distress.  HEENT: Unremarkable. RESPIRATORY: Breathing is even, unlabored. Lung sounds are clear   CARDIOVASCULAR: Heart RRR  High pitched systolic  murmur, no rubs or gallops. No peripheral edema.  GASTROINTESTINAL: Abdomen is soft, non-tender, not distended w/ normal bowel sounds.  NEUROLOGIC: Cranial nerves 2-12 grossly intact. Moves all extremities   Labs reviewed: Basic Metabolic Panel:  Recent Labs  16/10/96 0431  10/10/16 0411  10/16/16 0423 10/16/16 1506  10/17/16 0409 10/18/16 10/22/16 10/25/16  NA 133*  < > 126*  < > 125*  125* 133*  133*  --  131*  131* 126* 132* 133*  K 3.4*  < > 3.5  < > 4.1  4.1 4.1  4.1  < > 4.2  4.2 5.2 5.6* 4.7  CL 98*  < > 92*  < > 97* 104  --  103  --   --   --   CO2 25  < > 20*  < > 18* 18*  --  19*  --   --   --   GLUCOSE 149*  < > 123*  < > 120* 200*  --  79  --   --   --   BUN 62*  < > 90*  < > 72*  72* 71*  71*  --  70*  70* 68* 61* 57*  CREATININE 1.44*  < > 1.84*  < > 1.58*  1.6* 1.55*  1.6*  --  1.50*  1.5* 1.4* 1.3 1.3  CALCIUM  9.1  < > 9.4  < > 8.4* 8.5*  --  8.4*  --   --   --   MG 2.3  --  2.1  --   --   --   --   --   --   --   --   < > = values in this interval not displayed. Lab Results  Component Value Date   MICROALBUR <0.7 07/11/2016   Liver Function Tests:  Recent Labs  10/08/16 0341 10/11/16 10/11/16 0414 10/12/16 0454  AST 34 32 32  32 32  ALT 22 18 20  20 18   ALKPHOS 144* 126* 138*  138* 126  BILITOT 4.9*  --  3.5* 3.3*  PROT 8.2*  --  8.0 7.6  ALBUMIN 3.3*  --  3.0* 3.0*    Recent Labs  10/07/16 1429  LIPASE 47    Recent Labs  10/11/16 0414 10/12/16 0454 10/16/16 1800  AMMONIA 97* 80* 36*   CBC:  Recent Labs  03/27/16 1900  07/11/16 1130  10/11/16 0838 10/12/16 0454 10/16/16 0423 10/18/16 10/22/16  WBC 6.0  < > 6.2  < > 7.2  7.2 7.0  7.0 9.2  9.2 7.7 5.7  NEUTROABS 4.2  --  4.5  --  6.0  --   --   --   --   HGB 12.8*  < > 13.4  < > 13.0  13.0* 12.1*  12.1* 12.8* 13.0* 13.5  HCT 38.6*  < > 39.1  < > 36.3*  36* 34.8*  35* 36.6* 40* 42  MCV 87.7  < > 89.4  < > 85.8 87.2 88.8  --   --   PLT 134*  < > 116.0*  < > 138*  138* 132*  132* 180 190 174  < > = values in this interval not displayed. Lipid No results for input(s): CHOL, HDL, LDLCALC, TRIG in the last 8760 hours. Cardiac Enzymes:  Recent Labs  02/13/16 2317 02/14/16 0431 02/14/16 1030  TROPONINI 0.07* 0.09* 0.08*   BNP:  Recent Labs  02/13/16 2317 03/27/16 1900  BNP 866.6* 921.0*   CBG:  Recent Labs  10/17/16 0804 10/17/16 0901 10/17/16 1213  GLUCAP 70 88 114*    Procedures and Imaging Studies During Stay: No results found.  Assessment/Plan:   Metabolic encephalopathy  Hepatic encephalopathy (HCC)  Hyponatremia  Controlled type 2 diabetes mellitus with stage 3 chronic kidney disease, with long-term current use of insulin (HCC)  Cirrhosis of liver with ascites, unspecified hepatic cirrhosis type (HCC)  Biventricular congestive heart failure  Paroxysmal atrial  fibrillation (HCC)  Vitamin D deficiency   Patient is being discharged with the following home health services:  Ot/PT/Nursing  Patient is being discharged with the following durable medical equipment:    Patient has been advised to f/u with their PCP in 1-2 weeks to bring them up to date on their rehab stay.  Social services at facility was responsible for arranging this appointment.  Pt was provided with a 30 day supply of prescriptions for medications and refills must be obtained from their PCP.  For controlled substances,  a more limited supply may be provided adequate until PCP appointment only.    Time spent > 30 min'> 50% of time with patient was spent reviewing records, labs, tests and studies, counseling and developing plan of care  Randon Goldsmith. Lyn Hollingshead, MD

## 2016-11-18 NOTE — Patient Outreach (Signed)
Outreach call attempted to patient wife, [name]. No answer, unable to leave a message.  Will attempt again later if unable to reach will sign off.  Alben SpittleMary E. Albertha GheeNiemczura, RN, BSN, CCM  Post Acute Chartered loss adjusterCare Coordinator [facility] [phone]) Business Cell  [phone] Toll Free Office

## 2016-11-20 DIAGNOSIS — M6281 Muscle weakness (generalized): Secondary | ICD-10-CM | POA: Diagnosis not present

## 2016-11-20 DIAGNOSIS — G9341 Metabolic encephalopathy: Secondary | ICD-10-CM | POA: Diagnosis not present

## 2016-11-21 ENCOUNTER — Telehealth: Payer: Self-pay | Admitting: Family Medicine

## 2016-11-21 NOTE — Telephone Encounter (Signed)
[name] report [facility] has been discharge from skilled nursing facility with orders for skilled nursing, PT & OT.  Please return call with verbal orders for nursing:  Week 1: 1 visit Weeks 2 thru 5: 2 times a week   Please return call to [name] at [phone] with verbal orders.

## 2016-11-22 ENCOUNTER — Other Ambulatory Visit: Payer: Self-pay

## 2016-11-22 NOTE — Patient Outreach (Signed)
[facility]N) Care Management  11/22/2016  [name] 1941-10-09 [id number]   Telephonic Screening, Transition of Care and Initial Assessment   Referral:  11/21/16  Source:  [facility] Utilization management Department Issue:  SNF discharge 11/19/16.  H/o Verdie DrownMary Niemczura, [facility]  unable to reach wife and left a brochure with patient in the facility.    H/o Patient with wounds; 2 pressure and 1 venous. Concerns regarding support from wife for patient and their needs.   H/o patient [facility] 11/18/16  Subjective: Patient and wife reached.  Call completed with wife.  [address]  [place] [id number] [phone] Judie Petit(M)  Providers: Primary MD:  Dr. Nicoletta BaPhilip McGowen  -  last appt: 07/11/16     next appt: 11/25/16 Cardiologist: Dr. Orpah CobbAjay [name] Orthopedics: Dr. Ranee Gosselinonald Gioffre  Ophthalmology:  Dr. Sallye Lathristopher Groat General Surgery:  Dr. Manus RuddMatthew Tsuei HH:  [facility] (11/21/2016 - )  RN, PT Insurance:  HTA and Medicaid  Psycho/Social: Patient lives in his home with wife/caregiver.  Wife works 3 days a week.  Mobility: ambulates with walker in the house but difficulty getting from house to car for appointments.  No W/C in the home.  Wife states she thinks Cardiologist is sending an order to [facility] for W/C.  Falls: 1 - last fall date 09/2016 Pain: none Depression: none Safety: none Transportation: wife  Caregiver: wife.  Wife works 3 days a week.  Emergency Contact: wife Wife:  Fontaine Noancy Trulson, [phone] cell Son:  Sula SodaDavid Randy Agro  [phone] cell Advance Directive: none  Consent:  Patient provides verbal consent to complete calls and discuss patients PHI with wife, [name].  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  A-Fib (not on  anticoagulation due to prior history of GI Bleed and non-adherence to medications.  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  Discharged to SNF for OT/PT.  -10/17/16 - 11/18/16  [facility]   Diabetes Wife checks patient's BS twice daily - running 100 - 145.  BP 120/72 11/18/2016 Weight 159 lb (72 kg) 11/18/2016 Height 65 in (165 cm) 11/18/2016 BMI 26.50 (Overweight, Pre-obe 11/18/2016  Lipid Panel completed 07/04/2014 HDL 40.400 07/04/2014 LDL 139.000 07/04/2014 Cholesterol, total 198.000 07/04/2014 Triglycerides 95.000 07/04/2014 A1C 7.200 07/11/2016 Glucose Random 154.000 10/25/2016  Medications:  Patient taking less than 15 medications. Co-pay cost issues: none but having issue getting prior prior authorization  authorization for Dionisio PaschalXafaxan- states has not had any of this medication in 2 days.  Prevnar (PCV13) N/D Pneumovax (PPS N/D Flu Vaccine N/D tDAP Vaccine 06/20/2014 [name]  Medication List  Per discharge order            Accurate as of 11/18/16  1:58 PM.            calcium carbonate 1500 (600 Ca) MG Tabs tablet Commonly known as:  OSCAL Take 600 mg of elemental calcium by mouth daily.   carvedilol 3.125 MG tablet Commonly known as:  COREG Take 1 tablet (3.125 mg total) by mouth 2 (two) times daily with a meal.   cholecalciferol 1000 units tablet Commonly known as:  VITAMIN D Take 1,000  Units by mouth daily.   feeding supplement (PRO-STAT SUGAR FREE 64) Liqd Take 30 mLs by mouth 2 (two) times daily. To support wound healing   furosemide 20 MG tablet Commonly known as:  LASIX Take 1 tablet (20 mg total) by mouth 2 (two) times daily.   insulin NPH-regular Human (70-30) 100 UNIT/ML injection Commonly known as:  NOVOLIN 70/30 Inject 6 Units into the  skin 2 (two) times daily with a meal.   LACTULOSE PO Take 45 mLs by mouth 2 (two) times daily. Then give 30 ml at bedtime.   magnesium oxide 400 (241.3 Mg) MG tablet Commonly known as:  MAG-OX Take 400 mg by mouth daily.   metolazone 5 MG tablet Commonly known as:  ZAROXOLYN Take 5 mg by mouth daily.   ondansetron 4 MG tablet Commonly known as:  ZOFRAN Take 4 mg by mouth every 8 (eight) hours as needed for nausea.   potassium chloride SA 20 MEQ tablet Commonly known as:  K-DUR,KLOR-CON Take 20 mEq by mouth 3 (three) times daily.   rifaximin 550 MG Tabs tablet Commonly known as:  XIFAXAN Take 1 tablet (550 mg total) by mouth 2 (two) times daily.   spironolactone 25 MG tablet Commonly known as:  ALDACTONE Take 25 mg by mouth daily.   vitamin B-12 1000 MCG tablet Commonly known as:  CYANOCOBALAMIN Take 1,000 mcg by mouth daily.   vitamin C 500 MG tablet Commonly known as:  ASCORBIC ACID Take 500 mg by mouth daily.     Encounter Medications:  Outpatient Encounter Prescriptions as of 11/22/2016  Medication Sig  . Amino Acids-Protein Hydrolys (FEEDING SUPPLEMENT, PRO-STAT SUGAR FREE 64,) LIQD Take 30 mLs by mouth 2 (two) times daily. To support wound healing  . calcium carbonate (OSCAL) 1500 (600 Ca) MG TABS tablet Take 600 mg of elemental calcium by mouth daily.   . carvedilol (COREG) 3.125 MG tablet Take 1 tablet (3.125 mg total) by mouth 2 (two) times daily with a meal.  . cholecalciferol (VITAMIN D) 1000 units tablet Take 1,000 Units by mouth daily.  . furosemide (LASIX) 20 MG tablet Take 1 tablet (20 mg total) by mouth 2 (two) times daily.  . insulin NPH-regular Human (NOVOLIN 70/30) (70-30) 100 UNIT/ML injection Inject 6 Units into the skin 2 (two) times daily with a meal.  . LACTULOSE PO Take 45 mLs by mouth 2 (two) times daily. Then give 30 ml at bedtime.  . magnesium oxide (MAG-OX) 400 (241.3 Mg) MG tablet Take 400 mg by mouth daily.   . metolazone  (ZAROXOLYN) 5 MG tablet Take 5 mg by mouth daily.   . ondansetron (ZOFRAN) 4 MG tablet Take 4 mg by mouth every 8 (eight) hours as needed for nausea.  . potassium chloride SA (K-DUR,KLOR-CON) 20 MEQ tablet Take 20 mEq by mouth 3 (three) times daily.   . rifaximin (XIFAXAN) 550 MG TABS tablet Take 1 tablet (550 mg total) by mouth 2 (two) times daily.  Marland Kitchen spironolactone (ALDACTONE) 25 MG tablet Take 25 mg by mouth daily.   . vitamin B-12 (CYANOCOBALAMIN) 1000 MCG tablet Take 1,000 mcg by mouth daily.  . vitamin C (ASCORBIC ACID) 500 MG tablet Take 500 mg by mouth daily.   No facility-administered encounter medications on file as of 11/22/2016.     Functional Status:  In your present state of health, do you have any difficulty performing the following activities: 11/22/2016 10/07/2016  Hearing? N N  Vision? N N  Difficulty concentrating or making  decisions? N N  Walking or climbing stairs? Y Y  Dressing or bathing? Y Y  Doing errands, shopping? [name]  Preparing Food and eating ? N -  Using the Toilet? N -  In the past six months, have you accidently leaked urine? N -  Do you have problems with loss of bowel control? N -  Managing your Medications? N -  Managing your Finances? N -  Housekeeping or managing your Housekeeping? N -  Some recent data might be hidden    Fall/Depression Screening: PHQ 2/9 Scores 11/22/2016 09/13/2016 09/06/2016 09/04/2016 07/11/2016 04/27/2015 03/28/2015  PHQ - 2 Score 0 0 0 0 0 0 0    Fall Risk  11/22/2016 09/13/2016 09/06/2016 09/04/2016 07/11/2016  Falls in the past year? Yes Yes Yes Yes No  Number falls in past yr: 1 1 1 1  -  Injury with Fall? Yes Yes Yes Yes -  Risk Factor Category  High Fall Risk - - - -  Risk for fall due to : History of fall(s);Impaired balance/gait;Impaired mobility - - - -  Follow up Falls evaluation completed;Education provided;Falls prevention discussed - - - -    Preventives: Hearing: no issues  Eyes:  DM Eye Exam: negative retin  07/29/2016  Dentist:  Not disclosed this call.  Podiatrist: DM Foot Exam 07/11/2016  : Colonoscopy N/D Former smoker:  Quit 47 years ago.     Plan:  Referral:  11/21/16  Source:  [facility] Utilization management Department Issue:  SNF discharge 11/19/16.  H/o [name], [facility]  unable to reach wife and left a brochure with patient in the facility.    H/o Patient with wounds; 2 pressure and 1 venous. Concerns regarding support from wife for patient and their needs.   H/o patient discharged from [facility] 11/18/16 Telephonic Screening, Transition of Care and Initial Assessment 11/22/16 Telephonic RN CM Services:  11/22/16 Program:  Other:  Infection Prevention.   [facility] Telephonic RN CM Referral  -Disease management and education -Fall prevention: (last fall date 09/2016).  Monitor HH services:  RN, PT -DME:  W/C needs pending.  PT visit scheduled for next week.  (may need to get to the car for MD appt's) -Skin/Wound care management/healing  -Advance Directives    [facility] Referral -Patient having issue getting prior prior authorization for Xafaxan- states has not had any of this medication in 2 days.  Patient discharged from [facility] 11/18/16.    RN CM advised in next [facility] scheduled contact call within one week for Transition of care services and  next 30 days for monthly assessment and / or  care coordination services as needed.  RN CM advised to please notify MD of any changes in condition prior to scheduled appt's.   RN CM provided contact name and # [phone] or main office # [phone] and 24-hour nurse line # [phone].  RN CM confirmed patient is aware of 911 services for urgent emergency needs.  RN CM sent successful outreach letter and  [facility] Introductory package. RN CM sent Physician Enrollment/Barriers Letter and Initial Assessment to Primary MD RN CM notified [facility] Care Management Assistant: agreed to services/case opened.  [facility] CM Care Plan Problem One    Flowsheet Row Most Recent Value  Care Plan Problem One  Patient high risk for admission following hospital and SNF discharge 11/18/16.  Role Documenting the Problem One  Care Management Telephonic Coordinator  Care Plan for Problem One  Active  [facility] Long Term Goal (  31-90 days)  Patient will have no new admissions over the next 31-90 days.   [facility] Long Term Goal Start Date  11/22/16  Interventions for Problem One Long Term Goal  RN CM will provide education on reporting condition changes over the next 31-90 days.   [facility] CM Short Term Goal #1 (0-30 days)  Patient will engage with [facility] services until LOC needs improve over the next 30 days.   [facility] CM Short Term Goal #1 Start Date  11/22/16  Interventions for Short Term Goal #1  RN CM will continue to monitor [facility] services and progress over the nexet 30 days.     [facility] CM Care Plan Problem Two   Flowsheet Row Most Recent Value  Care Plan Problem Two  Medication Adherence.   Role Documenting the Problem Two  Care Management Telephonic Coordinator  Care Plan for Problem Two  Active  [facility] CM Short Term Goal #1 (0-30 days)  Patient will take all medications as directed over the next 30 days.   [facility] CM Short Term Goal #1 Start Date  11/22/16  Interventions for Short Term Goal #2   RN CM will provide education and monitor for compliance over the next 30 days.     [facility] CM Care Plan Problem Three   Flowsheet Row Most Recent Value  Care Plan Problem Three  Advance Directives   Role Documenting the Problem Three  Care Management Telephonic Coordinator  Care Plan for Problem Three  Active  [facility] CM Short Term Goal #1 (0-30 days)  Patient will engage in Advance Directive education over the next 30 days.   [facility] CM Short Term Goal #1 Start Date  11/22/16  Interventions for Short Term Goal #1  RN CM will provide Advance Direction education over the next 30 days.        Simmie Davies, BSN, RN, CCM  [facility] [employer] [facility] Care Management  Coordinator [phone] Direct [phone] Cell [phone] Office [phone] Fax [email or web address]

## 2016-11-22 NOTE — Telephone Encounter (Signed)
OK to give verbal orders for all of this.-thx

## 2016-11-22 NOTE — Telephone Encounter (Signed)
Okey Regalarol with [facility] notified and given verbal order for visits per Dr. Milinda CaveMcGowen.

## 2016-11-25 ENCOUNTER — Ambulatory Visit (INDEPENDENT_AMBULATORY_CARE_PROVIDER_SITE_OTHER): Payer: PPO | Admitting: Family Medicine

## 2016-11-25 ENCOUNTER — Encounter: Payer: Self-pay | Admitting: Family Medicine

## 2016-11-25 VITALS — BP 109/72 | HR 69 | Temp 97.6°F | Resp 16

## 2016-11-25 DIAGNOSIS — M25551 Pain in right hip: Secondary | ICD-10-CM | POA: Diagnosis not present

## 2016-11-25 DIAGNOSIS — E038 Other specified hypothyroidism: Secondary | ICD-10-CM

## 2016-11-25 DIAGNOSIS — I482 Chronic atrial fibrillation, unspecified: Secondary | ICD-10-CM

## 2016-11-25 DIAGNOSIS — I5082 Biventricular heart failure: Secondary | ICD-10-CM | POA: Diagnosis not present

## 2016-11-25 DIAGNOSIS — K7290 Hepatic failure, unspecified without coma: Secondary | ICD-10-CM

## 2016-11-25 DIAGNOSIS — E039 Hypothyroidism, unspecified: Secondary | ICD-10-CM

## 2016-11-25 DIAGNOSIS — J209 Acute bronchitis, unspecified: Secondary | ICD-10-CM | POA: Diagnosis not present

## 2016-11-25 DIAGNOSIS — E118 Type 2 diabetes mellitus with unspecified complications: Secondary | ICD-10-CM

## 2016-11-25 DIAGNOSIS — K7210 Chronic hepatic failure without coma: Secondary | ICD-10-CM | POA: Diagnosis not present

## 2016-11-25 DIAGNOSIS — K7682 Hepatic encephalopathy: Secondary | ICD-10-CM

## 2016-11-25 LAB — COMPREHENSIVE METABOLIC PANEL
ALBUMIN: 3.3 g/dL — AB (ref 3.5–5.2)
ALK PHOS: 168 U/L — AB (ref 39–117)
ALT: 13 U/L (ref 0–53)
AST: 35 U/L (ref 0–37)
BUN: 50 mg/dL — ABNORMAL HIGH (ref 6–23)
CHLORIDE: 92 meq/L — AB (ref 96–112)
CO2: 27 mEq/L (ref 19–32)
Calcium: 9.3 mg/dL (ref 8.4–10.5)
Creatinine, Ser: 1.54 mg/dL — ABNORMAL HIGH (ref 0.40–1.50)
GFR: 46.83 mL/min — AB (ref >60.00)
Glucose, Bld: 161 mg/dL — ABNORMAL HIGH (ref 70–99)
POTASSIUM: 4 meq/L (ref 3.5–5.1)
Sodium: 128 mEq/L — ABNORMAL LOW (ref 135–145)
Total Bilirubin: 2 mg/dL — ABNORMAL HIGH (ref 0.2–1.2)
Total Protein: 8.2 g/dL (ref 6.0–8.3)

## 2016-11-25 LAB — CBC WITH DIFFERENTIAL/PLATELET
BASOS PCT: 0.9 % (ref 0.0–3.0)
Basophils Absolute: 0 10*3/uL (ref 0.0–0.1)
EOS PCT: 4.1 % (ref 0.0–5.0)
Eosinophils Absolute: 0.2 10*3/uL (ref 0.0–0.7)
HCT: 42.7 % (ref 39.0–52.0)
HEMOGLOBIN: 14.3 g/dL (ref 13.0–17.0)
LYMPHS ABS: 0.6 10*3/uL — AB (ref 0.7–4.0)
Lymphocytes Relative: 11.8 % — ABNORMAL LOW (ref 12.0–46.0)
MCHC: 33.5 g/dL (ref 30.0–36.0)
MCV: 92.7 fl (ref 78.0–100.0)
MONOS PCT: 12.4 % — AB (ref 3.0–12.0)
Monocytes Absolute: 0.6 10*3/uL (ref 0.1–1.0)
NEUTROS PCT: 70.8 % (ref 43.0–77.0)
Neutro Abs: 3.5 10*3/uL (ref 1.4–7.7)
Platelets: 119 10*3/uL — ABNORMAL LOW (ref 150.0–400.0)
RBC: 4.6 Mil/uL (ref 4.22–5.81)
RDW: 16.2 % — AB (ref 11.5–15.5)
WBC: 5 10*3/uL (ref 4.0–10.5)

## 2016-11-25 LAB — TSH: TSH: 14.29 u[IU]/mL — ABNORMAL HIGH (ref 0.35–4.50)

## 2016-11-25 LAB — AMMONIA: AMMONIA: 129 umol/L — AB (ref 11–35)

## 2016-11-25 LAB — HEMOGLOBIN A1C: HEMOGLOBIN A1C: 7.4 % — AB (ref 4.6–6.5)

## 2016-11-25 MED ORDER — DOXYCYCLINE HYCLATE 100 MG PO TABS: 100.0000 mg | ORAL_TABLET | Freq: Two times a day (BID) | ORAL | 0 refills | Status: DC

## 2016-11-25 MED ORDER — PREDNISONE 20 MG PO TABS
ORAL_TABLET | ORAL | 0 refills | Status: DC
Start: 2016-11-25 — End: 2016-12-06

## 2016-11-25 NOTE — Progress Notes (Signed)
OFFICE VISIT  11/25/2016   CC:  Chief Complaint  Patient presents with  . discharge from [facility]  . Fall    x Friday  . Hip Pain    right hip, not due to fall   HPI:    Patient is a 76 y.o. Caucasian male who presents accompanied by his wife for f/u chronic illness, was admitted to hospital 12/18-12/28, 2017 for chronic liver failure, hepatic encephalopathy, enterococcus UTI.  He was discharged from hospital to [facility] for rehab.  He was discharged from there to home 11/19/16.   Waiting on xifaxan to get approved, already taking lactulose.  Wife says his mental status comes clear and then gets muddy some but nothing near what he was acting like when admitted per her report.  Four days ago while at home alone he stood up from the bed and feet slipped and he fell onto floor.  Wife came home from grocery and found him lying next the bed.  He said nothing hurt at that time.   Has had quite a bit of R hip pain chronically, feels a bit aggravated by the recent fall but this is not entirely clear. On/off swelling in L arm, recently worse after the fall (he sustained a skin tear to L arm).  Between the patient and his wife, there was a fair amount of confusing history given today.   They do not weigh him at home.  He has had a cough for the last few days, some wheezing.  No fever or obvious SOB per wife.  Pt denies feeling SOB.  No feeling of excess fluid in abdomen or legs per pt report today.  No abd pain, no fever.   Past Medical History:  Diagnosis Date  . Atrial fibrillation (HCC)    ?GI bleed in past, plus pt tends to "self adjust/manage" his meds w/out regard for MD's guidance, so he has NOT been on anticoagulant for this.  . CHF (congestive heart failure) (HCC)   . Chronic bronchitis (HCC)    "used to get it often; haven't had it for awhile now" (04/12/2013)  . Chronic renal insufficiency, stage III (moderate)    GFR @ 50  . Chronic systolic heart failure (HCC)   . 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  Hospitalized with hepatic encephalopathy 09/2016.  Marland Kitchen Coronary artery disease   . Gout   . Heart murmur   . High cholesterol   . Hypertension   . Pneumonia   . Positive TB test    "but I don't have it"  . Postpolio syndrome 1951   "affected the right side of my body; been weak on that side ever since" (04/12/2013)  . 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 Dione Booze)  . Pulmonary embolism (HCC) 1980's or 1990's  . Severe mitral regurgitation 2013  . Severe tricuspid regurgitation 2013  . Subclinical hypothyroidism   . Type II diabetes mellitus (HCC)   . Umbilical hernia 02/2015   Symptomatic--Dr. Corliss Skains to repair if cleared by cardiologist    Past Surgical History:  Procedure Laterality Date  . APPENDECTOMY  ~ 1962  . CARDIAC CATHETERIZATION     "I've had a few since 1989" (04/12/2013)  . CARDIAC CATHETERIZATION  12/2012   Severe native CAD, patent bypass grafts x 3, EF 25-30%--med mgmt  . CATARACT EXTRACTION W/ INTRAOCULAR LENS  IMPLANT, BILATERAL Bilateral 2005  . CERVICAL DISCECTOMY  1984   C6-7  . CORONARY ARTERY BYPASS GRAFT  01/1988; 05/1999   CABG X 3; CABG X 2  . EXPLORATORY LAPAROTOMY  ~ 1962   "took out my appendix while there; bowel was inflammed" (04/12/2013)  . HYDROCELE EXCISION / REPAIR     "couple times" (04/12/2013)  . LEFT AND RIGHT HEART CATHETERIZATION WITH CORONARY/GRAFT ANGIOGRAM  01/13/2013   Procedure: LEFT AND RIGHT HEART CATHETERIZATION WITH Isabel CapriceORONARY/GRAFT ANGIOGRAM;  Surgeon: Ricki RodriguezAjay S [name], MD;  Location: [facility] CATH LAB;  Service: Cardiovascular;;  . PARACENTESIS  2013-2014   Multiple times, usually pulling off 6-7 liters of fluid  . RETINAL LASER PROCEDURE Bilateral    "more than once on the left" (04/12/2013)  . TRANSTHORACIC ECHOCARDIOGRAM  2013;12/2014;01/2016   LV dilation, EF 30-35%, diffuse  hypokinesis, grade I diast dysfxn, severe mitral and tricuspid regurg, LAE and RAE--biventricular dysfunction moderate/severe. 01/2016 dilated LV with akinesis/hypokinesis diffusely, severe RV dysfxn, severe mitral regurg, severe PA HTN    Outpatient Medications Prior to Visit  Medication Sig Dispense Refill  . calcium carbonate (OSCAL) 1500 (600 Ca) MG TABS tablet Take 600 mg of elemental calcium by mouth daily.     . carvedilol (COREG) 3.125 MG tablet Take 1 tablet (3.125 mg total) by mouth 2 (two) times daily with a meal.    . cholecalciferol (VITAMIN D) 1000 units tablet Take 1,000 Units by mouth daily.    . furosemide (LASIX) 20 MG tablet Take 1 tablet (20 mg total) by mouth 2 (two) times daily. 30 tablet 0  . insulin NPH-regular Human (NOVOLIN 70/30) (70-30) 100 UNIT/ML injection Inject 6 Units into the skin 2 (two) times daily with a meal. 10 mL 11  . LACTULOSE PO Take 45 mLs by mouth 2 (two) times daily. Then give 30 ml at bedtime.    . magnesium oxide (MAG-OX) 400 (241.3 Mg) MG tablet Take 400 mg by mouth daily.     . metolazone (ZAROXOLYN) 5 MG tablet Take 5 mg by mouth daily.     . ondansetron (ZOFRAN) 4 MG tablet Take 4 mg by mouth every 8 (eight) hours as needed for nausea.    . potassium chloride SA (K-DUR,KLOR-CON) 20 MEQ tablet Take 20 mEq by mouth 3 (three) times daily.     Marland Kitchen. spironolactone (ALDACTONE) 25 MG tablet Take 25 mg by mouth daily.     . vitamin B-12 (CYANOCOBALAMIN) 1000 MCG tablet Take 1,000 mcg by mouth daily.    . vitamin C (ASCORBIC ACID) 500 MG tablet Take 500 mg by mouth daily.    . Amino Acids-Protein Hydrolys (FEEDING SUPPLEMENT, PRO-STAT SUGAR FREE 64,) LIQD Take 30 mLs by mouth 2 (two) times daily. To support wound healing    . rifaximin (XIFAXAN) 550 MG TABS tablet Take 1 tablet (550 mg total) by mouth 2 (two) times daily. (Patient not taking: Reported on 11/25/2016) 42 tablet 0   No facility-administered medications prior to visit.     Allergies  Allergen  Reactions  . Adhesive [Tape] Other (See Comments)    Reaction:  Pulls pts skin off   . Sulfa Antibiotics Rash    ROS As per HPI  PE: Blood pressure 109/72, pulse 69, temperature 97.6 F (36.4 C), temperature source Temporal, resp. rate 16, SpO2 100 %. Gen: alert, sitting up in wheelchair, frail-appearing.   AFFECT: pleasant, lucid thought and speech. CV: irreg irreg, rate 70s,  LUNGS: some insp and exp rhonchi that clear  with coughing.  No crackles.  Breathing is nonlabored. ABd: soft, mild distention (examined when in wheelchair due to patient's generalized weakness), non-tender. EXT: no clubbing, cyanosis, or edema.  MUSC: Hips with minimal TTP over greater troch on R.  Minimal discomfort with flexion/extension/rotation of each hip. SKIN: no pallor or jaundice  LABS:   Lab Results  Component Value Date   HGBA1C 7.4 (H) 11/25/2016      Chemistry      Component Value Date/Time   NA 128 (L) 11/25/2016 1529   NA 133 (A) 10/25/2016   K 4.0 11/25/2016 1529   CL 92 (L) 11/25/2016 1529   CO2 27 11/25/2016 1529   BUN 50 (H) 11/25/2016 1529   BUN 57 (A) 10/25/2016   CREATININE 1.54 (H) 11/25/2016 1529   GLU 154 10/25/2016      Component Value Date/Time   CALCIUM 9.3 11/25/2016 1529   ALKPHOS 168 (H) 11/25/2016 1529   AST 35 11/25/2016 1529   ALT 13 11/25/2016 1529   BILITOT 2.0 (H) 11/25/2016 1529     Lab Results  Component Value Date   WBC 5.0 11/25/2016   HGB 14.3 11/25/2016   HCT 42.7 11/25/2016   MCV 92.7 11/25/2016   PLT 119.0 (L) 11/25/2016   Lab Results  Component Value Date   TSH 14.29 (H) 11/25/2016    IMPRESSION AND PLAN:  1) Debilitated patient; generalized weakness, medically frail.  [facility]N is involved.  2) Chronic liver failure (suspected due to chronic hepatic congestion from pulm HTN/RV failure); recent hepatic encephalopathy.  Need to recheck CMET and ammonia levels.  Continue lactulose 45 ml bid and 30ml qhs. Awaiting insurer's approval of  xifaxan.  3) Chronic biventricular systolic HF: appears euvolemic. Continue coreg, aldactone, lasix, metolazone.  He and wife state he will be making f/u appt with Dr. Algie Coffer, his cardiologist, soon.  4) Chronic a-fib: for multiple reasons, he is not a good candidate for anticoagulation.  5) Acute bronchitis: doxycycline 100 mg bid x 7d. Prednisone 40mg  qd x 5d.  6) Subclinical hypothyroidism: recheck TSH today.  7) DM 2: recheck HbA1c today.  Continue current insulin regimen.  8) chronic R hip pain: no sign of acute problem with this today.  An After Visit Summary was printed and given to the patient.  FOLLOW UP: Return in about 1 week (around 12/02/2016) for f/u resp illness/chronic.  Signed:  Santiago Bumpers, MD           11/25/2016

## 2016-11-25 NOTE — Progress Notes (Signed)
Pre visit review using our clinic review tool, if applicable. No additional management support is needed unless otherwise documented below in the visit note. 

## 2016-11-26 ENCOUNTER — Other Ambulatory Visit: Payer: Self-pay | Admitting: Family Medicine

## 2016-11-26 ENCOUNTER — Other Ambulatory Visit (INDEPENDENT_AMBULATORY_CARE_PROVIDER_SITE_OTHER): Payer: PPO

## 2016-11-26 DIAGNOSIS — E039 Hypothyroidism, unspecified: Secondary | ICD-10-CM | POA: Diagnosis not present

## 2016-11-26 DIAGNOSIS — E1122 Type 2 diabetes mellitus with diabetic chronic kidney disease: Secondary | ICD-10-CM | POA: Diagnosis not present

## 2016-11-26 DIAGNOSIS — I519 Heart disease, unspecified: Principal | ICD-10-CM

## 2016-11-26 DIAGNOSIS — K7290 Hepatic failure, unspecified without coma: Secondary | ICD-10-CM | POA: Diagnosis not present

## 2016-11-26 DIAGNOSIS — I110 Hypertensive heart disease with heart failure: Secondary | ICD-10-CM | POA: Diagnosis not present

## 2016-11-26 DIAGNOSIS — K761 Chronic passive congestion of liver: Secondary | ICD-10-CM | POA: Diagnosis not present

## 2016-11-26 DIAGNOSIS — I43 Cardiomyopathy in diseases classified elsewhere: Secondary | ICD-10-CM

## 2016-11-26 DIAGNOSIS — I5082 Biventricular heart failure: Secondary | ICD-10-CM | POA: Diagnosis not present

## 2016-11-26 DIAGNOSIS — N183 Chronic kidney disease, stage 3 (moderate): Secondary | ICD-10-CM | POA: Diagnosis not present

## 2016-11-26 DIAGNOSIS — I2729 Other secondary pulmonary hypertension: Secondary | ICD-10-CM | POA: Diagnosis not present

## 2016-11-26 LAB — T4, FREE: FREE T4: 0.93 ng/dL (ref 0.60–1.60)

## 2016-11-26 LAB — T3, FREE: T3 FREE: 2.5 pg/mL (ref 2.3–4.2)

## 2016-11-26 MED ORDER — RIFAXIMIN 550 MG PO TABS: 550.0000 mg | ORAL_TABLET | Freq: Two times a day (BID) | ORAL | 3 refills | Status: DC

## 2016-11-26 NOTE — Telephone Encounter (Signed)
wife calling asking if Dr. Milinda CaveMcGowen could call in xifaxan for pt, [facility] [address]

## 2016-11-26 NOTE — Telephone Encounter (Signed)
RF request for xifaxan  LOV: 11/26/16 Next ov: 12/02/16 Last written: 10/17/16 #42 w/ 0RF, written by Dr. Rinaldo RatelKadolph  Please advise. Thanks.

## 2016-11-26 NOTE — Telephone Encounter (Signed)
Pts wife advised and voiced understanding.  

## 2016-11-27 ENCOUNTER — Other Ambulatory Visit: Payer: Self-pay | Admitting: Pharmacist

## 2016-11-27 DIAGNOSIS — K7290 Hepatic failure, unspecified without coma: Secondary | ICD-10-CM | POA: Diagnosis not present

## 2016-11-27 DIAGNOSIS — E1122 Type 2 diabetes mellitus with diabetic chronic kidney disease: Secondary | ICD-10-CM | POA: Diagnosis not present

## 2016-11-27 DIAGNOSIS — K761 Chronic passive congestion of liver: Secondary | ICD-10-CM | POA: Diagnosis not present

## 2016-11-27 DIAGNOSIS — N183 Chronic kidney disease, stage 3 (moderate): Secondary | ICD-10-CM | POA: Diagnosis not present

## 2016-11-27 DIAGNOSIS — I2729 Other secondary pulmonary hypertension: Secondary | ICD-10-CM | POA: Diagnosis not present

## 2016-11-27 DIAGNOSIS — I110 Hypertensive heart disease with heart failure: Secondary | ICD-10-CM | POA: Diagnosis not present

## 2016-11-27 DIAGNOSIS — I5082 Biventricular heart failure: Secondary | ICD-10-CM | POA: Diagnosis not present

## 2016-11-27 NOTE — Patient Outreach (Signed)
[facility]) Care Management  11/27/2016  Vallery RidgeRaymond Fleisher [birth year] [id number]  [facility] Teleph[facility] for medication assistance evaluation, specifically for rifaximin.   Phone call to patient made, no answer, unable to leave voicemail.  Received a call back from patient's phone number, it was his spouse, [name], whom [facility] note, patient has consented be spoken with.  HIPAA details verified.   Spouse reports patient has not obtained rifaximin as it is still needing prior authorization.  Spouse reports unable to locate patient's Medicare card to allow [facility] Pharmacist to verify if patient has SSA Extra Help.    [name] and was told prior authorization had not been completed yet and was previously sent to SNF doctor.  Requested prior authorization request be sent to PCP office whom issued rifaximin prescription 11/26/16.    Per 11/25/16 PCP note, PCP aware of need for insurance to approve rifaximin.    Plan:  Will follow-up with patient/spouse in the next 2 weeks.    If prior authorization is obtained and patient does not have SSA Extra Help, patient may need to consider applying to manufacturer patient assistance program if rifaximin remains expensive.   Tommye StandardKevin Tayleigh Wetherell, PharmD, [facility] Clinical Pharmacist [facility] [phone]

## 2016-11-28 ENCOUNTER — Telehealth: Payer: Self-pay | Admitting: *Deleted

## 2016-11-28 ENCOUNTER — Telehealth: Payer: Self-pay | Admitting: Family Medicine

## 2016-11-28 ENCOUNTER — Encounter: Payer: Self-pay | Admitting: Family Medicine

## 2016-11-28 DIAGNOSIS — R5381 Other malaise: Secondary | ICD-10-CM | POA: Insufficient documentation

## 2016-11-28 NOTE — Telephone Encounter (Signed)
Eric with Sharlett [facility]inder at [facility] on 11/27/16 at 3:47pm requesting verbal order for PT 2 days a week x 5 weeks for ambulation, strengthening, transfer training, and home safety.   Minerva Areolaric also left an [place], stating that pt will need a Child psychotherapistsocial worker at night for when pts wife goes back to work. He stated that they are contacting pts insurance company to see if they will cover a Child psychotherapistsocial worker.   Verbal order given for PT as requested above.

## 2016-11-28 NOTE — Telephone Encounter (Signed)
Please advise. Thanks.  

## 2016-11-28 NOTE — Telephone Encounter (Signed)
SW pts wife because [facility] does not bill pts insurance they are out of pocket only. I advised pts wife that I could check with other Medical Supply stores in the [place] area and see if one of them will file insurance, she agreed.   SW pts wife and advised her that [facility] will bill pts insurance for the medical supply's. She agreed to use them. I advised her that we will fax over Rx, pt demographic and a copy of pts insurance card.  Rx and other items faxed to [facility].

## 2016-11-28 NOTE — Telephone Encounter (Deleted)
-----   Message from Philip H McGowen, MD sent at 11/28/2016 12:17 PM EST ----- Regarding: FW: bedside commode I wrote rx.  Pls fax to [facility] for pt. -thx ----- Message ----- From: Carroll Spinks, NP Sent: 11/28/2016  11:46 AM To: Philip H McGowen, MD Subject: RE: bedside commode                            Dr. McGowen, I spoke to Mrs. Tunney this am and advised her she should call/visit [facility] to get Mr. Kamara his bedside commode. I don't think insurance covers this item any longer. Also she said he needs a wheelchair. I cannot order DME, Would you kindly send an order via fax to them for a wheelchair and a bedside commode? It doesn't hurt to try to get the BSC covered. Kindly, Carroll Spinks  ----- Message ----- From: Philip H McGowen, MD Sent: 11/25/2016   6:33 PM To: Carroll Spinks, NP Subject: bedside commode                                Can you help this patient get set up with a bedside commode for his home?  His wife has trouble helping him get up and get to the bathroom, and we are making him have 3-4 BMs per day with the lactulose we're giving him.  Thank you. --PM   

## 2016-11-28 NOTE — Telephone Encounter (Signed)
Shelba Flakearol Harvey from [facility] called and stated that she needs an order put in for patient for social worker to educate on community resources. Please call Okey RegalCarol back once this is completed at [phone].  Thank you.

## 2016-11-28 NOTE — Telephone Encounter (Signed)
-----   Message from Francisco MassedPhilip H McGowen, MD sent at 11/28/2016 12:17 PM EST ----- Regarding: FW: bedside commode I wrote rx.  [facility]. -thx ----- Message ----- From: Francisco Lovelyarroll Spinks, NP Sent: 11/28/2016  11:46 AM To: Francisco MassedPhilip H McGowen, MD Subject: RE: bedside commode                            Dr. Milinda CaveMcGowen, I spoke to [name] this am and advised her she should call/visit [facility] to get [name] his bedside commode. I don't think insurance covers this item any longer. Also she said he needs a wheelchair. I cannot order DME, Would you kindly send an order via fax to them for a wheelchair and a bedside commode? It doesn't hurt to try to get the [facility]. Francisco RalphsKindly, [name]  ----- Message ----- From: Francisco MassedPhilip H McGowen, MD Sent: 11/25/2016   6:33 PM To: Francisco Lovelyarroll Spinks, NP Subject: bedside commode                                Can you help this patient get set up with a bedside commode for his home?  His wife has trouble helping him get up and get to the bathroom, and we are making him have 3-4 BMs per day with the lactulose we're giving him.  Thank you. --PM

## 2016-11-29 ENCOUNTER — Other Ambulatory Visit: Payer: Self-pay

## 2016-11-29 NOTE — Patient Outreach (Signed)
[facility]N) Care Management  11/29/2016  [name] 06/01/1941 [id number]   Referral:  11/21/16  Source:  [facility] Utilization management Department Issue:  SNF discharge 11/19/16.  H/o Francisco DrownMary Mueller, [facility]  unable to reach wife and left a brochure with patient in the facility.    H/o Patient with wounds; 2 pressure and 1 venous. Concerns regarding support from wife for patient and their needs.   H[facility]o [facility] 11/18/16 Telephonic Screening, Transition of Care and Initial Assessment 11/22/16 Telephonic RN CM Services:  11/22/16 Transition of Care Services (Short Term):  11/22/16 Program:  Other:  Infection Prevention. 11/22/16  [address]  [place] [id number] [phone] (M) Wife/caregiver:  Fontaine NoNancy Mueller  Subjective:   Patient reached and completed call.  Patient states wife, [name] is sleeping and had to work last night.  Patient confirms no updates on the prior authorization for his medication and does not have any updates on his W/C.    Infection Prevention -Disease management and education: -Skin/Wound care management/healing -Fall prevention: (last fall date 09/2016).  Monitor HH services:  RN, PT -DME:  W/C needs pending.  PT visit scheduled for next week.  (may need to get to the car for MD appt's)  Primary MD:  Dr. Nicoletta BaPhilip McGowen  -  last appt: 07/11/16     next appt: 11/25/16   [facility] (11/22/16 -remains active) -Patient having issue getting prior prior authorization for Xafaxan and is not taking since [facility].  Issue:  Patients pharmacy sending to SNF provider rather than PCP.  [facility] Pharmacist has notified Pharmacy to send to PCP.   RN CM will continue to follow progress.    -Advance Directives: none -RN CM will continue to engage patient / wife in Advance Directive discussion and education.   DME: Mobility: ambulates with walker in the house but difficulty getting from house to car for  appointments.  No W/C in the home.  Wife states she thinks Cardiologist is sending an order to [facility] for W/C.   Medications:  Patient unable to provide medication reconciliation and states his wife will need to do this with RN CM as she manages his medications.   RN CM requested call back from wife, [name] with updates on the above.  RN CM advised in next [facility] scheduled contact call within one week for Transition of care services.  RN CM advised to please notify MD of any changes in condition prior to scheduled appt's.   RN CM provided contact name and # [phone] or main office # [phone] and 24-hour nurse line # [phone].  RN CM confirmed patient is aware of 911 services for urgent emergency needs.  Simmie Daviesrystal Lyrica Mcclarty, MSHL, BSN, RN, CCM  [facility] Care Management Care Management Coordinator [phone] Direct [phone] Cell [phone] Office [phone] Fax [email or web address]

## 2016-12-02 ENCOUNTER — Ambulatory Visit: Payer: PPO | Admitting: Family Medicine

## 2016-12-02 DIAGNOSIS — K761 Chronic passive congestion of liver: Secondary | ICD-10-CM | POA: Diagnosis not present

## 2016-12-02 DIAGNOSIS — I5082 Biventricular heart failure: Secondary | ICD-10-CM | POA: Diagnosis not present

## 2016-12-02 DIAGNOSIS — E1122 Type 2 diabetes mellitus with diabetic chronic kidney disease: Secondary | ICD-10-CM | POA: Diagnosis not present

## 2016-12-02 DIAGNOSIS — I2729 Other secondary pulmonary hypertension: Secondary | ICD-10-CM | POA: Diagnosis not present

## 2016-12-02 DIAGNOSIS — I110 Hypertensive heart disease with heart failure: Secondary | ICD-10-CM | POA: Diagnosis not present

## 2016-12-02 DIAGNOSIS — N183 Chronic kidney disease, stage 3 (moderate): Secondary | ICD-10-CM | POA: Diagnosis not present

## 2016-12-02 DIAGNOSIS — K7290 Hepatic failure, unspecified without coma: Secondary | ICD-10-CM | POA: Diagnosis not present

## 2016-12-03 DIAGNOSIS — R5381 Other malaise: Secondary | ICD-10-CM | POA: Diagnosis not present

## 2016-12-04 ENCOUNTER — Encounter: Payer: Self-pay | Admitting: Pharmacist

## 2016-12-04 ENCOUNTER — Other Ambulatory Visit: Payer: Self-pay | Admitting: Pharmacist

## 2016-12-04 NOTE — Patient Outreach (Signed)
[facility](THN) Care Management  12/04/2016  [name] 19-Mar-1941 [id number]  Follow-up call to patient's spouse, HIPAA details verified.   Spouse reports no update on prior authorization for patient's rifaximin.  She reports they have not heard from his pharmacy or insurance.  She states patient has PCP appointment 12/06/16.   Plan:  Will follow-up with patient's pharmacy to see if they can process prescription.  Will contact PCP office if prior authorization is not done to see if PCP can do prior authorization.    Will continue to follow-up with patient.   Tommye StandardKevin Mariela Rex, PharmD, [facility] Clinical Pharmacist [facility] [phone]

## 2016-12-06 ENCOUNTER — Encounter: Payer: Self-pay | Admitting: Family Medicine

## 2016-12-06 ENCOUNTER — Other Ambulatory Visit: Payer: Self-pay

## 2016-12-06 ENCOUNTER — Ambulatory Visit (INDEPENDENT_AMBULATORY_CARE_PROVIDER_SITE_OTHER): Payer: PPO | Admitting: Family Medicine

## 2016-12-06 VITALS — BP 100/68 | HR 63 | Temp 97.2°F | Resp 16 | Ht 63.5 in | Wt 126.5 lb

## 2016-12-06 DIAGNOSIS — I5082 Biventricular heart failure: Secondary | ICD-10-CM

## 2016-12-06 DIAGNOSIS — K7290 Hepatic failure, unspecified without coma: Secondary | ICD-10-CM | POA: Diagnosis not present

## 2016-12-06 DIAGNOSIS — K7682 Hepatic encephalopathy: Secondary | ICD-10-CM

## 2016-12-06 DIAGNOSIS — K7210 Chronic hepatic failure without coma: Secondary | ICD-10-CM

## 2016-12-06 DIAGNOSIS — Z794 Long term (current) use of insulin: Secondary | ICD-10-CM

## 2016-12-06 DIAGNOSIS — J209 Acute bronchitis, unspecified: Secondary | ICD-10-CM

## 2016-12-06 DIAGNOSIS — J410 Simple chronic bronchitis: Secondary | ICD-10-CM

## 2016-12-06 DIAGNOSIS — E118 Type 2 diabetes mellitus with unspecified complications: Secondary | ICD-10-CM

## 2016-12-06 LAB — COMPREHENSIVE METABOLIC PANEL
ALT: 22 U/L (ref 0–53)
AST: 36 U/L (ref 0–37)
Albumin: 3.5 g/dL (ref 3.5–5.2)
Alkaline Phosphatase: 143 U/L — ABNORMAL HIGH (ref 39–117)
BILIRUBIN TOTAL: 2.1 mg/dL — AB (ref 0.2–1.2)
BUN: 79 mg/dL — ABNORMAL HIGH (ref 6–23)
CO2: 28 mEq/L (ref 19–32)
Calcium: 9.4 mg/dL (ref 8.4–10.5)
Chloride: 88 mEq/L — ABNORMAL LOW (ref 96–112)
Creatinine, Ser: 1.49 mg/dL (ref 0.40–1.50)
GFR: 48.65 mL/min — AB (ref >60.00)
Glucose, Bld: 285 mg/dL — ABNORMAL HIGH (ref 70–99)
Potassium: 3.8 mEq/L (ref 3.5–5.1)
Sodium: 125 mEq/L — ABNORMAL LOW (ref 135–145)
Total Protein: 8.4 g/dL — ABNORMAL HIGH (ref 6.0–8.3)

## 2016-12-06 LAB — AMMONIA: Ammonia: 124 umol/L — ABNORMAL HIGH (ref 11–35)

## 2016-12-06 MED ORDER — INSULIN NPH ISOPHANE & REGULAR (70-30) 100 UNIT/ML ~~LOC~~ SUSP: SUBCUTANEOUS | 11 refills | Status: DC

## 2016-12-06 NOTE — Progress Notes (Signed)
Pre visit review using our clinic review tool, if applicable. No additional management support is needed unless otherwise documented below in the visit note. 

## 2016-12-06 NOTE — Patient Instructions (Signed)
Change 7030 insulin dosing to 9 U at breakfast and 6 units at supper.  Change metolazone (zaroxolyn) to 5mg  tab three days per week (M/W/F for example).

## 2016-12-06 NOTE — Patient Outreach (Signed)
[facility]N) Care Management  12/06/2016  [name] [birth year] [id number]  Telephonic Transition of Care Services  Referral:  11/21/16  Source:  [facility] Utilization [facility] Department Issue:  SNF discharge 11/19/16.  H/o Verdie DrownMary Niemczura, [facility]  unable to reach wife and left a brochure with patient in the facility.    H/o Patient with wounds; 2 pressure and 1 venous. Concerns regarding support from wife for patient and their needs.   [facility] 11/18/16 Telephonic Screening, Transition of Care and Initial Assessment:   11/22/16 Telephonic RN CM Services:  11/22/16 Transition of Care Services (Short Term):  11/22/16 Program:   Infection Prevention:  11/22/16  Outreach call #1 to patient.   Patient not reached and unable to leave voice message.  MR review:  Patient has PCP appt this morning.   RN CM scheduled for next outreach call within one week.   [name], MSHL, BSN, RN, CCM  [facility] Care Management Care Management Coordinator [phone] Direct [phone] Cell [phone] Office [phone] Fax [email or web address] [email or web address]

## 2016-12-06 NOTE — Progress Notes (Signed)
OFFICE VISIT  12/06/2016   CC:  Chief Complaint  Patient presents with  . Follow-up    Respiratory Illness    HPI:    Patient is a 76 y.o. Caucasian male who presents for 11 day f/u acute-on-chronic bronchitis, chronic liver failure with hepatic encephalopathy, chronic biventricular HF, and DM 2. Last visit I rx'd prednisone 40mg  qd x 5d and doxy 100 bidx 7d. He was also OFF of his xifaxan at that time due to insurance coverage issues, so we increased his lactulose to 45 ml qid.  Since then he has had his xifaxan approved and has restarted it.  His TSH came back elevated (see labs below) but T4 and T3 were normal.  With his dx of a-fib, I want to hold off on starting any levothyroxine at this time.  Interim Hx: due to excessive stooling his wife decreased his lactulose to 45ml bid.  Stooling slowed down to 4-5 per day.  He did get a bedside commode.  Cough is significantly improved.  Some clear/light yellow sputum.  No fever.  No pain anywhere.  DM: glucose this morning was 184 fasting.  Evening around 200s. He is eating fine.     Past Medical History:  Diagnosis Date  . Atrial fibrillation (HCC)    ?GI bleed in past, plus pt tends to "self adjust/manage" his meds w/out regard for MD's guidance, so he has NOT been on anticoagulant for this.  . CHF (congestive heart failure) (HCC)   . Chronic bronchitis (HCC)    "used to get it often; haven't had it for awhile now" (04/12/2013)  . Chronic renal insufficiency, stage III (moderate)    GFR 40s  . Chronic systolic heart failure (HCC)   . 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  Hospitalized with hepatic encephalopathy 09/2016.  Marland Kitchen Coronary artery disease   . Gout   . Heart murmur   . High cholesterol   . Hypertension   . Pneumonia   . Positive TB test    "but I don't have it"  . Postpolio syndrome 1951   "affected the right side of my body; been  weak on that side ever since" (04/12/2013)  . 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 Dione Booze)  . Pulmonary embolism (HCC) 1980's or 1990's  . Severe mitral regurgitation 2013  . Severe tricuspid regurgitation 2013  . Subclinical hypothyroidism   . Type II diabetes mellitus (HCC)   . Umbilical hernia 02/2015   Symptomatic--Dr. Corliss Skains to repair if cleared by cardiologist    Past Surgical History:  Procedure Laterality Date  . APPENDECTOMY  ~ 1962  . CARDIAC CATHETERIZATION     "I've had a few since 1989" (04/12/2013)  . CARDIAC CATHETERIZATION  12/2012   Severe native CAD, patent bypass grafts x 3, EF 25-30%--med mgmt  . CATARACT EXTRACTION W/ INTRAOCULAR LENS  IMPLANT, BILATERAL Bilateral 2005  . CERVICAL DISCECTOMY  1984   C6-7  . CORONARY ARTERY BYPASS GRAFT  01/1988; 05/1999   CABG X 3; CABG X 2  . EXPLORATORY LAPAROTOMY  ~ 1962   "took out my appendix while there; bowel was inflammed" (04/12/2013)  . HYDROCELE EXCISION / REPAIR     "couple times" (04/12/2013)  . LEFT AND RIGHT HEART CATHETERIZATION WITH CORONARY/GRAFT ANGIOGRAM  01/13/2013   Procedure: LEFT AND RIGHT HEART CATHETERIZATION WITH CORONARY/GRAFT ANGIOGRAM;  Surgeon: [name],  MD;  Location: [facility] CATH LAB;  Service: Cardiovascular;;  . PARACENTESIS  2013-2014   Multiple times, usually pulling off 6-7 liters of fluid  . RETINAL LASER PROCEDURE Bilateral    "more than once on the left" (04/12/2013)  . TRANSTHORACIC ECHOCARDIOGRAM  2013;12/2014;01/2016   LV dilation, EF 30-35%, diffuse hypokinesis, grade I diast dysfxn, severe mitral and tricuspid regurg, LAE and RAE--biventricular dysfunction moderate/severe. 01/2016 dilated LV with akinesis/hypokinesis diffusely, severe RV dysfxn, severe mitral regurg, severe PA HTN    Outpatient Medications Prior to Visit  Medication Sig Dispense Refill  . calcium carbonate (OSCAL) 1500 (600 Ca) MG TABS tablet Take 600 mg of elemental calcium  by mouth daily.     . carvedilol (COREG) 3.125 MG tablet Take 1 tablet (3.125 mg total) by mouth 2 (two) times daily with a meal.    . cholecalciferol (VITAMIN D) 1000 units tablet Take 1,000 Units by mouth daily.    . famotidine (PEPCID) 20 MG tablet Take 20 mg by mouth 2 (two) times daily.    . furosemide (LASIX) 20 MG tablet Take 1 tablet (20 mg total) by mouth 2 (two) times daily. 30 tablet 0  . LACTULOSE PO Take 45 mLs by mouth 2 (two) times daily. Then give 30 ml at bedtime.    . magnesium oxide (MAG-OX) 400 (241.3 Mg) MG tablet Take 400 mg by mouth daily.     . metolazone (ZAROXOLYN) 5 MG tablet Take 5 mg by mouth 3 (three) times a week.    . ondansetron (ZOFRAN) 4 MG tablet Take 4 mg by mouth every 8 (eight) hours as needed for nausea.    . potassium chloride SA (K-DUR,KLOR-CON) 20 MEQ tablet Take 20 mEq by mouth 3 (three) times daily.     Marland Kitchen spironolactone (ALDACTONE) 25 MG tablet Take 25 mg by mouth daily.     . vitamin B-12 (CYANOCOBALAMIN) 1000 MCG tablet Take 1,000 mcg by mouth daily.    . vitamin C (ASCORBIC ACID) 500 MG tablet Take 500 mg by mouth daily.    . insulin NPH-regular Human (NOVOLIN 70/30) (70-30) 100 UNIT/ML injection Inject 6 Units into the skin 2 (two) times daily with a meal. 10 mL 11  . rifaximin (XIFAXAN) 550 MG TABS tablet Take 1 tablet (550 mg total) by mouth 2 (two) times daily. (Patient not taking: Reported on 12/06/2016) 60 tablet 3  . Amino Acids-Protein Hydrolys (FEEDING SUPPLEMENT, PRO-STAT SUGAR FREE 64,) LIQD Take 30 mLs by mouth 2 (two) times daily. To support wound healing    . doxycycline (VIBRA-TABS) 100 MG tablet Take 1 tablet (100 mg total) by mouth 2 (two) times daily. (Patient not taking: Reported on 12/06/2016) 14 tablet 0  . predniSONE (DELTASONE) 20 MG tablet 2 tabs po qd x 5d (Patient not taking: Reported on 12/06/2016) 10 tablet 0   No facility-administered medications prior to visit.     Allergies  Allergen Reactions  . Adhesive [Tape] Other  (See Comments)    Reaction:  Pulls pts skin off   . Sulfa Antibiotics Rash    ROS As per HPI  PE: Blood pressure 100/68, pulse 63, temperature 97.2 F (36.2 C), temperature source Oral, resp. rate 16, height 5' 3.5" (1.613 m), weight 126 lb 8 oz (57.4 kg), SpO2 100 %. Gen: sitting up in wheelchair.  Oriented to person, place, time, situation.   LABS:    Chemistry      Component Value Date/Time   NA 128 (L) 11/25/2016 1529  NA 133 (A) 10/25/2016   K 4.0 11/25/2016 1529   CL 92 (L) 11/25/2016 1529   CO2 27 11/25/2016 1529   BUN 50 (H) 11/25/2016 1529   BUN 57 (A) 10/25/2016   CREATININE 1.54 (H) 11/25/2016 1529   GLU 154 10/25/2016      Component Value Date/Time   CALCIUM 9.3 11/25/2016 1529   ALKPHOS 168 (H) 11/25/2016 1529   AST 35 11/25/2016 1529   ALT 13 11/25/2016 1529   BILITOT 2.0 (H) 11/25/2016 1529     Lab Results  Component Value Date   WBC 5.0 11/25/2016   HGB 14.3 11/25/2016   HCT 42.7 11/25/2016   MCV 92.7 11/25/2016   PLT 119.0 (L) 11/25/2016   Ammonia 11/25/16= 129  Lab Results  Component Value Date   HGBA1C 7.4 (H) 11/25/2016   IMPRESSION AND PLAN:  1) Acute-on-chronic bronchitis: much improved s/p prednisone burst and doxy x 7d.  2) Hepatic encephalopathy/chronic liver failure: he has cognitive slowing but is not in any delirium. He is to pick up his xifaxan and start this today.  Continue lactulose 15mg  bid--this elicits 4-5 BMs per day. Recheck CMET and ammonia level today.  3) Chronic biventricular HF: appears euvolemic.  BP low normal. Will cut his metolazone back to 5mg  three days a week (such as M/W/F).  Continue lasix 20mg  bid, coreg 3.125 mg bid, aldactone 25mg  qd.  Has to arrange f/u soon with cardiologist, who is presently out of town (Dr. Algie CofferKadakia).  4) DM 2; not ideal control lately.  Last A1c 11/25/16 was 7.4%.   He is notorious for making up nonsensical insulin regimens for himself. I told him to stop giving himself any regular  insulin. I recommended he take 70/30 insulin 9 U q BF and 6 U q Supper.  An After Visit Summary was printed and given to the patient.  FOLLOW UP: Return in about 4 weeks (around 01/03/2017) for routine chronic illness f/u.  Signed:  Santiago BumpersPhil Tamika Nou, MD           12/06/2016

## 2016-12-09 ENCOUNTER — Other Ambulatory Visit: Payer: Self-pay | Admitting: Pharmacist

## 2016-12-09 ENCOUNTER — Other Ambulatory Visit: Payer: Self-pay

## 2016-12-09 ENCOUNTER — Encounter: Payer: Self-pay | Admitting: Pharmacist

## 2016-12-09 DIAGNOSIS — I110 Hypertensive heart disease with heart failure: Secondary | ICD-10-CM | POA: Diagnosis not present

## 2016-12-09 DIAGNOSIS — I5082 Biventricular heart failure: Secondary | ICD-10-CM | POA: Diagnosis not present

## 2016-12-09 DIAGNOSIS — K761 Chronic passive congestion of liver: Secondary | ICD-10-CM | POA: Diagnosis not present

## 2016-12-09 DIAGNOSIS — E1122 Type 2 diabetes mellitus with diabetic chronic kidney disease: Secondary | ICD-10-CM | POA: Diagnosis not present

## 2016-12-09 DIAGNOSIS — I2729 Other secondary pulmonary hypertension: Secondary | ICD-10-CM | POA: Diagnosis not present

## 2016-12-09 DIAGNOSIS — N183 Chronic kidney disease, stage 3 (moderate): Secondary | ICD-10-CM | POA: Diagnosis not present

## 2016-12-09 DIAGNOSIS — K7290 Hepatic failure, unspecified without coma: Secondary | ICD-10-CM | POA: Diagnosis not present

## 2016-12-09 NOTE — Patient Outreach (Signed)
[facility]N) Care Management  12/09/2016  Francisco RidgeRaymond Mueller 07-30-1941 [id number]  Telephone follow up call to patient following diischarge from skilled facility to home. Spouse/caregiver  took call & advised that patient is resting.  Spouse voices that patient has completed primary care visit with Dr Marvel PlanMcGowan 12/06/2016. States patient has completed antibiotics for bronchitis that was prescribed 2 weeks ago. Voices that she continues to manage patient's medications & that he is taking them without problems.   States he continues to receive home health services of RN & physical therapist.  States patient is able to use walker but still needs assistance to make sure he does not fall. Voices that his lightweight wheel chair was delivered last week.  Voices that he has not used it yet.  Advised caregiver to consult home physical therapist if she has questions about operating chair. Caregiver states she does not  think she will have any problems but will consult therapist if needed.   Spouse voices that patient has not had any hospital admissions or emergency room visits since discharge from skilled facility.  States she is transporting patient to follow up MD appointments.   Caregiver states patient still has some weakness but condition is improving with home therapy.  States appetite is good.  Voices no further concerns at this time.  Plan: Will report this encounter to primary RN care coordinator -Hutchinson, Primary care Coordinator -Hutchinson will follow up.   Colleen CanLinda Davonte Siebenaler, RN BSN CCM (coverage partner) Care Management Coordinator [facility] Care [facility]  [phone]

## 2016-12-09 NOTE — Patient Outreach (Addendum)
[facility]) Care Management  12/09/2016  [name] [birth year] [id number]  Unsuccessful attempt to follow-up with patient/caregiver regarding rifaximin.  [name] and was told prior authorization was completed but medication still expensive.    HIPAA compliant message left requesting call back.    Plan:  If no return call, will make another outreach attempt in the next week.   [name], PharmD, [facility]BCACP Clinical Pharmacist [facility] [phone]   Addendum:  Inbound call received from patient's spouse, [name].  She verified patient's HIPAA details and per [facility] in chart on 11/22/16 patient has consented to his spouse be spoken with.   Spouse reports she went to [name] and found out how expensive rifaximin was.  Discussed SSA Extra Help, spouse reports household income exceeds requirements.   Discussed [name]---program doesn't disclose requirements but patient can apply to see if he is eligible for assistance from manufacturer for rifaximin.   Spouse reports interested in applying to patient assistance program.   Plan:  [name] application mailed to patient.   Will fax prescriber portion of application.   Will continue to follow-up to see if patient is able to get manufacturer patient assistance for rifaximin.    Tommye StandardKevin [name], PharmD, [facility]ACP Clinical Pharmacist [facility] [phone]

## 2016-12-12 ENCOUNTER — Telehealth: Payer: Self-pay | Admitting: Family Medicine

## 2016-12-12 NOTE — Telephone Encounter (Signed)
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 Okey RegalCarol stated pts wife wasn't letting her Okey Regal(Carol) look at the wound when she has came to check on pt. Okey RegalCarol stated that today the wife let her look at it because it wasn't healing. Okey RegalCarol wants to know how this should be treated. Please advise. Thanks.   Okey RegalCarol will be out tomorrow. Please call office and speak to Wyvonne LenzNichole Rogers [phone].

## 2016-12-12 NOTE — Telephone Encounter (Signed)
Shelba Flakearol Harvey RN with [facility] needs clarification on how to clean patient wound.   She would like a call back.   Thank you,  -LL

## 2016-12-13 ENCOUNTER — Other Ambulatory Visit: Payer: Self-pay | Admitting: Family Medicine

## 2016-12-13 ENCOUNTER — Ambulatory Visit: Payer: PPO | Admitting: Pharmacist

## 2016-12-13 DIAGNOSIS — L89513 Pressure ulcer of right ankle, stage 3: Secondary | ICD-10-CM

## 2016-12-13 NOTE — Telephone Encounter (Signed)
Detailed message was left on voice mail given.

## 2016-12-13 NOTE — Telephone Encounter (Signed)
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  I'll put referral order in now. In the meantime, wet-to-dry dressing changed twice a day should be fine.-thx

## 2016-12-16 ENCOUNTER — Other Ambulatory Visit: Payer: Self-pay

## 2016-12-16 ENCOUNTER — Telehealth: Payer: Self-pay | Admitting: Family Medicine

## 2016-12-16 DIAGNOSIS — K7290 Hepatic failure, unspecified without coma: Secondary | ICD-10-CM | POA: Diagnosis not present

## 2016-12-16 DIAGNOSIS — I2729 Other secondary pulmonary hypertension: Secondary | ICD-10-CM | POA: Diagnosis not present

## 2016-12-16 DIAGNOSIS — I5082 Biventricular heart failure: Secondary | ICD-10-CM | POA: Diagnosis not present

## 2016-12-16 DIAGNOSIS — K761 Chronic passive congestion of liver: Secondary | ICD-10-CM | POA: Diagnosis not present

## 2016-12-16 DIAGNOSIS — I110 Hypertensive heart disease with heart failure: Secondary | ICD-10-CM | POA: Diagnosis not present

## 2016-12-16 DIAGNOSIS — N183 Chronic kidney disease, stage 3 (moderate): Secondary | ICD-10-CM | POA: Diagnosis not present

## 2016-12-16 DIAGNOSIS — E1122 Type 2 diabetes mellitus with diabetic chronic kidney disease: Secondary | ICD-10-CM | POA: Diagnosis not present

## 2016-12-16 NOTE — Patient Outreach (Signed)
[facility]) Care Management  12/16/2016  Francisco RidgeRaymond Mueller 1941/05/04 [id number]   Telephonic Transition of Care Services  Referral:  11/21/16  [facility]  [facility] Department Issue:  SNF discharge 11/19/16.     H/o Patient with wounds; 2 pressure and 1 venous. Concerns regarding support from wife for patient and their needs.   H/o patient discharged from [facility] 11/18/16 Telephonic Screening, Transition of Care and Initial Assessment 11/22/16 Telephonic RN CM Services:  11/22/16 Transition of Care Services (Short Term):  11/22/16 Program:  Other:  Infection Prevention. 11/22/16  [address]  [place] [id number] [phone] (M) Wife/caregiver:  [name]  Outreach call #1.  Patient / caregiver not reached.  No answer following multiple rings.  RN CM scheduled for next outreach call within one week.   [name], MSHL, BSN, RN, CCM  [facility] Care Management Care Management Coordinator [phone] Direct [phone] Cell [phone] Office [phone] Fax [name]

## 2016-12-16 NOTE — Telephone Encounter (Signed)
Please advise. Thanks.  

## 2016-12-16 NOTE — Telephone Encounter (Signed)
Joni Reiningicole called from [facility] about orders for patient's stage 3 wound but needs an order placed for frequency for changes. Possibly 3x week requested to continue to educate patient's wife.   Please call Joni Reiningicole back at [phone] to advise

## 2016-12-16 NOTE — Telephone Encounter (Signed)
Joni Reiningicole advised and voiced understanding. She stated that pt has an apt with wound care on 12/30/16.

## 2016-12-16 NOTE — Telephone Encounter (Signed)
Yes, give orders to change dressing 3 times per week. I referred him to wound clinic.  Does he have appt yet?--thx

## 2016-12-16 NOTE — Telephone Encounter (Signed)
Noted  

## 2016-12-17 ENCOUNTER — Other Ambulatory Visit: Payer: Self-pay

## 2016-12-17 NOTE — Patient Outreach (Signed)
[facility]) Care Management  12/17/2016  Francisco RidgeRaymond Mueller 10-Feb-1941 [id number]   Telephonic Transition of Care Services  Referral:  11/21/16  [facility]  [facility] Department Issue:  SNF discharge 11/19/16.     H/o Patient with wounds; 2 pressure and 1 venous. Concerns regarding support from wife for patient and their needs.   H/o [facility] 11/18/16 Telephonic Screening, Transition of Care and Initial Assessment 11/22/16 Telephonic RN CM Services:  11/22/16 Transition of Care Services (Short Term):  11/22/16 Program:  Other:  Infection Prevention. 11/22/16  [address]  [place] [address] [phone] (M) Wife/caregiver:  Fontaine NoNancy Hoaglin  Outreach call #2.  Patient / caregiver not reached.   RN CM unable to leave message; voice mail box has not been set up yet.   RN CM scheduled for next outreach call within one week.   Simmie Daviesrystal Correen Bubolz, MSHL, BSN, RN, CCM  [facility] Care Management Care Management Coordinator [phone] Direct [phone] Cell [phone] Office [phone] Fax [email or web address]

## 2016-12-18 ENCOUNTER — Other Ambulatory Visit: Payer: Self-pay

## 2016-12-18 ENCOUNTER — Other Ambulatory Visit: Payer: Self-pay | Admitting: Pharmacist

## 2016-12-18 ENCOUNTER — Other Ambulatory Visit: Payer: Self-pay | Admitting: Internal Medicine

## 2016-12-18 NOTE — Patient Outreach (Signed)
[facility]) Care Management  12/18/2016  [name] 12-17-1940 [id number]   Telephonic Transition of Care Services and Monthly Assessment   Referral:  11/21/16  Source:  [facility] Utilization management Department Issue:  SNF discharge 11/19/16.     H/o Patient with wounds; 2 pressure and 1 venous. Concerns regarding support from wife for patient and their needs.   H/o patient discharged from [facility] 11/18/16 Telephonic Screening, Transition of Care and Initial Assessment 11/22/16 Telephonic RN CM Services:  11/22/16 Transition of Care Services (Short Term):  11/22/16 Program:  Other:  Infection Prevention. 11/22/16  [address]  [place] [address] [phone] (H) Wife/caregiver:  [name]  Subjective: Outreach call #3.  Patient / caregiver, [name]  reached.  [name] [place] [address] [phone] (M)  Providers: Primary MD:  Dr. Nicoletta Ba  -  last appt: 12/06/16 Cardiologist: Dr. Orpah Cobb Orthopedics: Dr. Ranee Gosselin  Ophthalmology:  Dr. Sallye Lat General Surgery:  Dr. Edwin Cap [facility]:  Initial appt scheduled for 12/30/16 HH:  [facility] (11/21/2016 - )  RN, PT  Wound stage 3 dressing 3 times / week.  HH Nurse Contact:  Joni Reining [phone] Insurance:  HTA and Medicaid  Psycho/Social: Patient lives in his home with wife/caregiver.  Wife works 3 days a week.  Mobility: ambulates with walker in the house and W/C as needed.  Falls: 1 - last fall date 09/2016 Pain: none Depression: none Safety: none Transportation: wife  Caregiver: wife.  Wife works 3 days a week.  Emergency Contact: wife Wife:  [name], [phone] cell Son:  [name]  [phone] cell Advance Directive: Wife states completed during [facility] Admission and she has a copy. States she is indicated as Product manager.   Consent:  Patient provides verbal consent to complete calls and discuss  patients PHI with wife, [name].  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  A-Fib (not on anticoagulation due to prior history of GI Bleed and non-adherence to medications.  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  Discharged to SNF for OT/PT.   Diabetes A1C 7.400 11/25/2016 Glucose Random 285.000 12/06/2016 Wife checks patient's BS twice daily - running 100 - 145.  BP 100/68 12/06/2016 Weight 127 lb (57 kg) 12/06/2016 Height 64 in (161 cm) 12/06/2016 BMI 22.10 (Normal) 12/06/2016  Wound Stage 3 ankle / Infection Prevention Wife states wound is about the same.  Wound Clinic appt scheduled for 12/30/16.  HH continues to come and manage wound care.  Denies any fever, odor, redness.  Currently on no antibiotics.    Medications:  Patient taking less than 15 medications. Co-pay cost issues: Dionisio Paschal - h/o prior authorization completed but still expensive per [facility] pharmacy note on 12/09/16. [facility] Pharmacist, [name] attempting to reach patient/caregiver to discuss).  Pharmacy:  [facility] at [place]  Encounter Medications:  Outpatient Encounter Prescriptions as of 12/18/2016  Medication Sig Note  . calcium carbonate (OSCAL) 1500 (600 Ca) MG TABS tablet Take 600 mg of elemental calcium by mouth daily.    . carvedilol (COREG) 3.125 MG  tablet Take 1 tablet (3.125 mg total) by mouth 2 (two) times daily with a meal.   . cholecalciferol (VITAMIN D) 1000 units tablet Take 1,000 Units by mouth daily.   . famotidine (PEPCID) 20 MG tablet Take 20 mg by mouth 2 (two) times daily.   .  furosemide (LASIX) 20 MG tablet Take 1 tablet (20 mg total) by mouth 2 (two) times daily.   . insulin NPH-regular Human (NOVOLIN 70/30) (70-30) 100 UNIT/ML injection 9 U SQ with BF and 6 U SQ with Supper   . LACTULOSE PO Take 45 mLs by mouth 2 (two) times daily. Then give 30 ml at bedtime.   . magnesium oxide (MAG-OX) 400 (241.3 Mg) MG tablet Take 400 mg by mouth daily.    . metolazone (ZAROXOLYN) 5 MG tablet Take 5 mg by mouth 3 (three) times a week.   . ondansetron (ZOFRAN) 4 MG tablet Take 4 mg by mouth every 8 (eight) hours as needed for nausea.   . potassium chloride SA (K-DUR,KLOR-CON) 20 MEQ tablet Take 20 mEq by mouth 3 (three) times daily.    Marland Kitchen spironolactone (ALDACTONE) 25 MG tablet Take 25 mg by mouth daily.    . vitamin B-12 (CYANOCOBALAMIN) 1000 MCG tablet Take 1,000 mcg by mouth daily.   . vitamin C (ASCORBIC ACID) 500 MG tablet Take 500 mg by mouth daily.   . rifaximin (XIFAXAN) 550 MG TABS tablet Take 1 tablet (550 mg total) by mouth 2 (two) times daily. (Patient not taking: Reported on 12/06/2016) 12/06/2016: Hasn't picked up from pharmacy was waiting on PA---hks   No facility-administered encounter medications on file as of 12/18/2016.     Functional Status:  In your present state of health, do you have any difficulty performing the following activities: 11/22/2016 10/07/2016  Hearing? N N  Vision? N N  Difficulty concentrating or making decisions? N N  Walking or climbing stairs? Y Y  Dressing or bathing? Y Y  Doing errands, shopping? [name]  Preparing Food and eating ? N -  Using the Toilet? N -  In the past six months, have you accidently leaked urine? N -  Do you have problems with loss of bowel control? N -  Managing your Medications? N -  Managing your Finances? N -  Housekeeping or managing your Housekeeping? N -  Some recent data might be hidden    Fall/Depression Screening: PHQ 2/9 Scores 11/22/2016 09/13/2016 09/06/2016 09/04/2016 07/11/2016 04/27/2015 03/28/2015  PHQ  - 2 Score 0 0 0 0 0 0 0   Falls: 1 - last fall date 09/2016 Fall Risk  12/18/2016 11/22/2016 09/13/2016 09/06/2016 09/04/2016  Falls in the past year? Yes Yes Yes Yes Yes  Number falls in past yr: 1 1 1 1 1   Injury with Fall? Yes Yes Yes Yes Yes  Risk Factor Category  High Fall Risk High Fall Risk - - -  Risk for fall due to : History of fall(s);Impaired balance/gait;Impaired mobility History of fall(s);Impaired balance/gait;Impaired mobility - - -  Follow up Falls evaluation completed;Education provided;Falls prevention discussed Falls evaluation completed;Education provided;Falls prevention discussed - - -    Preventives: Hearing: no issues  Eyes:  DM Eye Exam: negative retin 07/29/2016 - Ophthalmology:  Dr. Sallye Lat Dentist:  None Podiatrist: DM Foot Exam 07/11/2016  : Colonoscopy N/D Former smoker:  Quit 47 years ago.   Assessment / Plan:  Referral:  11/21/16  Source:  [facility] Utilization management Department Issue:  SNF discharge 11/19/16.  H/o  Verdie DrownMary Niemczura, [facility]  unable to reach wife and left a brochure with patient in the facility.    H/o Patient with wounds; 2 pressure and 1 venous. Concerns regarding support from wife for patient and their needs.   H/o [facility] 11/18/16  Telephonic Screening, Transition of Care and Initial Assessment 11/22/16 Telephonic RN CM Services:  11/22/16 Transition of Care:  11/22/16 - 12/18/16 Program:   Infection Prevention.  11/22/16  -Disease management and education: infection prevention, wound care -Fall prevention: (last fall date 09/2016).   -HH services remain active. -Advance Directives:  Copy requested. None noted to be on file.   [facility] (11/22/2016) -Medication cost;  Dionisio PaschalXafaxan -  RN CM advised in next [facility] scheduled contact call within one month for Monthly Assessment and / or  care coordination services as needed.  RN CM advised to please notify MD of any changes in condition prior to  scheduled appt's:  Fever, redness, ordor, pain, worsening appearance of wound.   RN CM provided contact name and # [phone] or main office # [phone] and 24-hour nurse line # [phone].  RN CM confirmed patient is aware of 911 services for urgent emergency needs.  Simmie Daviesrystal Shenee Wignall, MSHL, BSN, RN, CCM  [facility] Care Management Care Management Coordinator [phone] Direct [phone] Cell [phone] Office [phone] Fax [email or web address]

## 2016-12-18 NOTE — Patient Outreach (Signed)
[facility]) Care Management  12/18/2016  [name] 1940/11/01 [id number]  Follow-up phone call to patient's spouse/caregiver, HIPAA details verified.  Spouse reports they have not received patient assistance application for [name] yet.  Spouse reports she may not wish to disclose proof of her financial status.    Counseled spouse as patient is married, patient assistance program may request proof of total household income, and if they elect to not provide this information to patient assistance program, patient assistance program may deny application.    Spouse reports she will complete application with patient when it arrives and provide patient's income, and return application.  Advised spouse prescriber has completed and returned prescriber portion of application.   Plan:  Will place follow-up call in the next 2-3 weeks regarding status of patient assistance application.    Tommye StandardKevin Samiha Denapoli, PharmD, [facility]ACP Clinical Pharmacist [facility] [phone]

## 2016-12-19 ENCOUNTER — Telehealth: Payer: Self-pay | Admitting: *Deleted

## 2016-12-19 NOTE — Telephone Encounter (Signed)
[name] [facility] on 12/19/16 at 9:55am stating that they would like a verbal order for social worker for pt. Per Dr. Milinda CaveMcGowen okay to get social worker. Left detailed message on [name]'s phone.

## 2016-12-20 ENCOUNTER — Ambulatory Visit: Payer: Self-pay

## 2016-12-21 DIAGNOSIS — K761 Chronic passive congestion of liver: Secondary | ICD-10-CM | POA: Diagnosis not present

## 2016-12-21 DIAGNOSIS — E1122 Type 2 diabetes mellitus with diabetic chronic kidney disease: Secondary | ICD-10-CM | POA: Diagnosis not present

## 2016-12-21 DIAGNOSIS — I110 Hypertensive heart disease with heart failure: Secondary | ICD-10-CM | POA: Diagnosis not present

## 2016-12-21 DIAGNOSIS — I5022 Chronic systolic (congestive) heart failure: Secondary | ICD-10-CM | POA: Diagnosis not present

## 2016-12-21 DIAGNOSIS — I2729 Other secondary pulmonary hypertension: Secondary | ICD-10-CM | POA: Diagnosis not present

## 2016-12-21 DIAGNOSIS — I5082 Biventricular heart failure: Secondary | ICD-10-CM | POA: Diagnosis not present

## 2016-12-21 DIAGNOSIS — I081 Rheumatic disorders of both mitral and tricuspid valves: Secondary | ICD-10-CM | POA: Diagnosis not present

## 2016-12-21 DIAGNOSIS — I480 Paroxysmal atrial fibrillation: Secondary | ICD-10-CM | POA: Diagnosis not present

## 2016-12-21 DIAGNOSIS — E113599 Type 2 diabetes mellitus with proliferative diabetic retinopathy without macular edema, unspecified eye: Secondary | ICD-10-CM | POA: Diagnosis not present

## 2016-12-21 DIAGNOSIS — N183 Chronic kidney disease, stage 3 (moderate): Secondary | ICD-10-CM | POA: Diagnosis not present

## 2016-12-21 DIAGNOSIS — G14 Postpolio syndrome: Secondary | ICD-10-CM | POA: Diagnosis not present

## 2016-12-21 DIAGNOSIS — K7290 Hepatic failure, unspecified without coma: Secondary | ICD-10-CM | POA: Diagnosis not present

## 2016-12-21 DIAGNOSIS — I2510 Atherosclerotic heart disease of native coronary artery without angina pectoris: Secondary | ICD-10-CM | POA: Diagnosis not present

## 2016-12-23 DIAGNOSIS — K761 Chronic passive congestion of liver: Secondary | ICD-10-CM | POA: Diagnosis not present

## 2016-12-23 DIAGNOSIS — E113599 Type 2 diabetes mellitus with proliferative diabetic retinopathy without macular edema, unspecified eye: Secondary | ICD-10-CM | POA: Diagnosis not present

## 2016-12-23 DIAGNOSIS — G14 Postpolio syndrome: Secondary | ICD-10-CM | POA: Diagnosis not present

## 2016-12-23 DIAGNOSIS — N183 Chronic kidney disease, stage 3 (moderate): Secondary | ICD-10-CM | POA: Diagnosis not present

## 2016-12-23 DIAGNOSIS — I081 Rheumatic disorders of both mitral and tricuspid valves: Secondary | ICD-10-CM | POA: Diagnosis not present

## 2016-12-23 DIAGNOSIS — I2510 Atherosclerotic heart disease of native coronary artery without angina pectoris: Secondary | ICD-10-CM | POA: Diagnosis not present

## 2016-12-23 DIAGNOSIS — I2729 Other secondary pulmonary hypertension: Secondary | ICD-10-CM | POA: Diagnosis not present

## 2016-12-23 DIAGNOSIS — I5022 Chronic systolic (congestive) heart failure: Secondary | ICD-10-CM | POA: Diagnosis not present

## 2016-12-23 DIAGNOSIS — I110 Hypertensive heart disease with heart failure: Secondary | ICD-10-CM | POA: Diagnosis not present

## 2016-12-23 DIAGNOSIS — K7290 Hepatic failure, unspecified without coma: Secondary | ICD-10-CM | POA: Diagnosis not present

## 2016-12-23 DIAGNOSIS — E1122 Type 2 diabetes mellitus with diabetic chronic kidney disease: Secondary | ICD-10-CM | POA: Diagnosis not present

## 2016-12-23 DIAGNOSIS — I480 Paroxysmal atrial fibrillation: Secondary | ICD-10-CM | POA: Diagnosis not present

## 2016-12-23 DIAGNOSIS — I5082 Biventricular heart failure: Secondary | ICD-10-CM | POA: Diagnosis not present

## 2016-12-30 ENCOUNTER — Encounter (HOSPITAL_BASED_OUTPATIENT_CLINIC_OR_DEPARTMENT_OTHER): Payer: PPO

## 2016-12-30 DIAGNOSIS — I110 Hypertensive heart disease with heart failure: Secondary | ICD-10-CM | POA: Diagnosis not present

## 2016-12-30 DIAGNOSIS — I081 Rheumatic disorders of both mitral and tricuspid valves: Secondary | ICD-10-CM | POA: Diagnosis not present

## 2016-12-30 DIAGNOSIS — I5022 Chronic systolic (congestive) heart failure: Secondary | ICD-10-CM | POA: Diagnosis not present

## 2016-12-30 DIAGNOSIS — I480 Paroxysmal atrial fibrillation: Secondary | ICD-10-CM | POA: Diagnosis not present

## 2016-12-30 DIAGNOSIS — K7290 Hepatic failure, unspecified without coma: Secondary | ICD-10-CM | POA: Diagnosis not present

## 2016-12-30 DIAGNOSIS — K761 Chronic passive congestion of liver: Secondary | ICD-10-CM | POA: Diagnosis not present

## 2016-12-30 DIAGNOSIS — N183 Chronic kidney disease, stage 3 (moderate): Secondary | ICD-10-CM | POA: Diagnosis not present

## 2016-12-30 DIAGNOSIS — I2510 Atherosclerotic heart disease of native coronary artery without angina pectoris: Secondary | ICD-10-CM | POA: Diagnosis not present

## 2016-12-30 DIAGNOSIS — I5082 Biventricular heart failure: Secondary | ICD-10-CM | POA: Diagnosis not present

## 2016-12-30 DIAGNOSIS — E113599 Type 2 diabetes mellitus with proliferative diabetic retinopathy without macular edema, unspecified eye: Secondary | ICD-10-CM | POA: Diagnosis not present

## 2016-12-30 DIAGNOSIS — I2729 Other secondary pulmonary hypertension: Secondary | ICD-10-CM | POA: Diagnosis not present

## 2016-12-30 DIAGNOSIS — E1122 Type 2 diabetes mellitus with diabetic chronic kidney disease: Secondary | ICD-10-CM | POA: Diagnosis not present

## 2016-12-30 DIAGNOSIS — G14 Postpolio syndrome: Secondary | ICD-10-CM | POA: Diagnosis not present

## 2016-12-31 ENCOUNTER — Ambulatory Visit (HOSPITAL_COMMUNITY)
Admission: RE | Admit: 2016-12-31 | Discharge: 2016-12-31 | Disposition: A | Discharge status: Home / Self Care | Payer: PPO | Source: Ambulatory Visit | Attending: Surgery | Admitting: Surgery

## 2016-12-31 ENCOUNTER — Encounter (HOSPITAL_BASED_OUTPATIENT_CLINIC_OR_DEPARTMENT_OTHER): Payer: PPO | Attending: Internal Medicine

## 2016-12-31 ENCOUNTER — Other Ambulatory Visit: Payer: Self-pay | Admitting: Surgery

## 2016-12-31 DIAGNOSIS — L97309 Non-pressure chronic ulcer of unspecified ankle with unspecified severity: Secondary | ICD-10-CM

## 2016-12-31 DIAGNOSIS — E11622 Type 2 diabetes mellitus with other skin ulcer: Secondary | ICD-10-CM | POA: Diagnosis not present

## 2016-12-31 DIAGNOSIS — M8580 Other specified disorders of bone density and structure, unspecified site: Secondary | ICD-10-CM | POA: Insufficient documentation

## 2016-12-31 DIAGNOSIS — I70208 Unspecified atherosclerosis of native arteries of extremities, other extremity: Secondary | ICD-10-CM | POA: Diagnosis not present

## 2016-12-31 DIAGNOSIS — Z951 Presence of aortocoronary bypass graft: Secondary | ICD-10-CM | POA: Insufficient documentation

## 2016-12-31 DIAGNOSIS — I2510 Atherosclerotic heart disease of native coronary artery without angina pectoris: Secondary | ICD-10-CM | POA: Diagnosis not present

## 2016-12-31 DIAGNOSIS — K7031 Alcoholic cirrhosis of liver with ascites: Secondary | ICD-10-CM | POA: Diagnosis not present

## 2016-12-31 DIAGNOSIS — L89513 Pressure ulcer of right ankle, stage 3: Secondary | ICD-10-CM | POA: Diagnosis not present

## 2016-12-31 DIAGNOSIS — E119 Type 2 diabetes mellitus without complications: Secondary | ICD-10-CM | POA: Insufficient documentation

## 2016-12-31 DIAGNOSIS — X58XXXA Exposure to other specified factors, initial encounter: Secondary | ICD-10-CM | POA: Insufficient documentation

## 2016-12-31 DIAGNOSIS — S21202A Unspecified open wound of left back wall of thorax without penetration into thoracic cavity, initial encounter: Secondary | ICD-10-CM | POA: Diagnosis not present

## 2016-12-31 DIAGNOSIS — Z794 Long term (current) use of insulin: Secondary | ICD-10-CM | POA: Diagnosis not present

## 2016-12-31 DIAGNOSIS — I4891 Unspecified atrial fibrillation: Secondary | ICD-10-CM | POA: Insufficient documentation

## 2016-12-31 DIAGNOSIS — K7210 Chronic hepatic failure without coma: Secondary | ICD-10-CM | POA: Diagnosis not present

## 2016-12-31 DIAGNOSIS — L98491 Non-pressure chronic ulcer of skin of other sites limited to breakdown of skin: Secondary | ICD-10-CM | POA: Diagnosis not present

## 2016-12-31 DIAGNOSIS — Z79899 Other long term (current) drug therapy: Secondary | ICD-10-CM | POA: Insufficient documentation

## 2016-12-31 DIAGNOSIS — S91001A Unspecified open wound, right ankle, initial encounter: Secondary | ICD-10-CM | POA: Diagnosis not present

## 2016-12-31 DIAGNOSIS — I872 Venous insufficiency (chronic) (peripheral): Secondary | ICD-10-CM | POA: Diagnosis not present

## 2016-12-31 DIAGNOSIS — L97312 Non-pressure chronic ulcer of right ankle with fat layer exposed: Secondary | ICD-10-CM | POA: Diagnosis not present

## 2017-01-02 ENCOUNTER — Other Ambulatory Visit: Payer: Self-pay | Admitting: Surgery

## 2017-01-02 DIAGNOSIS — L98499 Non-pressure chronic ulcer of skin of other sites with unspecified severity: Secondary | ICD-10-CM

## 2017-01-03 ENCOUNTER — Other Ambulatory Visit: Payer: Self-pay | Admitting: Pharmacist

## 2017-01-03 MED FILL — SANTYL OINTMENT: 250 | 20 days supply | Qty: 30 | Fill #0

## 2017-01-03 NOTE — Patient Outreach (Signed)
[facility]) Care Management  01/03/2017  Francisco RidgeRaymond Mueller [birth year] [id number]  Unsuccessful phone outreach to patient to follow-up if [employer] Patient Assistance Application was received yet.    No answer, unable to leave message as phone recording stated "voicemail box not set up."  Plan:  Will make another phone outreach attempt next week.   Tommye StandardKevin Trinda Harlacher, PharmD, [facility]ACP Clinical Pharmacist [facility] [phone]

## 2017-01-06 ENCOUNTER — Ambulatory Visit (INDEPENDENT_AMBULATORY_CARE_PROVIDER_SITE_OTHER): Payer: PPO | Admitting: Family Medicine

## 2017-01-06 ENCOUNTER — Telehealth: Payer: Self-pay

## 2017-01-06 ENCOUNTER — Encounter: Payer: Self-pay | Admitting: Family Medicine

## 2017-01-06 ENCOUNTER — Encounter: Payer: Self-pay | Admitting: Physician Assistant

## 2017-01-06 VITALS — BP 155/80 | HR 69 | Temp 97.5°F | Resp 16 | Ht 63.5 in | Wt 127.5 lb

## 2017-01-06 DIAGNOSIS — K7290 Hepatic failure, unspecified without coma: Secondary | ICD-10-CM | POA: Diagnosis not present

## 2017-01-06 DIAGNOSIS — I482 Chronic atrial fibrillation, unspecified: Secondary | ICD-10-CM

## 2017-01-06 DIAGNOSIS — E1122 Type 2 diabetes mellitus with diabetic chronic kidney disease: Secondary | ICD-10-CM | POA: Diagnosis not present

## 2017-01-06 DIAGNOSIS — K7460 Unspecified cirrhosis of liver: Secondary | ICD-10-CM | POA: Diagnosis not present

## 2017-01-06 DIAGNOSIS — I2510 Atherosclerotic heart disease of native coronary artery without angina pectoris: Secondary | ICD-10-CM | POA: Diagnosis not present

## 2017-01-06 DIAGNOSIS — K7682 Hepatic encephalopathy: Secondary | ICD-10-CM

## 2017-01-06 DIAGNOSIS — I5082 Biventricular heart failure: Secondary | ICD-10-CM

## 2017-01-06 DIAGNOSIS — E113599 Type 2 diabetes mellitus with proliferative diabetic retinopathy without macular edema, unspecified eye: Secondary | ICD-10-CM | POA: Diagnosis not present

## 2017-01-06 DIAGNOSIS — I2729 Other secondary pulmonary hypertension: Secondary | ICD-10-CM | POA: Diagnosis not present

## 2017-01-06 DIAGNOSIS — N183 Chronic kidney disease, stage 3 unspecified: Secondary | ICD-10-CM

## 2017-01-06 DIAGNOSIS — K761 Chronic passive congestion of liver: Secondary | ICD-10-CM | POA: Diagnosis not present

## 2017-01-06 DIAGNOSIS — I480 Paroxysmal atrial fibrillation: Secondary | ICD-10-CM | POA: Diagnosis not present

## 2017-01-06 DIAGNOSIS — I110 Hypertensive heart disease with heart failure: Secondary | ICD-10-CM | POA: Diagnosis not present

## 2017-01-06 DIAGNOSIS — I5022 Chronic systolic (congestive) heart failure: Secondary | ICD-10-CM | POA: Diagnosis not present

## 2017-01-06 DIAGNOSIS — G14 Postpolio syndrome: Secondary | ICD-10-CM | POA: Diagnosis not present

## 2017-01-06 DIAGNOSIS — I081 Rheumatic disorders of both mitral and tricuspid valves: Secondary | ICD-10-CM | POA: Diagnosis not present

## 2017-01-06 DIAGNOSIS — R188 Other ascites: Principal | ICD-10-CM

## 2017-01-06 NOTE — Telephone Encounter (Signed)
OK, can you call them and give a verbal order for this for me?

## 2017-01-06 NOTE — Progress Notes (Signed)
Pre visit review using our clinic review tool, if applicable. No additional management support is needed unless otherwise documented below in the visit note. 

## 2017-01-06 NOTE — Telephone Encounter (Signed)
Verbal order given  

## 2017-01-06 NOTE — Progress Notes (Signed)
OFFICE VISIT  01/06/2017   CC:  Chief Complaint  Patient presents with  . Follow-up    [facility],     HPI:    Patient is a 76 y.o. Caucasian male who presents accompanied by his wife for 4 week f/u cirrhosis of liver with ascites, also hx of hepatic encephalopathy. He has chronic systolic HF, prominent RV component, as well as CRI stage III. He currently has a cardiologist but does not have a gastroenterologist.  Eating and drinking well per pt. No problems voiding, no constipation.  Has 2-3 BMs per day, some formed and some loose. He has not been able to afford xifaxan.  Takes lactulose bid -tid. Walks about 30 feet per day with walker.  Still getting PT/OT at this time--may stop next week. Not making any improvement.  Wife says he sleeps a lot.   Says breathing feels fine. Home bp's 110 systolic consistently.  Complains of some vague diffuse abd pain some nights, but not during daytime. She says he still has many periods in which he seems disoriented/confused.  No fevers/chills.  No CP.   Past Medical History:  Diagnosis Date  . Atrial fibrillation (HCC)    ?GI bleed in past, plus pt tends to "self adjust/manage" his meds w/out regard for MD's guidance, so he has NOT been on anticoagulant for this.  . CHF (congestive heart failure) (HCC)   . Chronic bronchitis (HCC)    "used to get it often; haven't had it for awhile now" (04/12/2013)  . Chronic renal insufficiency, stage III (moderate)    GFR 40s  . Chronic systolic heart failure (HCC)   . 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  Hospitalized with hepatic encephalopathy 09/2016.  Marland Kitchen. Coronary artery disease   . Gout   . Heart murmur   . High cholesterol   . Hypertension   . Pneumonia   . Positive TB test    "but I don't have it"  . Postpolio syndrome 1951   "affected the right side of my body; been weak on that side ever since" (04/12/2013)  .  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 Dione BoozeGroat)  . Pulmonary embolism (HCC) 1980's or 1990's  . Severe mitral regurgitation 2013  . Severe tricuspid regurgitation 2013  . Subclinical hypothyroidism   . Type II diabetes mellitus (HCC)   . Umbilical hernia 02/2015   Symptomatic--Dr. Corliss Skainssuei to repair if cleared by cardiologist    Past Surgical History:  Procedure Laterality Date  . APPENDECTOMY  ~ 1962  . CARDIAC CATHETERIZATION     "I've had a few since 1989" (04/12/2013)  . CARDIAC CATHETERIZATION  12/2012   Severe native CAD, patent bypass grafts x 3, EF 25-30%--med mgmt  . CATARACT EXTRACTION W/ INTRAOCULAR LENS  IMPLANT, BILATERAL Bilateral 2005  . CERVICAL DISCECTOMY  1984   C6-7  . CORONARY ARTERY BYPASS GRAFT  01/1988; 05/1999   CABG X 3; CABG X 2  . EXPLORATORY LAPAROTOMY  ~ 1962   "took out my appendix while there; bowel was inflammed" (04/12/2013)  . HYDROCELE EXCISION / REPAIR     "couple times" (04/12/2013)  . LEFT AND RIGHT HEART CATHETERIZATION WITH CORONARY/GRAFT ANGIOGRAM  01/13/2013   Procedure: LEFT AND RIGHT HEART CATHETERIZATION WITH Isabel CapriceORONARY/GRAFT ANGIOGRAM;  Surgeon: Ricki RodriguezAjay S [name], MD;  Location: [facility] CATH LAB;  Service: Cardiovascular;;  . PARACENTESIS  2013-2014  Multiple times, usually pulling off 6-7 liters of fluid  . RETINAL LASER PROCEDURE Bilateral    "more than once on the left" (04/12/2013)  . TRANSTHORACIC ECHOCARDIOGRAM  2013;12/2014;01/2016   LV dilation, EF 30-35%, diffuse hypokinesis, grade I diast dysfxn, severe mitral and tricuspid regurg, LAE and RAE--biventricular dysfunction moderate/severe. 01/2016- EF 20-25%, dilated LV with akinesis/hypokinesis diffusely, severe RV dysfxn, severe mitral regurg, severe PA HTN    Outpatient Medications Prior to Visit  Medication Sig Dispense Refill  . calcium carbonate (OSCAL) 1500 (600 Ca) MG TABS tablet Take 600 mg of elemental calcium by mouth daily.     . carvedilol  (COREG) 3.125 MG tablet Take 1 tablet (3.125 mg total) by mouth 2 (two) times daily with a meal.    . cholecalciferol (VITAMIN D) 1000 units tablet Take 1,000 Units by mouth daily.    . famotidine (PEPCID) 20 MG tablet Take 20 mg by mouth 2 (two) times daily.    . furosemide (LASIX) 20 MG tablet Take 1 tablet (20 mg total) by mouth 2 (two) times daily. 30 tablet 0  . insulin NPH-regular Human (NOVOLIN 70/30) (70-30) 100 UNIT/ML injection 9 U SQ with BF and 6 U SQ with Supper 10 mL 11  . LACTULOSE PO Take 45 mLs by mouth 2 (two) times daily. Then give 30 ml at bedtime.    . magnesium oxide (MAG-OX) 400 (241.3 Mg) MG tablet Take 400 mg by mouth daily.     . metolazone (ZAROXOLYN) 5 MG tablet Take 5 mg by mouth 3 (three) times a week.    . ondansetron (ZOFRAN) 4 MG tablet Take 4 mg by mouth every 8 (eight) hours as needed for nausea.    . potassium chloride SA (K-DUR,KLOR-CON) 20 MEQ tablet Take 20 mEq by mouth 3 (three) times daily.     Marland Kitchen spironolactone (ALDACTONE) 25 MG tablet Take 25 mg by mouth daily.     . vitamin B-12 (CYANOCOBALAMIN) 1000 MCG tablet Take 1,000 mcg by mouth daily.    . vitamin C (ASCORBIC ACID) 500 MG tablet Take 500 mg by mouth daily.    . rifaximin (XIFAXAN) 550 MG TABS tablet Take 1 tablet (550 mg total) by mouth 2 (two) times daily. (Patient not taking: Reported on 01/06/2017) 60 tablet 3   No facility-administered medications prior to visit.     Allergies  Allergen Reactions  . Adhesive [Tape] Other (See Comments)    Reaction:  Pulls pts skin off   . Sulfa Antibiotics Rash    ROS As per HPI  PE: Blood pressure (!) 155/80, pulse 69, temperature 97.5 F (36.4 C), temperature source Oral, resp. rate 16, height 5' 3.5" (1.613 m), weight 127 lb 8 oz (57.8 kg), SpO2 98 %. Alert, oriented to "your office", person, situation.  He cannot recall my name. WUJ:WJXB: no injection, icteris, swelling, or exudate.  EOMI, PERRLA. Mouth: lips without lesion/swelling.  Oral  mucosa pink and moist. Oropharynx without erythema, exudate, or swelling.  CV: irreg irreg, rate about 70, 2/6 systolic murmur.  No diastolic murmur. Chest with symmetric expansion, good aeration, nonlabored respirations.  Clear and equal breath sounds in all lung fields.  No clubbing or cyanosis. EXT: no clubbing, cyanosis, or edema.   LABS:  Lab Results  Component Value Date   TSH 14.29 (H) 11/25/2016  Free T4 and T3 total both normal on 11/26/16.  Lab Results  Component Value Date   WBC 5.0 11/25/2016   HGB 14.3 11/25/2016   HCT  42.7 11/25/2016   MCV 92.7 11/25/2016   PLT 119.0 (L) 11/25/2016   Lab Results  Component Value Date   CREATININE 1.49 12/06/2016   BUN 79 (H) 12/06/2016   NA 125 (L) 12/06/2016   K 3.8 12/06/2016   CL 88 (L) 12/06/2016   CO2 28 12/06/2016   Lab Results  Component Value Date   ALT 22 12/06/2016   AST 36 12/06/2016   ALKPHOS 143 (H) 12/06/2016   BILITOT 2.1 (H) 12/06/2016   Lab Results  Component Value Date   CHOL 198 07/04/2014   Lab Results  Component Value Date   HDL 40.40 07/04/2014   Lab Results  Component Value Date   LDLCALC 139 (H) 07/04/2014   Lab Results  Component Value Date   TRIG 95.0 07/04/2014   Lab Results  Component Value Date   CHOLHDL 5 07/04/2014   Lab Results  Component Value Date   HGBA1C 7.4 (H) 11/25/2016   12/06/16 ammonia level was stable at 124.  IMPRESSION AND PLAN:  1) Cirrhosis of liver with history of ascites.  He seems to have chronic low grade hepatic encephalopathy.  Continue lactulose at current dosing.  Pt could not afford xifaxan.   No sign of volume overload presently. BPs normal at home.  Continue spironolactone. Check CMET and ammonia level today. Will order GI referral to see if they have anything to add regarding w/u or treatment plan. Discussed overall outlook with pt's wife today and she asked if it would be appropriate to get him into hospice and I said yes this is a reasonable  idea and I'll order this today.  2) Chronic biventricular heart failure: no sign of volume overload today. This is suspected to be the primary problem that has led to chronic hepatic congestion and subsequent gradual change to cirrhosis. No change in diuretics at this time.  Continue coreg 3.125mg  bid + spironolactone.  3) CRI stage III: GFR in the 40s.  Tenuous balance with CHF and diuretic use. Rechecking lytes/cr today.  4) A-fib: vent rate controlled.  Not anticoagulation candidate due to mental status impairment and history of noncompliance + significant fall risk.  An After Visit Summary was printed and given to the patient.  FOLLOW UP: Return in about 7 weeks (around 02/24/2017) for routine chronic illness f/u.  Signed:  Santiago Bumpers, MD           01/06/2017

## 2017-01-06 NOTE — Telephone Encounter (Signed)
Needs order for OT twice a week To extend x 2 weeks

## 2017-01-07 DIAGNOSIS — I872 Venous insufficiency (chronic) (peripheral): Secondary | ICD-10-CM | POA: Diagnosis not present

## 2017-01-07 DIAGNOSIS — L97312 Non-pressure chronic ulcer of right ankle with fat layer exposed: Secondary | ICD-10-CM | POA: Diagnosis not present

## 2017-01-07 DIAGNOSIS — S21202A Unspecified open wound of left back wall of thorax without penetration into thoracic cavity, initial encounter: Secondary | ICD-10-CM | POA: Diagnosis not present

## 2017-01-07 DIAGNOSIS — L89513 Pressure ulcer of right ankle, stage 3: Secondary | ICD-10-CM | POA: Diagnosis not present

## 2017-01-07 LAB — COMPREHENSIVE METABOLIC PANEL
ALT: 25 U/L (ref 0–53)
AST: 35 U/L (ref 0–37)
Albumin: 3.5 g/dL (ref 3.5–5.2)
Alkaline Phosphatase: 199 U/L — ABNORMAL HIGH (ref 39–117)
BUN: 83 mg/dL — AB (ref 6–23)
CALCIUM: 9.6 mg/dL (ref 8.4–10.5)
CHLORIDE: 94 meq/L — AB (ref 96–112)
CO2: 24 meq/L (ref 19–32)
CREATININE: 1.51 mg/dL — AB (ref 0.40–1.50)
GFR: 47.89 mL/min — ABNORMAL LOW (ref >60.00)
GLUCOSE: 269 mg/dL — AB (ref 70–99)
Potassium: 3.9 mEq/L (ref 3.5–5.1)
SODIUM: 129 meq/L — AB (ref 135–145)
Total Bilirubin: 1.4 mg/dL — ABNORMAL HIGH (ref 0.2–1.2)
Total Protein: 7.8 g/dL (ref 6.0–8.3)

## 2017-01-07 LAB — AMMONIA: Ammonia: 118 umol/L — ABNORMAL HIGH (ref <=47)

## 2017-01-08 DIAGNOSIS — K7290 Hepatic failure, unspecified without coma: Secondary | ICD-10-CM | POA: Diagnosis not present

## 2017-01-08 DIAGNOSIS — I5022 Chronic systolic (congestive) heart failure: Secondary | ICD-10-CM | POA: Diagnosis not present

## 2017-01-08 DIAGNOSIS — E1122 Type 2 diabetes mellitus with diabetic chronic kidney disease: Secondary | ICD-10-CM | POA: Diagnosis not present

## 2017-01-08 DIAGNOSIS — N183 Chronic kidney disease, stage 3 (moderate): Secondary | ICD-10-CM | POA: Diagnosis not present

## 2017-01-08 DIAGNOSIS — I2729 Other secondary pulmonary hypertension: Secondary | ICD-10-CM | POA: Diagnosis not present

## 2017-01-08 DIAGNOSIS — I5082 Biventricular heart failure: Secondary | ICD-10-CM | POA: Diagnosis not present

## 2017-01-08 DIAGNOSIS — I110 Hypertensive heart disease with heart failure: Secondary | ICD-10-CM | POA: Diagnosis not present

## 2017-01-08 DIAGNOSIS — G14 Postpolio syndrome: Secondary | ICD-10-CM | POA: Diagnosis not present

## 2017-01-08 DIAGNOSIS — K761 Chronic passive congestion of liver: Secondary | ICD-10-CM | POA: Diagnosis not present

## 2017-01-08 DIAGNOSIS — I081 Rheumatic disorders of both mitral and tricuspid valves: Secondary | ICD-10-CM | POA: Diagnosis not present

## 2017-01-08 DIAGNOSIS — I2510 Atherosclerotic heart disease of native coronary artery without angina pectoris: Secondary | ICD-10-CM | POA: Diagnosis not present

## 2017-01-08 DIAGNOSIS — E113599 Type 2 diabetes mellitus with proliferative diabetic retinopathy without macular edema, unspecified eye: Secondary | ICD-10-CM | POA: Diagnosis not present

## 2017-01-08 DIAGNOSIS — I480 Paroxysmal atrial fibrillation: Secondary | ICD-10-CM | POA: Diagnosis not present

## 2017-01-10 ENCOUNTER — Encounter: Payer: Self-pay | Admitting: Family Medicine

## 2017-01-13 ENCOUNTER — Telehealth: Payer: Self-pay | Admitting: Family Medicine

## 2017-01-13 ENCOUNTER — Other Ambulatory Visit: Payer: Self-pay | Admitting: Pharmacist

## 2017-01-13 ENCOUNTER — Encounter: Payer: Self-pay | Admitting: Physician Assistant

## 2017-01-13 ENCOUNTER — Ambulatory Visit (INDEPENDENT_AMBULATORY_CARE_PROVIDER_SITE_OTHER): Payer: PPO | Admitting: Physician Assistant

## 2017-01-13 VITALS — BP 122/56 | HR 64 | Ht 65.0 in | Wt 126.0 lb

## 2017-01-13 DIAGNOSIS — K7460 Unspecified cirrhosis of liver: Secondary | ICD-10-CM | POA: Diagnosis not present

## 2017-01-13 DIAGNOSIS — R188 Other ascites: Secondary | ICD-10-CM | POA: Diagnosis not present

## 2017-01-13 DIAGNOSIS — R110 Nausea: Secondary | ICD-10-CM | POA: Diagnosis not present

## 2017-01-13 DIAGNOSIS — I5022 Chronic systolic (congestive) heart failure: Secondary | ICD-10-CM | POA: Diagnosis not present

## 2017-01-13 DIAGNOSIS — E1165 Type 2 diabetes mellitus with hyperglycemia: Secondary | ICD-10-CM | POA: Diagnosis not present

## 2017-01-13 DIAGNOSIS — K7682 Hepatic encephalopathy: Secondary | ICD-10-CM

## 2017-01-13 DIAGNOSIS — R1084 Generalized abdominal pain: Secondary | ICD-10-CM

## 2017-01-13 DIAGNOSIS — E784 Other hyperlipidemia: Secondary | ICD-10-CM | POA: Diagnosis not present

## 2017-01-13 DIAGNOSIS — K7290 Hepatic failure, unspecified without coma: Secondary | ICD-10-CM

## 2017-01-13 MED ORDER — RIFAXIMIN 550 MG PO TABS: 550.0000 mg | ORAL_TABLET | Freq: Two times a day (BID) | ORAL | 5 refills | Status: DC

## 2017-01-13 MED ORDER — ONDANSETRON HCL 4 MG PO TABS: 4.0000 mg | ORAL_TABLET | Freq: Every day | ORAL | 1 refills | Status: AC

## 2017-01-13 NOTE — Progress Notes (Signed)
Reviewed. Agree that he is NOT appropriate for procedural work up. Symptomatic therapies as best we can in this patient on hospice

## 2017-01-13 NOTE — Progress Notes (Signed)
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 fib, CHF (last echo 02/14/16 with an LVEF of 20-25%), chronic renal insufficiency stage III, cirrhosis of the liver with ascites thought secondary to hepatic congestion and pulmonary hypertension, coronary artery disease and multiple others listed below, who was referred to me by [name], MD for a complaint of abdominal pain and cirrhosis.     Of note per chart review patient was recently placed on full home hospice care on 01/10/17.   Today, the patient presents to clinic accompanied by his wife who does provide most of his history. She explains that he was diagnosed with cirrhosis around 2013. In December 2017 he was admitted for his first episode of hepatic encephalopathy. At that time, he was started on lactulose 3 times daily. Patient's wife notes that this lactulose seems to give him some abdominal pain if it is taken without food. She also describes very loose bowel movements multiple times a day recently which were difficult for the patient as he is on a bedside commode and in a wheelchair typically. She has decreased this to twice daily. Patient's ammonia level remains elevated and she does describe that he is "quite confused" all the time.     Another complaint today is of abdominal pain which occurs "off and on", this is typically around the patient's bellybutton near the site of his umbilical hernia. Per his wife this has been evaluated in the past by a surgical team and they are not willing to put him under anesthesia to fix this due to his poor heart function and multiple comorbidities.   Patient's other complaint today is of "dry heaving"/nausea which occurs in the middle of the night per the patient's wife. She tells me he will be laying there asleep and wake up with a dry heaves and tell her that this is due to "belly  pain". She thinks this is worse if she gives him his lactulose without food at night. Zofran does help with this symptom. Xifaxan has been recommended in the past, but the patient and his family were unable to afford this.   Patient denies fever, chills, blood in his stool, melena, weight loss, fatigue, anorexia, vomiting, heartburn or reflux  Past Medical History:  Diagnosis Date  . Atrial fibrillation (HCC)    ?GI bleed in past, plus pt tends to "self adjust/manage" his meds w/out regard for MD's guidance, so he has NOT been on anticoagulant for this.  . CHF (congestive heart failure) (HCC)   . Chronic bronchitis (HCC)    "used to get it often; haven't had it for awhile now" (04/12/2013)  . Chronic renal insufficiency, stage III (moderate)    GFR 40s  . Chronic systolic heart failure (HCC)   . 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  Hospitalized with hepatic encephalopathy 09/2016.  Marland Kitchen Coronary artery disease   . Gout   . Heart murmur   . High cholesterol   . Hypertension   . Patient on full hospice care    admitted to home hospice care 01/10/17.  . Pneumonia   . Positive TB test    "but I don't have it"  . Postpolio syndrome 1951   "affected the right side of my body; been weak on that side ever since" (04/12/2013)  . 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 Dione BoozeGroat)  . Pulmonary embolism (HCC) 1980's or 1990's  . Severe mitral regurgitation 2013  . Severe tricuspid regurgitation 2013  . Subclinical hypothyroidism   . Type II diabetes mellitus (HCC)   . Umbilical hernia 02/2015   Symptomatic--Dr. Corliss Skainssuei to repair if cleared by cardiologist    Past Surgical History:  Procedure Laterality Date  . APPENDECTOMY  ~ 1962  . CARDIAC CATHETERIZATION     "I've had a few since 1989" (04/12/2013)  . CARDIAC CATHETERIZATION  12/2012   Severe native CAD, patent bypass  grafts x 3, EF 25-30%--med mgmt  . CATARACT EXTRACTION W/ INTRAOCULAR LENS  IMPLANT, BILATERAL Bilateral 2005  . CERVICAL DISCECTOMY  1984   C6-7  . CORONARY ARTERY BYPASS GRAFT  01/1988; 05/1999   CABG X 3; CABG X 2  . EXPLORATORY LAPAROTOMY  ~ 1962   "took out my appendix while there; bowel was inflammed" (04/12/2013)  . HYDROCELE EXCISION / REPAIR     "couple times" (04/12/2013)  . LEFT AND RIGHT HEART CATHETERIZATION WITH CORONARY/GRAFT ANGIOGRAM  01/13/2013   Procedure: LEFT AND RIGHT HEART CATHETERIZATION WITH Isabel CapriceORONARY/GRAFT ANGIOGRAM;  Surgeon: Ricki RodriguezAjay S [name], MD;  Location: [facility]ATH [facility];  Service: Cardiovascular;;  . PARACENTESIS  2013-2014   Multiple times, usually pulling off 6-7 liters of fluid  . RETINAL LASER PROCEDURE Bilateral    "more than once on the left" (04/12/2013)  . TRANSTHORACIC ECHOCARDIOGRAM  2013;12/2014;01/2016   LV dilation, EF 30-35%, diffuse hypokinesis, grade I diast dysfxn, severe mitral and tricuspid regurg, LAE and RAE--biventricular dysfunction moderate/severe. 01/2016- EF 20-25%, dilated LV with akinesis/hypokinesis diffusely, severe RV dysfxn, severe mitral regurg, severe PA HTN    Current Outpatient Prescriptions  Medication Sig Dispense Refill  . calcium carbonate (OSCAL) 1500 (600 Ca) MG TABS tablet Take 600 mg of elemental calcium by mouth daily.     . carvedilol (COREG) 3.125 MG tablet Take 1 tablet (3.125 mg total) by mouth 2 (two) times daily with a meal.    . cholecalciferol (VITAMIN D) 1000 units tablet Take 1,000 Units by mouth daily.    . famotidine (PEPCID) 20 MG tablet Take 20 mg by mouth 2 (two) times daily.    . furosemide (LASIX) 20 MG tablet Take 1 tablet (20 mg total) by mouth 2 (two) times daily. 30 tablet 0  . insulin NPH-regular Human (NOVOLIN 70/30) (70-30) 100 UNIT/ML injection 9 U SQ with BF and 6 U SQ with Supper 10 mL 11  . LACTULOSE PO Take 45 mLs by mouth 2 (two) times daily. Then give 30 ml at bedtime.    . magnesium oxide (MAG-OX)  400 (241.3 Mg) MG tablet Take 400 mg by mouth daily.     . metolazone (ZAROXOLYN) 5 MG tablet Take 5 mg by mouth 3 (three) times a week.    . ondansetron (ZOFRAN) 4 MG tablet Take 1 tablet (4 mg total) by mouth at bedtime. 20 tablet 1  . potassium chloride SA (K-DUR,KLOR-CON) 20 MEQ tablet Take 20 mEq by mouth 3 (three) times daily.     Marland Kitchen. spironolactone (ALDACTONE) 25 MG tablet Take 25 mg by mouth daily.     . vitamin B-12 (CYANOCOBALAMIN) 1000 MCG tablet Take 1,000 mcg by mouth daily.    . vitamin C (ASCORBIC ACID) 500 MG tablet Take 500 mg by mouth daily.    . rifaximin (XIFAXAN) 550 MG TABS tablet Take 1 tablet (550 mg total)  by mouth 2 (two) times daily. 60 tablet 5   No current facility-administered medications for this visit.     Allergies as of 01/13/2017 - Review Complete 01/13/2017  Allergen Reaction Noted  . Adhesive [tape] Other (See Comments) 02/10/2015  . Sulfa antibiotics Rash 10/31/2011    Family History  Problem Relation Age of Onset  . Breast cancer Sister   . Parkinson's disease Brother   . Colon cancer Neg Hx   . Stomach cancer Neg Hx     Social History   Social History  . Marital status: Married    Spouse name: N/A  . Number of children: 3  . Years of education: N/A   Occupational History  . former Music therapist    Social History Main Topics  . Smoking status: Former Smoker    Packs/day: 0.25    Years: 20.00    Types: Cigarettes    Quit date: 10/30/1973  . Smokeless tobacco: Former Neurosurgeon    Types: Chew    Quit date: 10/21/1969  . Alcohol use No     Comment: 04/12/2013 "have ~ 2 drinks/yr; a drink for me is ~ 0.5oz"  . Drug use: No  . Sexual activity: Not Currently   Other Topics Concern  . Not on file   Social History Narrative   Married (2nd marriage).  Has 3 living children, one child deceased.   Occupation: former Music therapist.  Disabled due to cardiovascular problems.   10-15 pack-yr hx tobacco, quit approx 1970s.     Some distant hx of alcohol  abuse/binging, but no alcohol since 2013.   NO drug use.          Review of Systems:    Constitutional: No weight loss, fever or chills Skin: No rash Cardiovascular: No chest pain Respiratory: No SOB  Gastrointestinal: See HPI and otherwise negative Genitourinary: No dysuria or change in urinary frequency Neurological: No headache Musculoskeletal: No new muscle or joint pain Hematologic: No bleeding  Psychiatric: No history of depression   Physical Exam:  Vital signs: BP (!) 122/56   Pulse 64   Ht 5\' 5"  (1.651 m) Comment: verbal  Wt 126 lb (57.2 kg)   BMI 20.97 kg/m   Constitutional:   Ill appearing Caucasian male appears to be in NAD, Well developed, Well nourished, alert and cooperative Head:  Normocephalic and atraumatic. Eyes:   PEERL, EOMI. No icterus. Conjunctiva pink. Ears:  Normal auditory acuity. Neck:  Supple Throat: Oral cavity and pharynx without inflammation, swelling or lesion.  Respiratory: Respirations even and unlabored. Lungs clear to auscultation bilaterally.   No wheezes, crackles, or rhonchi.  Cardiovascular: Normal S1, S2. Irregularly irregular, + heart murmur No peripheral edema, cyanosis or pallor.  Gastrointestinal:  Soft, nondistended, nontender. No rebound or guarding. Normal bowel sounds. No appreciable masses or hepatomegaly.umbilical hernia, easily reducible Rectal:  Not performed.  Msk:  Ambulating in wheelchair Symmetrical without gross deformities. Without edema, no deformity or joint abnormality.  Neurologic:  grossly normal neurologically.  Skin:   Dry and intact without significant lesions or rashes. Psychiatric: slow affect, oriented to person and place  MOST RECENT LABS AND IMAGING: CBC    Component Value Date/Time   WBC 5.0 11/25/2016 1529   RBC 4.60 11/25/2016 1529   HGB 14.3 11/25/2016 1529   HCT 42.7 11/25/2016 1529   PLT 119.0 (L) 11/25/2016 1529   MCV 92.7 11/25/2016 1529   MCV 90.4 09/04/2016 1449   MCH 31.1 10/16/2016  0423   MCHC 33.5  11/25/2016 1529   RDW 16.2 (H) 11/25/2016 1529   LYMPHSABS 0.6 (L) 11/25/2016 1529   MONOABS 0.6 11/25/2016 1529   EOSABS 0.2 11/25/2016 1529   BASOSABS 0.0 11/25/2016 1529    CMP     Component Value Date/Time   NA 129 (L) 01/06/2017 1531   NA 133 (A) 10/25/2016   K 3.9 01/06/2017 1531   CL 94 (L) 01/06/2017 1531   CO2 24 01/06/2017 1531   GLUCOSE 269 (H) 01/06/2017 1531   BUN 83 (H) 01/06/2017 1531   BUN 57 (A) 10/25/2016   CREATININE 1.51 (H) 01/06/2017 1531   CALCIUM 9.6 01/06/2017 1531   PROT 7.8 01/06/2017 1531   ALBUMIN 3.5 01/06/2017 1531   AST 35 01/06/2017 1531   ALT 25 01/06/2017 1531   ALKPHOS 199 (H) 01/06/2017 1531   BILITOT 1.4 (H) 01/06/2017 1531   GFRNONAA 43 (L) 10/17/2016 0409   GFRAA 50 (L) 10/17/2016 0409    7d ago (01/06/17) 577mo ago (12/06/16) 577mo ago (11/25/16) 77mo ago (10/16/16)     Ammonia < OR = 47 umol/L 118   124R   129R   36R    Comments: Verified by repeat analysis.    Assessment: 1. Cirrhosis of the liver complicated by ascites and hepatic encephalopathy: Thought due to pulmonary congestion from CHF, diagnosed 5 years ago per patient's wife, currently on lactulose but remains somewhat confused, patient only able to take this twice a day, Xifaxan too expensive in the past 2. Abdominal pain: Most likely related to umbilical hernia as this is where patient points to pain, possibly this is worse when stool is passing through this area, has been evaluated by surgical team in the past, patient is not a candidate for elective surgery 3. Nausea: Occurs at night and wakes up patient from sleep on occasion; consider relation to above versus medications  Plan: 1. Schedule Zofran 4 mg daily at bedtime 2. We will try to get Xifaxan approved 550 mg twice a day, possibly at lower cost with the patient and his family. I believe he would benefit greatly from this medication 3. Encouraged patient's wife to try and give the lactulose 3 times a  day for now 4. Discussed that patient is not a good candidate for anesthesia with his multiple comorbidities. He and his family are aware. Would not recommend elective screening procedures today including colonoscopy or EGD 5. Patient to follow in clinic with Dr. Marina Goodell or myself in 3-4 weeks  Hyacinth Meeker, PA-C [facility] 01/13/2017, 3:21 PM  Cc: McGowen, Maryjean Morn, MD

## 2017-01-13 NOTE — Patient Instructions (Signed)
We have sent your demographic information and a prescription for Xifaxan to [facility]. This pharmacy is able to get medication approved through insurance and get you the lowest copay possible. If you have not heard from them within 1 week, please call our office at [phone] to let us know.  We have sent the following medications to your pharmacy for you to pick up at your convenience:  Zofran 4 mg at bedtime

## 2017-01-13 NOTE — Patient Outreach (Signed)
[facility]) Care Management  01/13/2017  Francisco RidgeRaymond Mueller Sep 19, 1941 [id number]  Successful phone follow-up with patient's spouse.  Spouse reports she isn't sure if she received [employer] Patient Assistance Application for rifaximin.    Spouse asked what the likelihood of patient being approved is---explained it is difficult for [facility] Pharmac[facility] to determine as patient assistance program evaluates Part D beneficiaries on a case by case basis and documentation of household income will need to be included with application.  Reminded spouse patient assistance program is free to apply to.    Spouse reports at this time they will not pursue patient assistance application for rifaximin with [name].    Of note, per review of 01/06/17 office note from PCP, patient may be looking into hospice.    Plan:   Will close pharmacy case at this time as patient's spouse no longer wishes to apply for manufacturer assistance with rifaximin.    Spouse aware she can contact [facility] Pharmacist if they would like to pursue assistance later.    Will update PCP office.   Francisco StandardKevin [name], PharmD, [facility]ACP Clinical Pharmacist [facility] [phone]

## 2017-01-13 NOTE — Progress Notes (Signed)
Noted  

## 2017-01-13 NOTE — Telephone Encounter (Signed)
[facility] to speak to you about the prior authorization sent for patient. Please call Debbie back to advise.

## 2017-01-13 NOTE — Telephone Encounter (Signed)
I spoke with Debbie. She is cancelling the [employer] referral. She advised that Hospice will bill Medicare not [employer]

## 2017-01-14 ENCOUNTER — Inpatient Hospital Stay (HOSPITAL_COMMUNITY): Admission: RE | Admit: 2017-01-14 | Payer: PPO | Source: Ambulatory Visit

## 2017-01-15 ENCOUNTER — Other Ambulatory Visit: Payer: Self-pay

## 2017-01-15 NOTE — Patient Outreach (Signed)
[facility]) Care Management  01/15/2017  [name] [birth year] [id number]   Telephonic Monthly Assessment  Program:  Infection Prevention  [address]  [place] [address] [phone] (H) Wife/caregiver:  Francisco NoNancy Mueller  Subjective: Outreach call #1.  Patient / caregiver, [name]  reached.  [name].  [address]  [place] [address] [phone] (M)  Providers: Primary MD:  Dr. Nicoletta BaPhilip McGowen  -  last appt: 01/06/17 Cardiologist: Dr. Orpah CobbAjay [name] Orthopedics: Dr. Ranee Gosselinonald Gioffre  Ophthalmology:  Dr. Sallye Lathristopher Groat General Surgery:  Dr. Esperanza RichtersMatthew Tsuei [facility]:   [facility]:  Effective 01/09/17.   Insurance:  HTA and Medicaid  Psycho/Social: Patient lives in his home with Charity fundraiserwife/caregiver.  Wife works 3 days a week.  Mobility: ambulates with walker in the house and W/C as needed.  Falls: 1 - last fall date 09/2016 Pain: none Depression: none Safety: none Transportation: wife  Caregiver: wife.  Wife works 3 days a week.  Emergency Contact: wife Wife:  [name], [phone] cell Son:  [name]  [phone] cell Advance Directive: Wife states completed [facility] and she has a copy. States she is indicated as Product managerHCPOA.   Consent:  Patient provides verbal consent to complete calls and discuss patients PHI with wife, [name].  DME: walker,  W/C, glucometer and supplies, reading glasses   Co-morbidities:  Systolic CHF (last EF of 20-25% with severe pulmonary artery hypertension and tricuspid regurgitation.  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  A-Fib (not on anticoagulation due to prior history of GI Bleed and non-adherence to medications.  CKD stage 888, CAD, HTN, DM type 2, Gout, High cholesterol,  Admissions:  -10/17/16 - 11/18/16  [facility]  -10/07/16-10/17/16 [facility]acute metabolic encephalopathy secondary  to UTI and hepatic encephalopathy with ammonia level of 175.  Discharged to SNF for OT/PT.   Diabetes A1C 7.400 11/25/2016 Glucose Random 285.000 12/06/2016 Wife checks patient's BS twice daily - running 100 - 145.  BP 100/68 12/06/2016 Weight 127 lb (57 kg) 12/06/2016 Height 64 in (161 cm) 12/06/2016 BMI 22.10 (Normal) 12/06/2016  Wound Stage 3 ankle / Infection Prevention Wife states wound is about the same.  Wound Clinic appt scheduled for 12/30/16.  Previous HH services prior to Hospice.     Medications:  Patient taking less than 15 medications. Co-pay cost issues: Dionisio PaschalXafaxan - h/o prior authorization completed but still expensive per [facility] on 12/09/16. [facility] Pharmacist, Tommye StandardKevin Ruedinger attempting to reach patient/caregiver to discuss).  [name]  Encounter Medications:  Outpatient Encounter Prescriptions as of 01/15/2017  Medication Sig Note  . calcium carbonate (OSCAL) 1500 (600 Ca) MG TABS tablet Take 600 mg of elemental calcium by mouth daily.    . carvedilol (COREG) 3.125 MG tablet Take 1 tablet (3.125 mg total) by mouth 2 (two) times daily with a meal.   . cholecalciferol (VITAMIN D) 1000 units tablet Take 1,000 Units by mouth daily.   . famotidine (PEPCID) 20 MG tablet Take 20 mg by mouth 2 (two) times daily.   . furosemide (LASIX) 20 MG tablet Take 1 tablet (20 mg total) by mouth 2 (two) times daily. 01/06/2017: Taking 40mg  BID---hks  . insulin NPH-regular Human (NOVOLIN 70/30) (70-30) 100 UNIT/ML injection 9 U SQ with BF and 6 U SQ with Supper   . LACTULOSE PO Take 45 mLs by mouth 2 (two) times daily. Then give 30  ml at bedtime.   . magnesium oxide (MAG-OX) 400 (241.3 Mg) MG tablet Take 400 mg by mouth daily.    . metolazone (ZAROXOLYN) 5 MG tablet Take 5 mg by mouth 3 (three) times a week.   . ondansetron (ZOFRAN) 4 MG tablet Take 1 tablet (4 mg total) by mouth at bedtime.   . potassium chloride SA (K-DUR,KLOR-CON) 20 MEQ tablet Take 20 mEq by  mouth 3 (three) times daily.    . rifaximin (XIFAXAN) 550 MG TABS tablet Take 1 tablet (550 mg total) by mouth 2 (two) times daily.   Marland Kitchen spironolactone (ALDACTONE) 25 MG tablet Take 25 mg by mouth daily.    . vitamin B-12 (CYANOCOBALAMIN) 1000 MCG tablet Take 1,000 mcg by mouth daily.   . vitamin C (ASCORBIC ACID) 500 MG tablet Take 500 mg by mouth daily.    No facility-administered encounter medications on file as of 01/15/2017.     Fall Risk  01/15/2017 12/18/2016 11/22/2016 09/13/2016 09/06/2016  Falls in the past year? No Yes Yes Yes Yes  Number falls in past yr: - 1 1 1 1   Injury with Fall? - Yes Yes Yes Yes  Risk Factor Category  - High Fall Risk High Fall Risk - -  Risk for fall due to : Impaired mobility History of fall(s);Impaired balance/gait;Impaired mobility History of fall(s);Impaired balance/gait;Impaired mobility - -  Risk for fall due to (comments): weakness / hospice care - - - -  Follow up - Falls evaluation completed;Education provided;Falls prevention discussed Falls evaluation completed;Education provided;Falls prevention discussed - -    Preventives: Hearing: no issues  Eyes:  DM Eye Exam: negative retin 07/29/2016 - Ophthalmology:  Dr. Sallye Lat Dentist:  None Podiatrist: DM Foot Exam 07/11/2016  : Colonoscopy N/D Former smoker:  Quit 47 years ago.   Assessment  / Plan: Referral:  11/21/16  Source:  [facility] Utilization management Department Issue:  SNF discharge 11/19/16.  H/o [name], [facility]  unable to reach wife and left a brochure with patient in the facility.    H/o Patient with wounds; 2 pressure and 1 venous. Concerns regarding support from wife for patient and their needs.   H/o patient  discharged from [facility] 11/18/16 Patient now active with Hospice.   Telephonic Screening, Transition of Care and Initial Assessment 11/22/16 Telephonic RN CM Services:  11/22/16 - 01/15/17 Transition of Care:  11/22/16 - 12/18/16 Program:   Infection  Prevention.  11/22/16 - 01/15/17   [facility] Pharmacy Referral (11/22/2016 - 01/13/17) -Medication cost;  Dionisio Paschal -  RN CM advised [facility] will close to services due to patient enrolled in an external program:  Hospice.  [facility] notified of closure.  PCP notified via case closure letter.   Simmie Davies, BSN, RN, CCM  [facility] [employer] [facility] Care Management Coordinator [phone] Direct [phone] Cell [phone] Office [phone] Fax [email or web address]

## 2017-01-16 ENCOUNTER — Telehealth: Payer: Self-pay | Admitting: Surgery

## 2017-01-16 NOTE — Telephone Encounter (Signed)
01/16/2017 talked with the patients wife and she did not want to reschedule the le arterial ordered ordered by Dr Meyer RusselBritto since the patient has been placed in Hospice

## 2017-01-20 ENCOUNTER — Telehealth: Payer: Self-pay | Admitting: Physician Assistant

## 2017-01-20 NOTE — Telephone Encounter (Signed)
noted 

## 2017-01-21 ENCOUNTER — Encounter (HOSPITAL_BASED_OUTPATIENT_CLINIC_OR_DEPARTMENT_OTHER): Payer: PPO | Attending: Surgery

## 2017-01-21 DIAGNOSIS — I10 Essential (primary) hypertension: Secondary | ICD-10-CM | POA: Diagnosis not present

## 2017-01-21 DIAGNOSIS — S21202A Unspecified open wound of left back wall of thorax without penetration into thoracic cavity, initial encounter: Secondary | ICD-10-CM | POA: Insufficient documentation

## 2017-01-21 DIAGNOSIS — K7460 Unspecified cirrhosis of liver: Secondary | ICD-10-CM

## 2017-01-21 DIAGNOSIS — E1140 Type 2 diabetes mellitus with diabetic neuropathy, unspecified: Secondary | ICD-10-CM | POA: Insufficient documentation

## 2017-01-21 DIAGNOSIS — X58XXXA Exposure to other specified factors, initial encounter: Secondary | ICD-10-CM | POA: Insufficient documentation

## 2017-01-21 DIAGNOSIS — Z951 Presence of aortocoronary bypass graft: Secondary | ICD-10-CM | POA: Insufficient documentation

## 2017-01-21 DIAGNOSIS — I872 Venous insufficiency (chronic) (peripheral): Secondary | ICD-10-CM | POA: Diagnosis not present

## 2017-01-21 DIAGNOSIS — K7210 Chronic hepatic failure without coma: Secondary | ICD-10-CM | POA: Insufficient documentation

## 2017-01-21 DIAGNOSIS — I2510 Atherosclerotic heart disease of native coronary artery without angina pectoris: Secondary | ICD-10-CM | POA: Insufficient documentation

## 2017-01-21 DIAGNOSIS — L97312 Non-pressure chronic ulcer of right ankle with fat layer exposed: Secondary | ICD-10-CM | POA: Insufficient documentation

## 2017-01-21 DIAGNOSIS — I5082 Biventricular heart failure: Secondary | ICD-10-CM

## 2017-01-21 DIAGNOSIS — K7290 Hepatic failure, unspecified without coma: Secondary | ICD-10-CM

## 2017-01-21 DIAGNOSIS — K7031 Alcoholic cirrhosis of liver with ascites: Secondary | ICD-10-CM | POA: Diagnosis not present

## 2017-01-21 DIAGNOSIS — N183 Chronic kidney disease, stage 3 (moderate): Secondary | ICD-10-CM

## 2017-01-21 DIAGNOSIS — I482 Chronic atrial fibrillation: Secondary | ICD-10-CM

## 2017-01-21 DIAGNOSIS — L97821 Non-pressure chronic ulcer of other part of left lower leg limited to breakdown of skin: Secondary | ICD-10-CM | POA: Insufficient documentation

## 2017-01-28 ENCOUNTER — Other Ambulatory Visit: Payer: Self-pay | Admitting: Family Medicine

## 2017-01-29 ENCOUNTER — Telehealth: Payer: Self-pay | Admitting: Family Medicine

## 2017-01-29 ENCOUNTER — Other Ambulatory Visit: Payer: Self-pay | Admitting: *Deleted

## 2017-01-29 MED ORDER — GLUCOSE BLOOD VI STRP: ORAL_STRIP | 12 refills | Status: AC

## 2017-01-29 MED ORDER — INSULIN NPH ISOPHANE & REGULAR (70-30) 100 UNIT/ML ~~LOC~~ SUSP: SUBCUTANEOUS | 11 refills | Status: AC

## 2017-01-29 MED ORDER — LANCETS 30G MISC: 12 refills | Status: AC

## 2017-01-29 NOTE — Telephone Encounter (Signed)
Patient Name: [name] DOB: [birth year] Initial Comment Caller with [facility] and is needing a prescription called in for a pt. Not currently out or having symptoms. Nurse Assessment Nurse: Cox, RN, [employer] Date/Time (Eastern Time): 01/29/2017 3:22:29 PM Confirm and document reason for call. If symptomatic, describe symptoms. ---Wannetta Sender, with [facility], states patient has been getting 70/30 insulin, and the Rx is outdated, is from 2016. Requesting new Rx sent to [facility] [address] [place], [place] [address] Phone: [phone] Uses insulin twice daily, 6 units in am and pm. Pt checks BS twice daily. No symptoms. Does the patient have any new or worsening symptoms? ---No Guidelines Guideline Title Affirmed Question Affirmed Notes Final Disposition User Clinical Call Cox, RN, [employer] Comments Spoke with Herbert Seta at office backline regarding Rx request

## 2017-01-29 NOTE — Telephone Encounter (Signed)
Patient's wife calling to request a new script for diabetic supplies(lancets and test strips).  Pharmacy:  [facility] [address] [place] - [address] [phone] (Phone) [phone] (Fax)

## 2017-01-29 NOTE — Telephone Encounter (Signed)
[facility] nurse called stating that pt needed refill for Novolin 70/30 sent to [facility].  RF request for Novolin 70/30 LOV: 01/06/17 Next ov: 03/07/17 Last written: 12/06/16 #15mL w/ 11RF marked as no print.   Rx resent.

## 2017-01-29 NOTE — Telephone Encounter (Signed)
SW pts wife to find out how often pt is testing his blood sugar, he is testing twice a day. Advised pts wife that I will send Rx's for requested DM supplies. Rx's sent to [facility] on [address].

## 2017-02-04 DIAGNOSIS — S80812A Abrasion, left lower leg, initial encounter: Secondary | ICD-10-CM | POA: Diagnosis not present

## 2017-02-04 DIAGNOSIS — I872 Venous insufficiency (chronic) (peripheral): Secondary | ICD-10-CM | POA: Diagnosis not present

## 2017-02-04 DIAGNOSIS — S41002A Unspecified open wound of left shoulder, initial encounter: Secondary | ICD-10-CM | POA: Diagnosis not present

## 2017-02-04 DIAGNOSIS — L97312 Non-pressure chronic ulcer of right ankle with fat layer exposed: Secondary | ICD-10-CM | POA: Diagnosis not present

## 2017-02-11 ENCOUNTER — Ambulatory Visit: Payer: PPO | Admitting: Physician Assistant

## 2017-02-19 ENCOUNTER — Encounter (HOSPITAL_BASED_OUTPATIENT_CLINIC_OR_DEPARTMENT_OTHER): Payer: PPO

## 2017-03-06 ENCOUNTER — Telehealth: Payer: Self-pay | Admitting: Physician Assistant

## 2017-03-07 ENCOUNTER — Ambulatory Visit (INDEPENDENT_AMBULATORY_CARE_PROVIDER_SITE_OTHER): Payer: PPO | Admitting: Family Medicine

## 2017-03-07 ENCOUNTER — Encounter: Payer: Self-pay | Admitting: Family Medicine

## 2017-03-07 VITALS — BP 100/70 | HR 70 | Temp 97.5°F | Resp 16

## 2017-03-07 DIAGNOSIS — I130 Hypertensive heart and chronic kidney disease with heart failure and stage 1 through stage 4 chronic kidney disease, or unspecified chronic kidney disease: Secondary | ICD-10-CM

## 2017-03-07 DIAGNOSIS — K7460 Unspecified cirrhosis of liver: Secondary | ICD-10-CM | POA: Diagnosis not present

## 2017-03-07 DIAGNOSIS — R188 Other ascites: Secondary | ICD-10-CM

## 2017-03-07 DIAGNOSIS — R5381 Other malaise: Secondary | ICD-10-CM | POA: Diagnosis not present

## 2017-03-07 DIAGNOSIS — I5082 Biventricular heart failure: Secondary | ICD-10-CM

## 2017-03-07 DIAGNOSIS — E038 Other specified hypothyroidism: Secondary | ICD-10-CM

## 2017-03-07 DIAGNOSIS — R8290 Unspecified abnormal findings in urine: Secondary | ICD-10-CM

## 2017-03-07 DIAGNOSIS — D696 Thrombocytopenia, unspecified: Secondary | ICD-10-CM | POA: Diagnosis not present

## 2017-03-07 DIAGNOSIS — N183 Chronic kidney disease, stage 3 unspecified: Secondary | ICD-10-CM

## 2017-03-07 DIAGNOSIS — E118 Type 2 diabetes mellitus with unspecified complications: Secondary | ICD-10-CM

## 2017-03-07 DIAGNOSIS — Z794 Long term (current) use of insulin: Secondary | ICD-10-CM

## 2017-03-07 DIAGNOSIS — E039 Hypothyroidism, unspecified: Secondary | ICD-10-CM | POA: Diagnosis not present

## 2017-03-07 LAB — COMPREHENSIVE METABOLIC PANEL
ALBUMIN: 3.7 g/dL (ref 3.6–5.1)
ALT: 20 U/L (ref 9–46)
AST: 44 U/L — ABNORMAL HIGH (ref 10–35)
Alkaline Phosphatase: 172 U/L — ABNORMAL HIGH (ref 40–115)
BUN: 77 mg/dL — ABNORMAL HIGH (ref 7–25)
CHLORIDE: 92 mmol/L — AB (ref 98–110)
CO2: 18 mmol/L — ABNORMAL LOW (ref 20–31)
CREATININE: 1.72 mg/dL — AB (ref 0.70–1.18)
Calcium: 9.7 mg/dL (ref 8.6–10.3)
Glucose, Bld: 282 mg/dL — ABNORMAL HIGH (ref 65–99)
Potassium: 4.3 mmol/L (ref 3.5–5.3)
SODIUM: 128 mmol/L — AB (ref 135–146)
Total Bilirubin: 1.5 mg/dL — ABNORMAL HIGH (ref 0.2–1.2)
Total Protein: 8.6 g/dL — ABNORMAL HIGH (ref 6.1–8.1)

## 2017-03-07 LAB — POCT URINALYSIS DIPSTICK
BILIRUBIN UA: NEGATIVE
Glucose, UA: NEGATIVE
Ketones, UA: NEGATIVE
NITRITE UA: NEGATIVE
PH UA: 6 (ref 5.0–8.0)
Protein, UA: NEGATIVE
Spec Grav, UA: 1.01 (ref 1.010–1.025)
UROBILINOGEN UA: 0.2 U/dL

## 2017-03-07 LAB — CBC WITH DIFFERENTIAL/PLATELET
Basophils Absolute: 69 cells/uL (ref 0–200)
Basophils Relative: 1 %
EOS PCT: 4 %
Eosinophils Absolute: 276 cells/uL (ref 15–500)
HEMATOCRIT: 41.6 % (ref 38.5–50.0)
Hemoglobin: 13.8 g/dL (ref 13.2–17.1)
LYMPHS PCT: 13 %
Lymphs Abs: 897 cells/uL (ref 850–3900)
MCH: 31.2 pg (ref 27.0–33.0)
MCHC: 33.2 g/dL (ref 32.0–36.0)
MCV: 94.1 fL (ref 80.0–100.0)
MONOS PCT: 14 %
MPV: 10.2 fL (ref 7.5–12.5)
Monocytes Absolute: 966 cells/uL — ABNORMAL HIGH (ref 200–950)
NEUTROS PCT: 68 %
Neutro Abs: 4692 cells/uL (ref 1500–7800)
PLATELETS: 152 10*3/uL (ref 140–400)
RBC: 4.42 MIL/uL (ref 4.20–5.80)
RDW: 15.6 % — AB (ref 11.0–15.0)
WBC: 6.9 10*3/uL (ref 3.8–10.8)

## 2017-03-07 MED ORDER — CEPHALEXIN 500 MG PO CAPS: ORAL_CAPSULE | ORAL | 0 refills | Status: DC

## 2017-03-07 NOTE — Progress Notes (Signed)
OFFICE VISIT  03/07/2017   CC:  Chief Complaint  Patient presents with  . Follow-up    RCI,    HPI:    Patient is a 76 y.o. Caucasian male who presents accompanied by his wife for 2 mo f/u DM 2, cirrhosis with ascites, chronic biventricular CHF, CRI stage III, and subclinical hypothyroidism. After his o/v 2 mo ago we got him into hospice care.  I reviewed GI office not from 01/13/17: they started zofran 4mg  qhs, recommended increase in lactulose to tid, and were going to try to get him on xifaxan with medical assistance.  Given his level of debilitation and being in hospice care, GI recommended against any procedures such as EGD or colonoscopy.  Hospice nurse visits weekly, as does Child psychotherapist.   Pt is getting weaker and weaker--can hardly sit up on bed to eat. His wife can no longer take care of him.  She is going to get him into a NH next week.  He gets lactulose 45ml qd and has at least 3 decent BM's per day.  He occasionally gets a second dose if not having 3 BMS at least.   He was not able to get the xifaxan due to cost.  HTN: always 110 or less systolic.    DM: glucoses--fasting <150s, evenings he goes to 200-300.  Wife has increased morning 70/30 to 10 units and nighttime (6-8PM) dose to 10-15 U this has improved evening glucoses to 150s range. No hypoglycemia. He eats breakfast.  He won't eat lunch but will eat a snack or glucerna at that time.  He eats supper.  CHF: no SOB, no cough.  No signif swelling of abdomen or LE's.  He last saw Dr. Algie Coffer about 7 wks ago and has f/u in a couple of weeks.  He does have pretty persistent nausea: he is getting zofran 4mg  alternating with compazine (rx'd by hospice MD). He seems to get the nausea worse when his umbilical hernia pops out.  Wife says urine smells foul and appears dark yellow.  She says he drinks clear fluids well.  Pt denies dysuria.   Past Medical History:  Diagnosis Date  . Atrial fibrillation (HCC)    ?GI bleed  in past, plus pt tends to "self adjust/manage" his meds w/out regard for MD's guidance, so he has NOT been on anticoagulant for this.  . CHF (congestive heart failure) (HCC)   . Chronic bronchitis (HCC)    "used to get it often; haven't had it for awhile now" (04/12/2013)  . Chronic renal insufficiency, stage III (moderate)    GFR 40s  . Chronic systolic heart failure (HCC)   . 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  Hospitalized with hepatic encephalopathy 09/2016.  Marland Kitchen Coronary artery disease   . Gout   . Heart murmur   . High cholesterol   . Hypertension   . Patient on full hospice care    admitted to home hospice care 01/10/17.  . Pneumonia   . Positive TB test    "but I don't have it"  . Postpolio syndrome 1951   "affected the right side of my body; been weak on that side ever since" (04/12/2013)  . 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 Dione Booze)  . Pulmonary embolism (HCC) 1980's or 1990's  . Severe mitral regurgitation 2013  . Severe tricuspid regurgitation 2013  .  Subclinical hypothyroidism   . Type II diabetes mellitus (HCC)   . Umbilical hernia 02/2015   Symptomatic--Dr. Corliss Skains to repair if cleared by cardiologist    Past Surgical History:  Procedure Laterality Date  . APPENDECTOMY  ~ 1962  . CARDIAC CATHETERIZATION     "I've had a few since 1989" (04/12/2013)  . CARDIAC CATHETERIZATION  12/2012   Severe native CAD, patent bypass grafts x 3, EF 25-30%--med mgmt  . CATARACT EXTRACTION W/ INTRAOCULAR LENS  IMPLANT, BILATERAL Bilateral 2005  . CERVICAL DISCECTOMY  1984   C6-7  . CORONARY ARTERY BYPASS GRAFT  01/1988; 05/1999   CABG X 3; CABG X 2  . EXPLORATORY LAPAROTOMY  ~ 1962   "took out my appendix while there; bowel was inflammed" (04/12/2013)  . HYDROCELE EXCISION / REPAIR     "couple times" (04/12/2013)  . LEFT AND RIGHT HEART CATHETERIZATION  WITH CORONARY/GRAFT ANGIOGRAM  01/13/2013   Procedure: LEFT AND RIGHT HEART CATHETERIZATION WITH Isabel Caprice;  Surgeon: [name], MD;  Location: [facility] CATH [facility];  Service: Cardiovascular;;  . PARACENTESIS  2013-2014   Multiple times, usually pulling off 6-7 liters of fluid  . RETINAL LASER PROCEDURE Bilateral    "more than once on the left" (04/12/2013)  . TRANSTHORACIC ECHOCARDIOGRAM  2013;12/2014;01/2016   LV dilation, EF 30-35%, diffuse hypokinesis, grade I diast dysfxn, severe mitral and tricuspid regurg, LAE and RAE--biventricular dysfunction moderate/severe. 01/2016- EF 20-25%, dilated LV with akinesis/hypokinesis diffusely, severe RV dysfxn, severe mitral regurg, severe PA HTN    Outpatient Medications Prior to Visit  Medication Sig Dispense Refill  . calcium carbonate (OSCAL) 1500 (600 Ca) MG TABS tablet Take 600 mg of elemental calcium by mouth daily.     . carvedilol (COREG) 3.125 MG tablet Take 1 tablet (3.125 mg total) by mouth 2 (two) times daily with a meal.    . cholecalciferol (VITAMIN D) 1000 units tablet Take 1,000 Units by mouth daily.    . famotidine (PEPCID) 20 MG tablet Take 20 mg by mouth 2 (two) times daily.    . furosemide (LASIX) 20 MG tablet Take 1 tablet (20 mg total) by mouth 2 (two) times daily. 30 tablet 0  . glucose blood test strip Use to check blood sugar twice a day 100 each 12  . insulin NPH-regular Human (NOVOLIN 70/30) (70-30) 100 UNIT/ML injection 9 U SQ with BF and 6 U SQ with Supper 10 mL 11  . LACTULOSE PO Take 45 mLs by mouth 2 (two) times daily. Then give 30 ml at bedtime.    . Lancets 30G MISC Use to check blood sugar twice a day 100 each 12  . magnesium oxide (MAG-OX) 400 (241.3 Mg) MG tablet Take 400 mg by mouth daily.     . metolazone (ZAROXOLYN) 5 MG tablet Take 5 mg by mouth 3 (three) times a week.    . ondansetron (ZOFRAN) 4 MG tablet Take 1 tablet (4 mg total) by mouth at bedtime. 20 tablet 1  . potassium chloride SA  (K-DUR,KLOR-CON) 20 MEQ tablet Take 20 mEq by mouth 3 (three) times daily.     Marland Kitchen RELION INSULIN SYR 0.3ML/31G 31G X 5/16" 0.3 ML MISC USE  TWICE DAILY ALONG WITH INSULIN 100 each 11  . spironolactone (ALDACTONE) 25 MG tablet Take 25 mg by mouth daily.     . vitamin B-12 (CYANOCOBALAMIN) 1000 MCG tablet Take 1,000 mcg by mouth daily.    . vitamin C (ASCORBIC ACID) 500 MG tablet Take  500 mg by mouth daily.    . rifaximin (XIFAXAN) 550 MG TABS tablet Take 1 tablet (550 mg total) by mouth 2 (two) times daily. (Patient not taking: Reported on 03/07/2017) 60 tablet 5   No facility-administered medications prior to visit.     Allergies  Allergen Reactions  . Adhesive [Tape] Other (See Comments)    Reaction:  Pulls pts skin off   . Sulfa Antibiotics Rash    ROS As per HPI  ZO:XWRUEAV bp 186/67 Blood pressure 100/70, pulse 70, temperature 97.5 F (36.4 C), temperature source Oral, resp. rate 16, SpO2 98 %. Gen: thin, chronically ill-appearing,alert and answers when spoken to, but slumps his head over and dozes when not being spoken to. Oriented to person, place, situation.  He says it is 2018 but doesn't know month or day.  Says Garnet Koyanagi is the president. Says he watches news on TV and some TV shows but often falls asleep during these.  Does not read the paper. WUJ:WJXB: no injection, icteris, swelling, or exudate.  EOMI, PERRLA. Mouth: lips without lesion/swelling.  Oral mucosa pink and moist. Oropharynx without erythema, exudate, or swelling.  CV: RRR, harsh 4/6 systolic murmur.  No diastolic murmur.  No rub or gallop. ABD: soft, NT/ND. EXT: no clubbing, cyanosis, or edema.  SKIN: anterior tibial surfaces with bronzing erythema that is non-tender and the skin is intact. Multiple bruises on arms.  LABS:  Lab Results  Component Value Date   TSH 14.29 (H) 11/25/2016   Lab Results  Component Value Date   WBC 5.0 11/25/2016   HGB 14.3 11/25/2016   HCT 42.7 11/25/2016   MCV 92.7  11/25/2016   PLT 119.0 (L) 11/25/2016   Lab Results  Component Value Date   CREATININE 1.51 (H) 01/06/2017   BUN 83 (H) 01/06/2017   NA 129 (L) 01/06/2017   K 3.9 01/06/2017   CL 94 (L) 01/06/2017   CO2 24 01/06/2017   Lab Results  Component Value Date   ALT 25 01/06/2017   AST 35 01/06/2017   ALKPHOS 199 (H) 01/06/2017   BILITOT 1.4 (H) 01/06/2017   Lab Results  Component Value Date   CHOL 198 07/04/2014   Lab Results  Component Value Date   HDL 40.40 07/04/2014   Lab Results  Component Value Date   LDLCALC 139 (H) 07/04/2014   Lab Results  Component Value Date   TRIG 95.0 07/04/2014   Lab Results  Component Value Date   CHOLHDL 5 07/04/2014   Lab Results  Component Value Date   HGBA1C 7.4 (H) 11/25/2016   CC UA today:   IMPRESSION AND PLAN:  1) DM 2, wife adjusting his 70/30 insulins to get morning and evening glucoses around 150. No hypoglycemia. Check HbA1c today.  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 Pt cannot afford xifaxan.  Continue lactulose as tolerated to get a goal of 3 BMs per day minimum. Continue 25mg  spironolactone daily, lasix 40 mg bid, and metolazone 5mg  three times a week. Check CMET today.  3) Chronic biventricular CHF: no sign of volume overload.  In fact, if renal function significantly deteriorated since last check, would back off on his diuretic some.  4) Subclinical hypothyroidism vs sick thyroid syndrome: recheck TSH, T4, and T3.  5) Foul urine odor: UA today with trace intact blood and small LEU. Start keflex 500 mg bid x 4d. Urine sent for c/s.  Spent  55 min with pt today, with >50% of this time spent in counseling and care coordination regarding the above problems.  An After Visit Summary was printed and given to the patient.  FOLLOW UP: Return in about 2 months (around 05/07/2017) for routine chronic illness f/u (30 min).  Signed:  Santiago BumpersPhil Noelene Gang, MD            03/07/2017

## 2017-03-08 LAB — HEMOGLOBIN A1C
Hgb A1c MFr Bld: 7.9 % — ABNORMAL HIGH (ref <5.7)
MEAN PLASMA GLUCOSE: 180 mg/dL

## 2017-03-08 LAB — T4, FREE: FREE T4: 1.4 ng/dL (ref 0.8–1.8)

## 2017-03-08 LAB — TSH: TSH: 7.21 m[IU]/L — AB (ref 0.40–4.50)

## 2017-03-08 LAB — T3: T3 TOTAL: 72 ng/dL — AB (ref 76–181)

## 2017-03-10 ENCOUNTER — Other Ambulatory Visit: Payer: Self-pay | Admitting: *Deleted

## 2017-03-10 LAB — URINE CULTURE

## 2017-03-10 MED ORDER — CIPROFLOXACIN HCL 250 MG PO TABS: 250.0000 mg | ORAL_TABLET | Freq: Two times a day (BID) | ORAL | 0 refills | Status: AC

## 2017-03-10 NOTE — Telephone Encounter (Signed)
Francisco FlakesJennifer FYI [facility] or myself has not been able to reach the pt regarding xifaxan.

## 2017-03-13 ENCOUNTER — Telehealth: Payer: Self-pay | Admitting: *Deleted

## 2017-03-13 NOTE — Telephone Encounter (Signed)
Arloa KohFYI.  Tonya with Hospice called stating that they will be faxing over an order for [facility] to [facility].  Dates: 03/17/17-04/19/17 Pt will be staying for 5 nights so his wife can get some rest, then he will come home.

## 2017-03-13 NOTE — Telephone Encounter (Signed)
Noted  

## 2017-03-24 ENCOUNTER — Telehealth: Payer: Self-pay | Admitting: Family Medicine

## 2017-03-24 NOTE — Telephone Encounter (Signed)
[name] [facility] calling to report patient passed away on 04/06/2017 at 9:46am [facility].

## 2017-03-24 NOTE — Telephone Encounter (Signed)
Can we start a card for him?-thx

## 2017-03-24 NOTE — Telephone Encounter (Signed)
FYI

## 2017-04-20 DEATH — deceased

## 2017-05-07 ENCOUNTER — Ambulatory Visit: Payer: PPO | Admitting: Family Medicine

## 2017-09-12 IMAGING — CT CT HEAD W/O CM
4 series · 15 of 47 positions shown, 17 images · non-contrast
Comparison: None.

CLINICAL DATA: Recent fall.  Unsteady gait.

EXAM:
CT HEAD WITHOUT CONTRAST
TECHNIQUE: Contiguous axial images were obtained from the base of the skull
through the vertex without intravenous contrast.

[Series 2: head wo · axial · 0.43mm/px · z∈[+8,+113]mm · 7 of 29 slices shown, 9 images]
[im 4/29  brain]
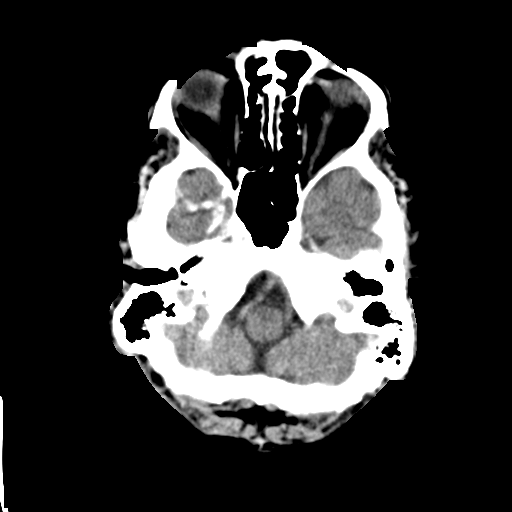
[im 4/29  bone]
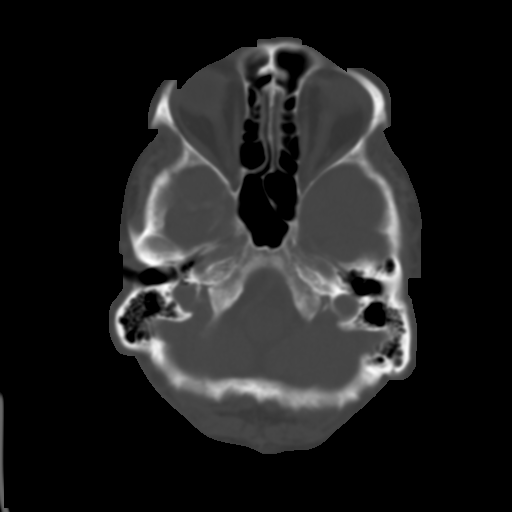
[im 8/29  brain]
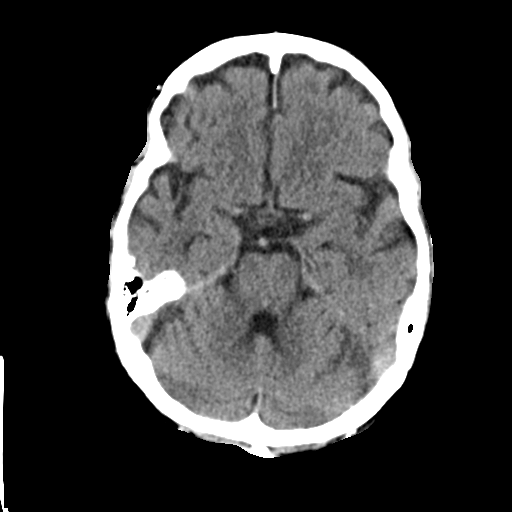
[im 11/29  brain]
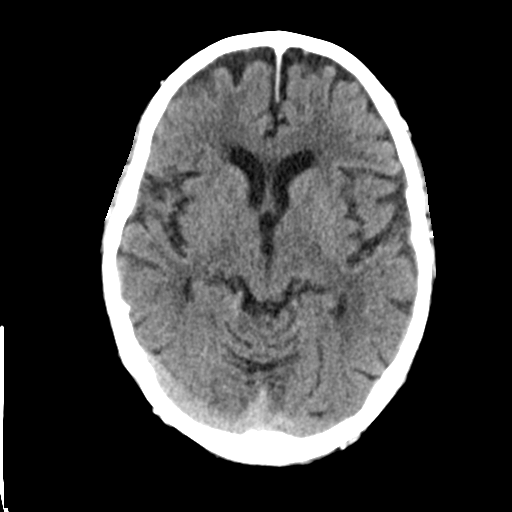
[im 15/29  brain]
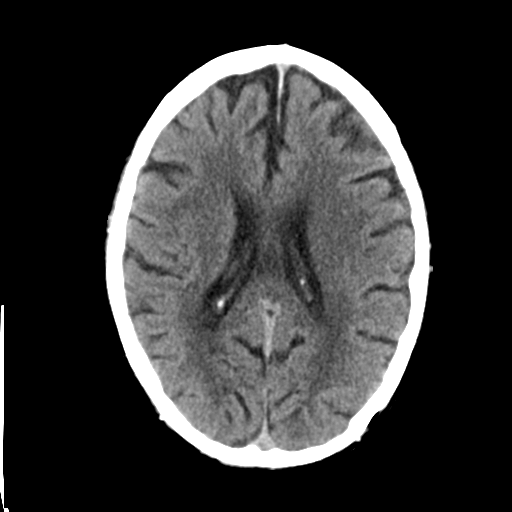
[im 18/29  brain]
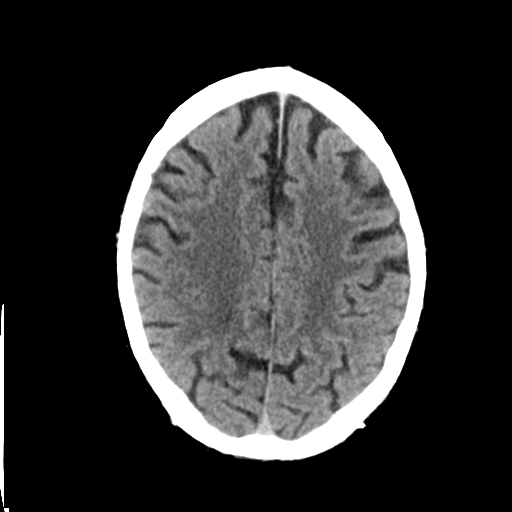
[im 18/29  bone]
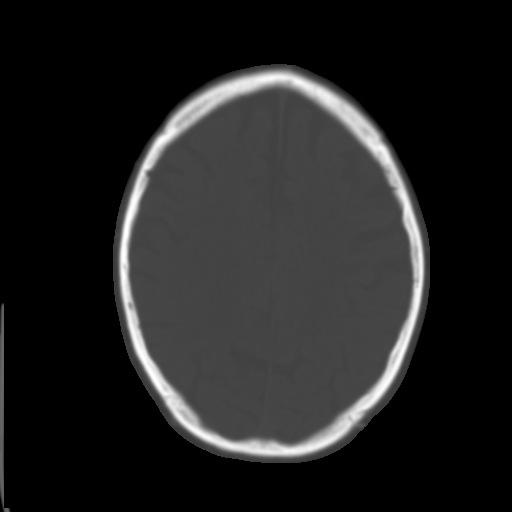
[im 22/29  brain]
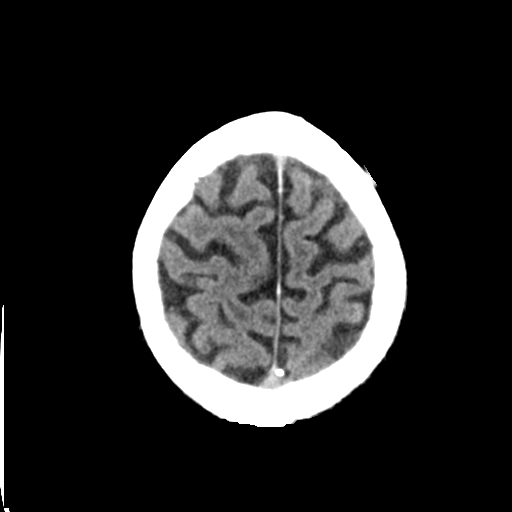
[im 25/29  brain]
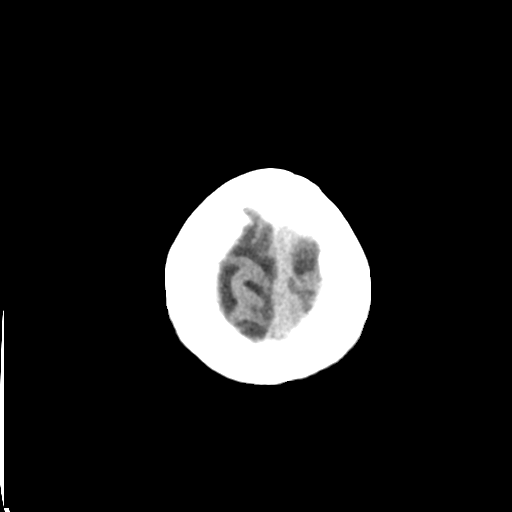

[Series 3: head bone · axial · 0.43mm/px · z∈[+7,+21]mm · 2 of 73 slices shown]
[im 8/73  bone]
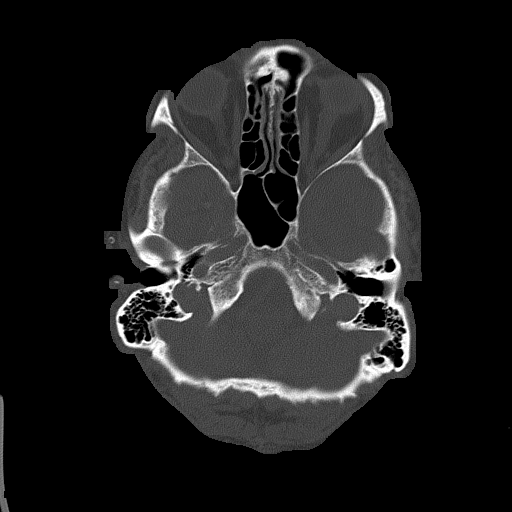
[im 15/73  bone]
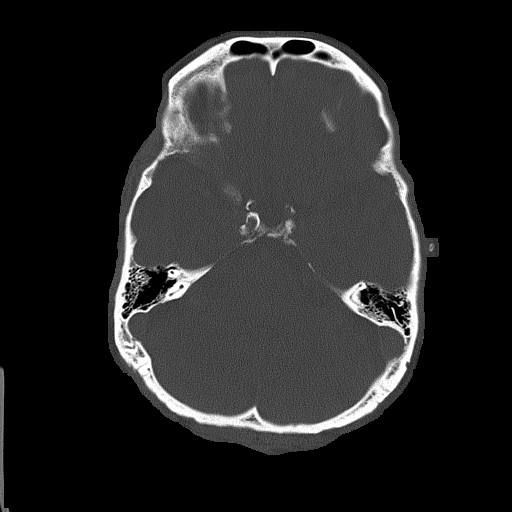

[Series 4: coronal soft tissue · coronal · 0.29mm/px · 3 of 71 slices shown]
[im 24/71  brain]
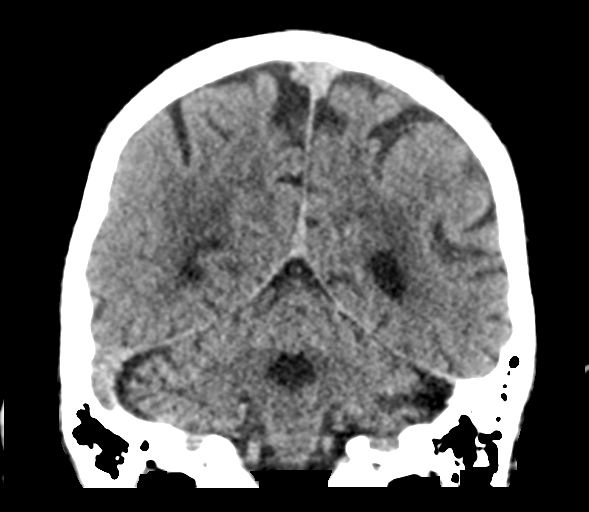
[im 32/71  brain]
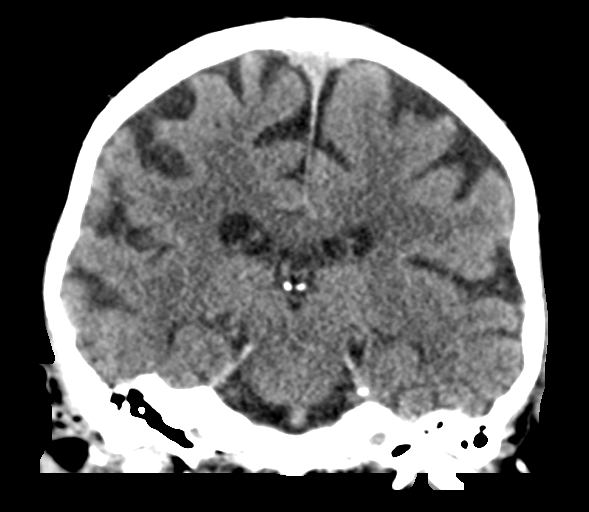
[im 39/71  brain]
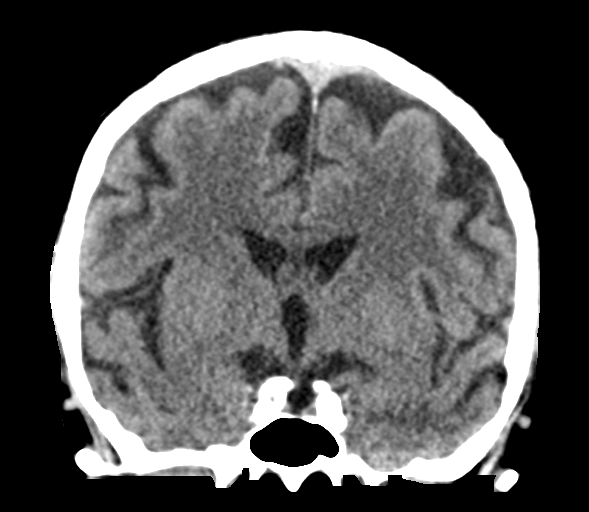

[Series 5: sagittal soft tissue · sagittal · 0.29mm/px · 3 of 66 slices shown]
[im 22/66  brain]
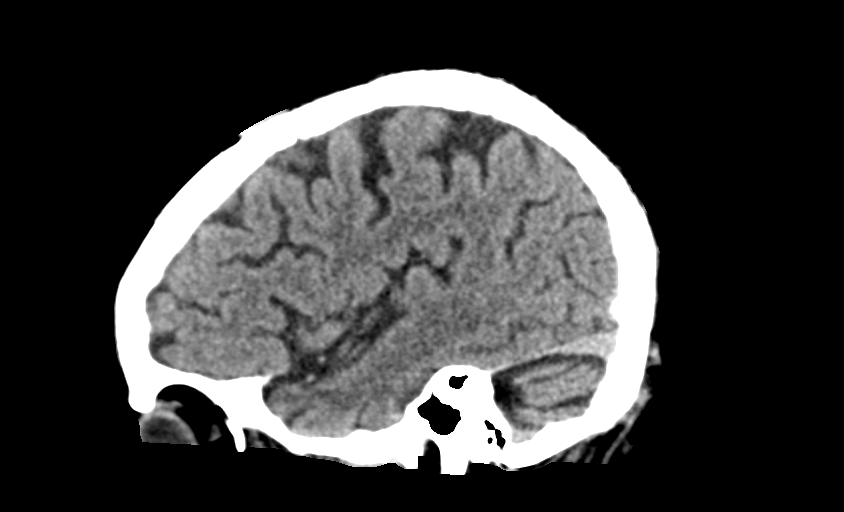
[im 33/66  brain]
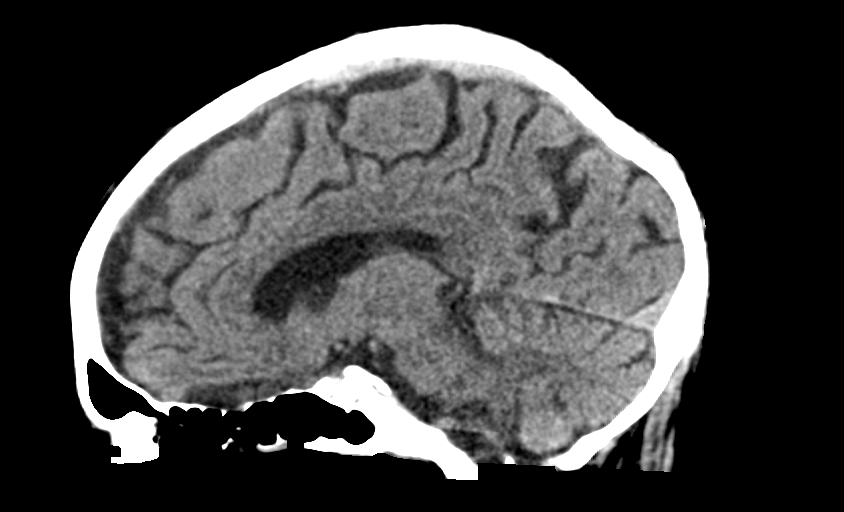
[im 44/66  brain]
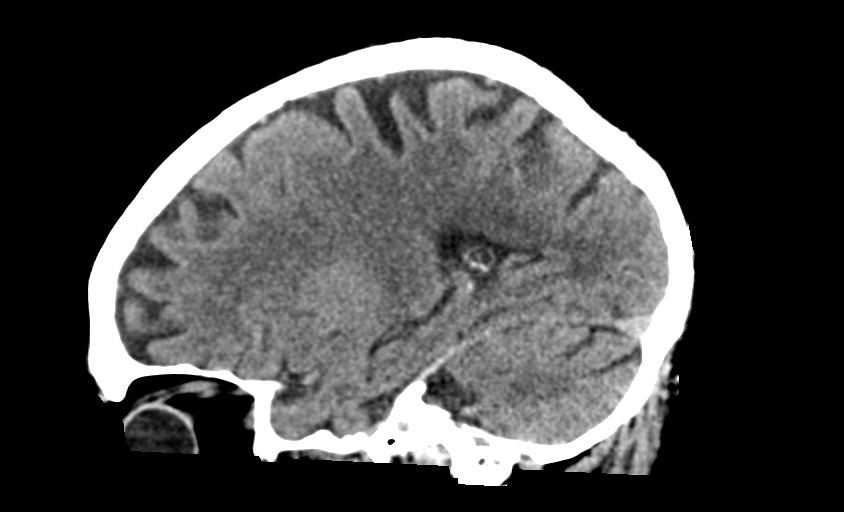

[15 of 47 positions shown; findings below may reference images not displayed]

FINDINGS: Brain: Cortical atrophy and chronic microvascular ischemic change
are identified. No acute intracranial abnormality including
hemorrhage, infarct, mass lesion, mass effect, midline shift or
abnormal extra-axial fluid collection. No hydrocephalus or
pneumocephalus.

Vascular: Extensive atherosclerotic vascular disease is present.

Skull: Intact.  No focal lesion.

Sinuses/Orbits: Negative.

Other: Scalp calcifications on the left are incidentally noted and
likely due to prior infectious or inflammatory process. The patient
also has a cystic lesion in the scalp over the left side of the
cranium near the vertex most consistent with a sebaceous cyst.
IMPRESSION: No acute abnormality.

Atrophy and chronic microvascular ischemic change.

Atherosclerosis.

## 2017-12-04 IMAGING — DX DG ANKLE COMPLETE 3+V*R*
3 series · 3 of 3 positions shown · non-contrast
Comparison: Right ankle films of 04/28/2014

CLINICAL DATA: Nonhealing wound along the lateral aspect of the
right ankle, history of diabetes

EXAM:
RIGHT ANKLE - COMPLETE 3+ VIEW

[ankle ap]
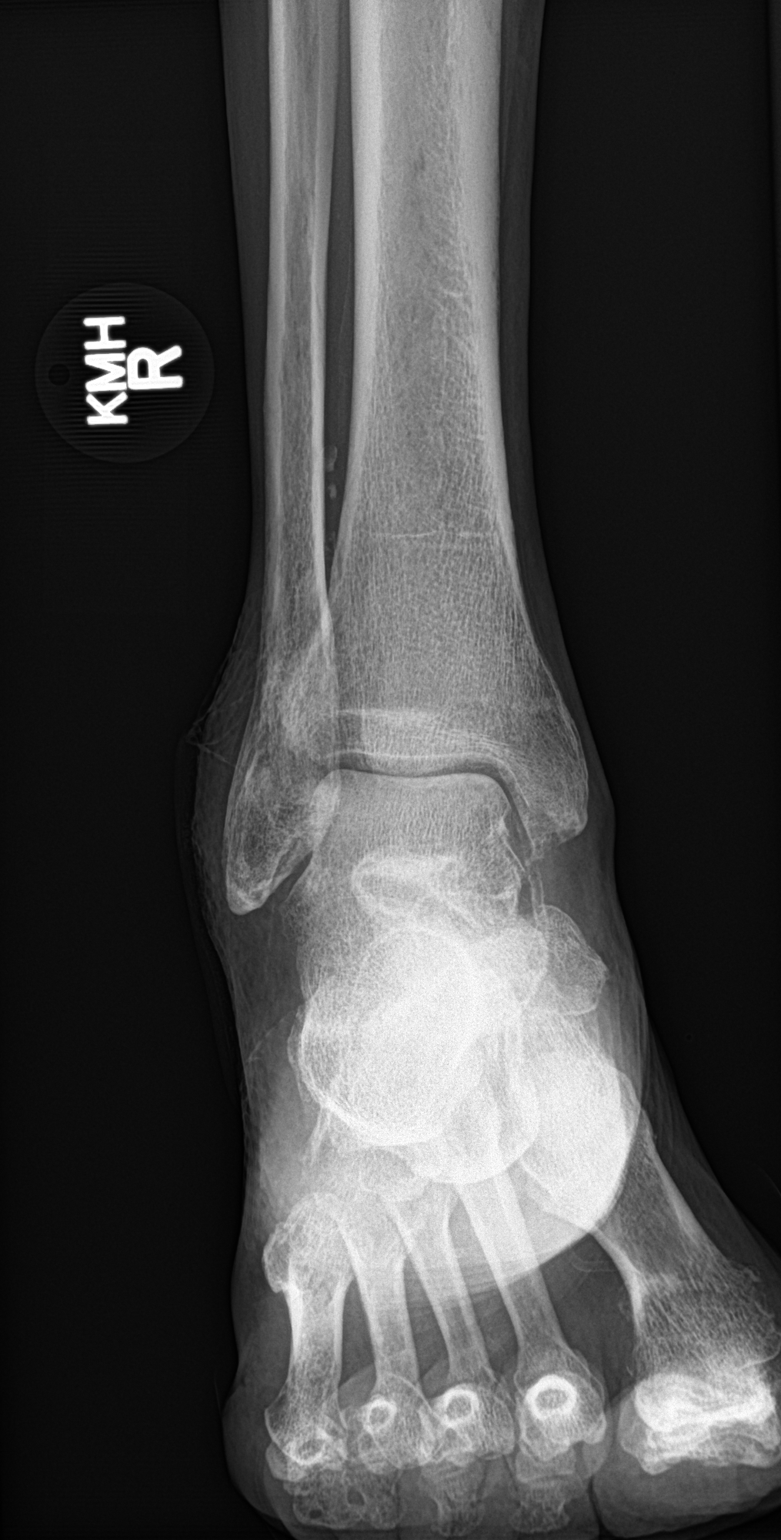

[ankle obl]
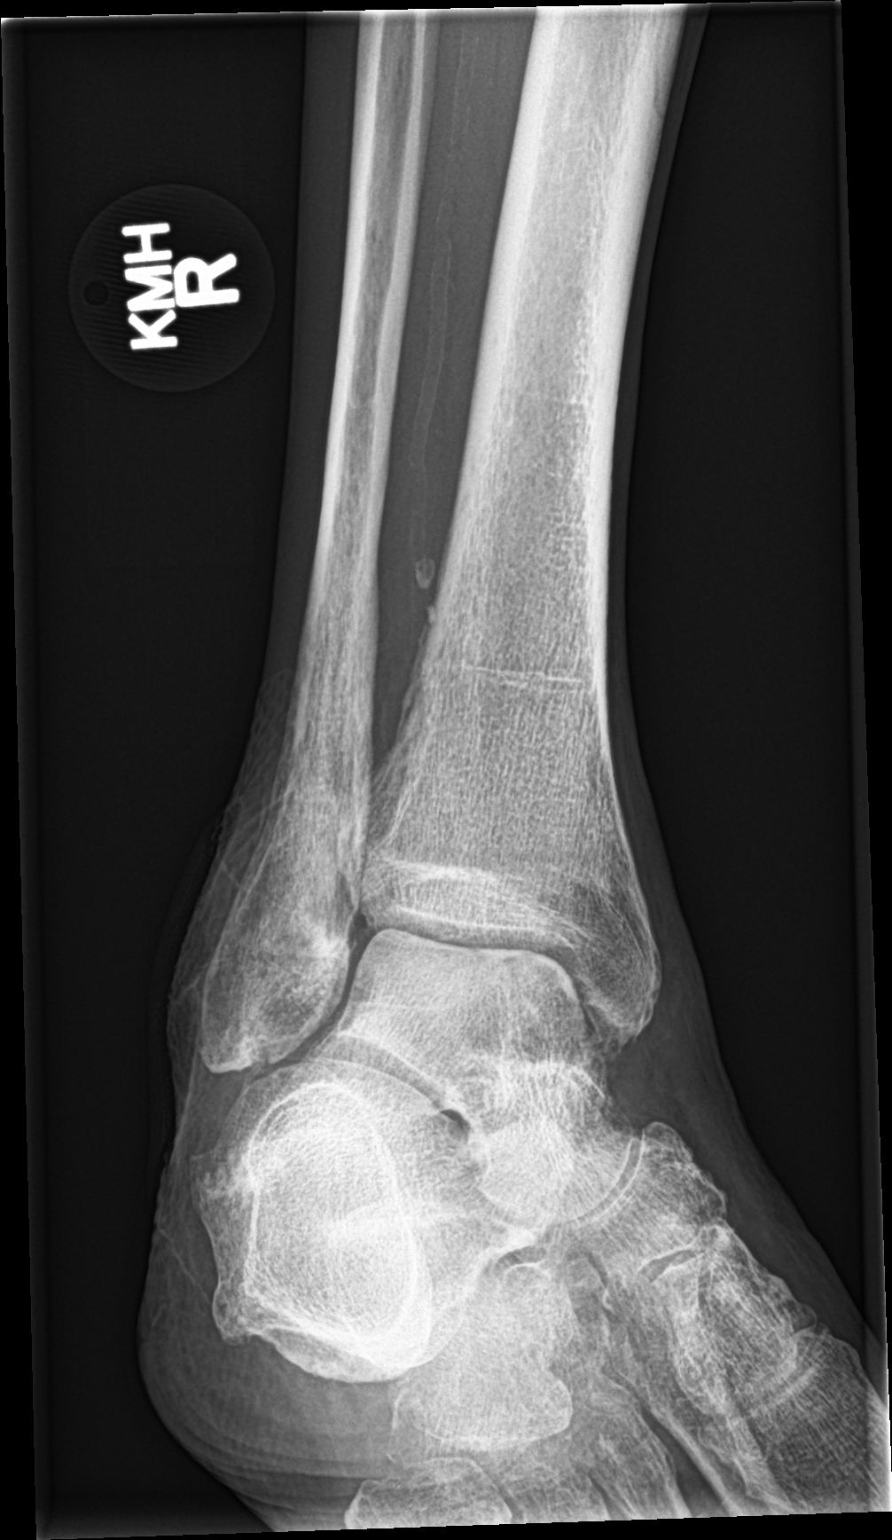

[ankle lat]
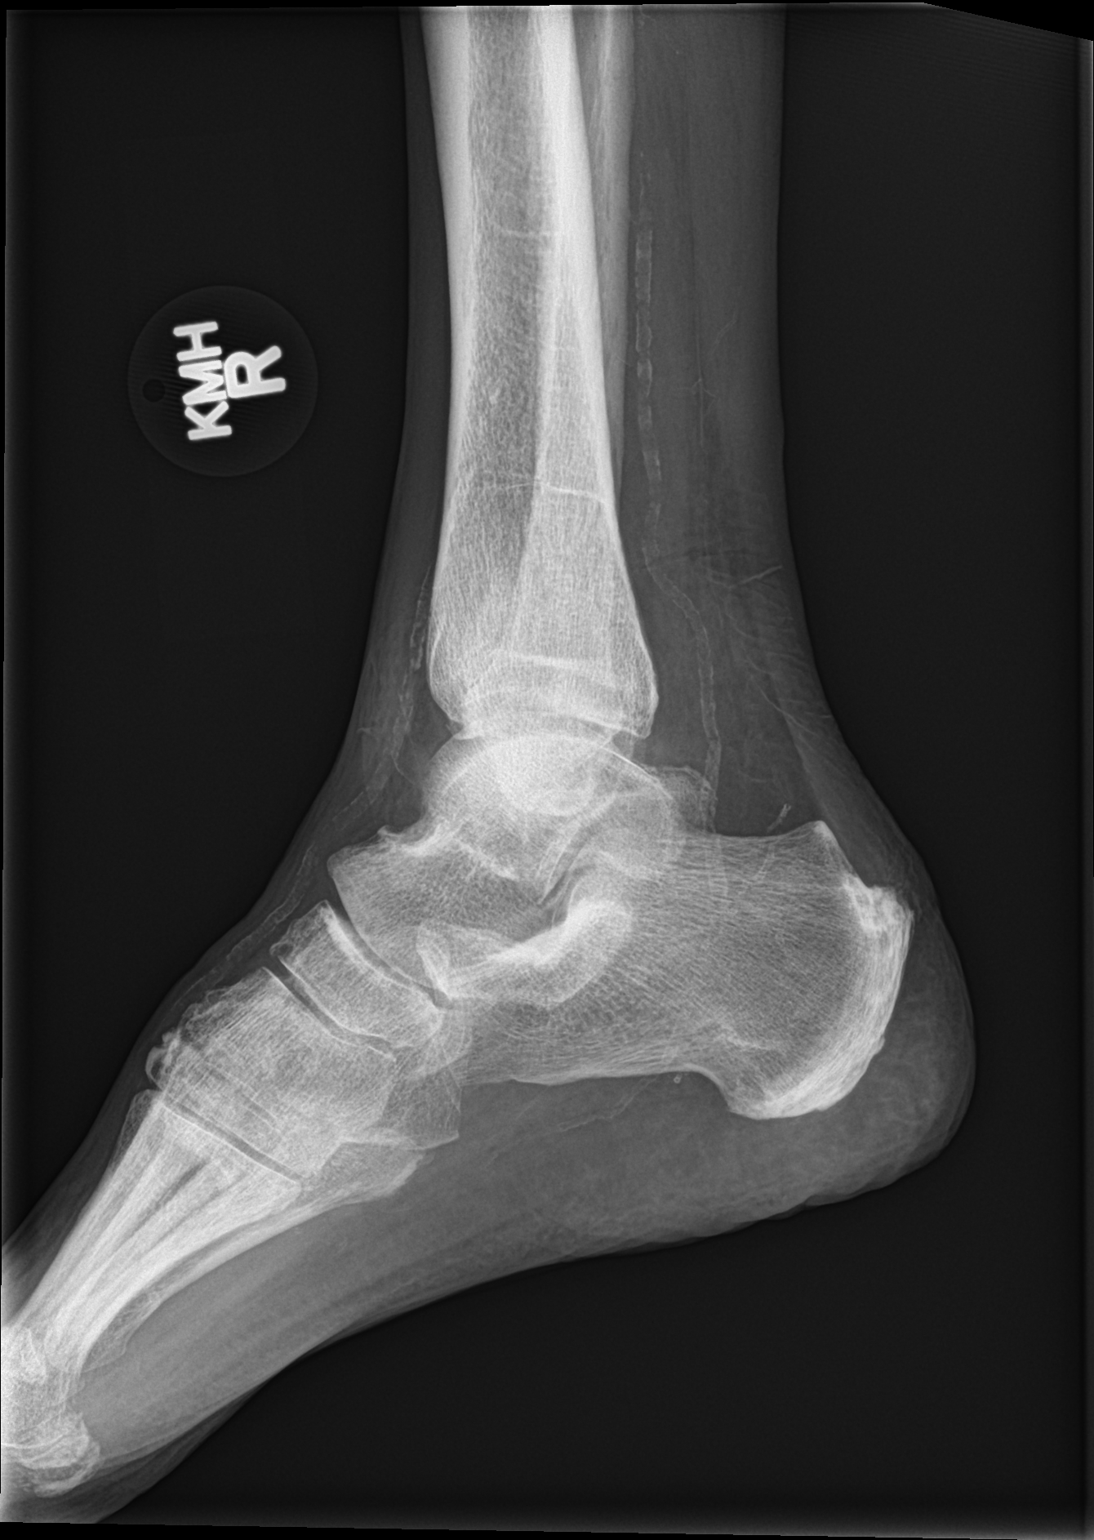

[3 of 3 positions shown; findings below may reference images not displayed]

FINDINGS: The bones are somewhat osteopenic. However, no definite radiographic
evidence of osteomyelitis is noted. There is slight soft tissue
swelling over the lateral malleolus. Lucencies within the medial
malleolus most likely are degenerative in origin. The ankle joint
appears normal. Alignment is normal. Diffuse arterial calcifications
are present.
IMPRESSION: 1. No definite evidence of active osteomyelitis.
2. Osteopenia and diffuse arterial calcifications.
